# Patient Record
Sex: Female | Born: 1935 | Race: White | Hispanic: No | Smoking: Never smoker
Health system: Southern US, Community
[De-identification: ages and names within clinical notes are randomized; demographics above are authoritative.]

## PROBLEM LIST (undated history)

## (undated) DIAGNOSIS — I10 Essential (primary) hypertension: Secondary | ICD-10-CM

## (undated) DIAGNOSIS — M549 Dorsalgia, unspecified: Secondary | ICD-10-CM

## (undated) DIAGNOSIS — M1990 Unspecified osteoarthritis, unspecified site: Secondary | ICD-10-CM

## (undated) DIAGNOSIS — E7800 Pure hypercholesterolemia, unspecified: Secondary | ICD-10-CM

## (undated) DIAGNOSIS — G8929 Other chronic pain: Secondary | ICD-10-CM

## (undated) DIAGNOSIS — R112 Nausea with vomiting, unspecified: Secondary | ICD-10-CM

## (undated) DIAGNOSIS — I639 Cerebral infarction, unspecified: Secondary | ICD-10-CM

## (undated) DIAGNOSIS — Z87442 Personal history of urinary calculi: Secondary | ICD-10-CM

## (undated) DIAGNOSIS — I447 Left bundle-branch block, unspecified: Secondary | ICD-10-CM

## (undated) DIAGNOSIS — R011 Cardiac murmur, unspecified: Secondary | ICD-10-CM

## (undated) DIAGNOSIS — J189 Pneumonia, unspecified organism: Secondary | ICD-10-CM

## (undated) DIAGNOSIS — C50912 Malignant neoplasm of unspecified site of left female breast: Secondary | ICD-10-CM

## (undated) DIAGNOSIS — I509 Heart failure, unspecified: Secondary | ICD-10-CM

## (undated) DIAGNOSIS — Z9889 Other specified postprocedural states: Secondary | ICD-10-CM

## (undated) HISTORY — PX: CATARACT EXTRACTION W/ INTRAOCULAR LENS IMPLANT: SHX1309

## (undated) HISTORY — PX: BACK SURGERY: SHX140

## (undated) HISTORY — PX: APPENDECTOMY: SHX54

---

## 1990-03-18 DIAGNOSIS — C50912 Malignant neoplasm of unspecified site of left female breast: Secondary | ICD-10-CM

## 1990-03-18 HISTORY — PX: MASTECTOMY: SHX3

## 1990-03-18 HISTORY — DX: Malignant neoplasm of unspecified site of left female breast: C50.912

## 1998-02-07 ENCOUNTER — Other Ambulatory Visit: Admission: RE | Admit: 1998-02-07 | Discharge: 1998-02-07 | Payer: Self-pay | Admitting: Internal Medicine

## 1998-10-16 ENCOUNTER — Encounter: Payer: Self-pay | Admitting: Internal Medicine

## 1998-10-16 ENCOUNTER — Ambulatory Visit (HOSPITAL_COMMUNITY): Admission: RE | Admit: 1998-10-16 | Discharge: 1998-10-16 | Payer: Self-pay | Admitting: Internal Medicine

## 1999-05-14 ENCOUNTER — Encounter: Admission: RE | Admit: 1999-05-14 | Discharge: 1999-05-14 | Payer: Self-pay | Admitting: *Deleted

## 2000-04-11 ENCOUNTER — Other Ambulatory Visit: Admission: RE | Admit: 2000-04-11 | Discharge: 2000-04-11 | Payer: Self-pay | Admitting: *Deleted

## 2000-05-20 ENCOUNTER — Encounter: Admission: RE | Admit: 2000-05-20 | Discharge: 2000-05-20 | Payer: Self-pay | Admitting: *Deleted

## 2001-06-09 ENCOUNTER — Encounter: Admission: RE | Admit: 2001-06-09 | Discharge: 2001-06-09 | Payer: Self-pay | Admitting: *Deleted

## 2002-06-11 ENCOUNTER — Encounter: Admission: RE | Admit: 2002-06-11 | Discharge: 2002-06-11 | Payer: Self-pay | Admitting: Internal Medicine

## 2002-06-11 ENCOUNTER — Encounter: Payer: Self-pay | Admitting: Internal Medicine

## 2003-06-28 ENCOUNTER — Encounter: Admission: RE | Admit: 2003-06-28 | Discharge: 2003-06-28 | Payer: Self-pay | Admitting: Internal Medicine

## 2004-02-22 ENCOUNTER — Ambulatory Visit (HOSPITAL_COMMUNITY): Admission: RE | Admit: 2004-02-22 | Discharge: 2004-02-22 | Payer: Self-pay | Admitting: Cardiology

## 2006-07-02 ENCOUNTER — Encounter: Admission: RE | Admit: 2006-07-02 | Discharge: 2006-07-02 | Payer: Self-pay | Admitting: Internal Medicine

## 2007-03-17 ENCOUNTER — Encounter: Admission: RE | Admit: 2007-03-17 | Discharge: 2007-03-17 | Payer: Self-pay | Admitting: Internal Medicine

## 2007-10-02 ENCOUNTER — Encounter: Admission: RE | Admit: 2007-10-02 | Discharge: 2007-10-02 | Payer: Self-pay | Admitting: Internal Medicine

## 2008-07-11 ENCOUNTER — Encounter: Admission: RE | Admit: 2008-07-11 | Discharge: 2008-07-11 | Payer: Self-pay | Admitting: Internal Medicine

## 2008-07-20 ENCOUNTER — Encounter: Admission: RE | Admit: 2008-07-20 | Discharge: 2008-07-20 | Payer: Self-pay | Admitting: Internal Medicine

## 2008-11-17 ENCOUNTER — Encounter: Admission: RE | Admit: 2008-11-17 | Discharge: 2008-11-17 | Payer: Self-pay | Admitting: Internal Medicine

## 2009-02-02 ENCOUNTER — Encounter: Admission: RE | Admit: 2009-02-02 | Discharge: 2009-02-02 | Payer: Self-pay | Admitting: Internal Medicine

## 2010-04-09 ENCOUNTER — Encounter: Payer: Self-pay | Admitting: Internal Medicine

## 2010-05-23 ENCOUNTER — Other Ambulatory Visit: Payer: Self-pay | Admitting: Internal Medicine

## 2010-05-23 DIAGNOSIS — Z9010 Acquired absence of unspecified breast and nipple: Secondary | ICD-10-CM

## 2010-05-23 DIAGNOSIS — Z1231 Encounter for screening mammogram for malignant neoplasm of breast: Secondary | ICD-10-CM

## 2010-05-29 ENCOUNTER — Ambulatory Visit
Admission: RE | Admit: 2010-05-29 | Discharge: 2010-05-29 | Disposition: A | Discharge status: Home / Self Care | Payer: Medicare Other | Source: Ambulatory Visit | Attending: Internal Medicine | Admitting: Internal Medicine

## 2010-05-29 DIAGNOSIS — Z9010 Acquired absence of unspecified breast and nipple: Secondary | ICD-10-CM

## 2010-05-29 DIAGNOSIS — Z1231 Encounter for screening mammogram for malignant neoplasm of breast: Secondary | ICD-10-CM

## 2011-05-15 DIAGNOSIS — I10 Essential (primary) hypertension: Secondary | ICD-10-CM | POA: Diagnosis not present

## 2011-05-15 DIAGNOSIS — E782 Mixed hyperlipidemia: Secondary | ICD-10-CM | POA: Diagnosis not present

## 2011-05-20 ENCOUNTER — Other Ambulatory Visit: Payer: Self-pay | Admitting: Internal Medicine

## 2011-05-20 DIAGNOSIS — Z1231 Encounter for screening mammogram for malignant neoplasm of breast: Secondary | ICD-10-CM

## 2011-05-20 DIAGNOSIS — Z9012 Acquired absence of left breast and nipple: Secondary | ICD-10-CM

## 2011-05-21 DIAGNOSIS — E782 Mixed hyperlipidemia: Secondary | ICD-10-CM | POA: Diagnosis not present

## 2011-05-21 DIAGNOSIS — IMO0002 Reserved for concepts with insufficient information to code with codable children: Secondary | ICD-10-CM | POA: Diagnosis not present

## 2011-05-21 DIAGNOSIS — I10 Essential (primary) hypertension: Secondary | ICD-10-CM | POA: Diagnosis not present

## 2011-11-19 DIAGNOSIS — I10 Essential (primary) hypertension: Secondary | ICD-10-CM | POA: Diagnosis not present

## 2011-11-19 DIAGNOSIS — Z79899 Other long term (current) drug therapy: Secondary | ICD-10-CM | POA: Diagnosis not present

## 2011-11-19 DIAGNOSIS — M549 Dorsalgia, unspecified: Secondary | ICD-10-CM | POA: Diagnosis not present

## 2011-11-19 DIAGNOSIS — E782 Mixed hyperlipidemia: Secondary | ICD-10-CM | POA: Diagnosis not present

## 2011-11-19 DIAGNOSIS — IMO0002 Reserved for concepts with insufficient information to code with codable children: Secondary | ICD-10-CM | POA: Diagnosis not present

## 2011-11-26 DIAGNOSIS — I052 Rheumatic mitral stenosis with insufficiency: Secondary | ICD-10-CM | POA: Diagnosis not present

## 2011-11-26 DIAGNOSIS — I10 Essential (primary) hypertension: Secondary | ICD-10-CM | POA: Diagnosis not present

## 2011-11-26 DIAGNOSIS — Z23 Encounter for immunization: Secondary | ICD-10-CM | POA: Diagnosis not present

## 2011-11-26 DIAGNOSIS — Z780 Asymptomatic menopausal state: Secondary | ICD-10-CM | POA: Diagnosis not present

## 2011-11-26 DIAGNOSIS — E782 Mixed hyperlipidemia: Secondary | ICD-10-CM | POA: Diagnosis not present

## 2012-01-01 ENCOUNTER — Ambulatory Visit
Admission: RE | Admit: 2012-01-01 | Discharge: 2012-01-01 | Disposition: A | Discharge status: Home / Self Care | Payer: Medicare Other | Source: Ambulatory Visit | Attending: Internal Medicine | Admitting: Internal Medicine

## 2012-01-01 DIAGNOSIS — Z1231 Encounter for screening mammogram for malignant neoplasm of breast: Secondary | ICD-10-CM

## 2012-01-01 DIAGNOSIS — Z9012 Acquired absence of left breast and nipple: Secondary | ICD-10-CM

## 2012-01-22 DIAGNOSIS — H43399 Other vitreous opacities, unspecified eye: Secondary | ICD-10-CM | POA: Diagnosis not present

## 2012-01-22 DIAGNOSIS — H35369 Drusen (degenerative) of macula, unspecified eye: Secondary | ICD-10-CM | POA: Diagnosis not present

## 2012-01-22 DIAGNOSIS — H524 Presbyopia: Secondary | ICD-10-CM | POA: Diagnosis not present

## 2012-01-22 DIAGNOSIS — H2510 Age-related nuclear cataract, unspecified eye: Secondary | ICD-10-CM | POA: Diagnosis not present

## 2012-01-22 DIAGNOSIS — Z961 Presence of intraocular lens: Secondary | ICD-10-CM | POA: Diagnosis not present

## 2012-03-18 HISTORY — PX: FIXATION KYPHOPLASTY THORACIC SPINE: SHX1643

## 2012-03-24 DIAGNOSIS — IMO0001 Reserved for inherently not codable concepts without codable children: Secondary | ICD-10-CM | POA: Diagnosis not present

## 2012-03-24 DIAGNOSIS — M545 Low back pain: Secondary | ICD-10-CM | POA: Diagnosis not present

## 2012-03-24 DIAGNOSIS — M1990 Unspecified osteoarthritis, unspecified site: Secondary | ICD-10-CM | POA: Diagnosis not present

## 2012-04-06 ENCOUNTER — Other Ambulatory Visit: Payer: Self-pay | Admitting: Internal Medicine

## 2012-04-06 DIAGNOSIS — IMO0002 Reserved for concepts with insufficient information to code with codable children: Secondary | ICD-10-CM | POA: Diagnosis not present

## 2012-04-06 DIAGNOSIS — M549 Dorsalgia, unspecified: Secondary | ICD-10-CM

## 2012-04-10 ENCOUNTER — Ambulatory Visit
Admission: RE | Admit: 2012-04-10 | Discharge: 2012-04-10 | Disposition: A | Discharge status: Home / Self Care | Payer: Medicare Other | Source: Ambulatory Visit | Attending: Internal Medicine | Admitting: Internal Medicine

## 2012-04-10 DIAGNOSIS — M8448XA Pathological fracture, other site, initial encounter for fracture: Secondary | ICD-10-CM | POA: Diagnosis not present

## 2012-04-10 DIAGNOSIS — M549 Dorsalgia, unspecified: Secondary | ICD-10-CM

## 2012-04-10 DIAGNOSIS — M4804 Spinal stenosis, thoracic region: Secondary | ICD-10-CM | POA: Diagnosis not present

## 2012-04-13 ENCOUNTER — Other Ambulatory Visit: Payer: Medicare Other

## 2012-04-16 DIAGNOSIS — IMO0001 Reserved for inherently not codable concepts without codable children: Secondary | ICD-10-CM | POA: Diagnosis not present

## 2012-04-16 DIAGNOSIS — M545 Low back pain: Secondary | ICD-10-CM | POA: Diagnosis not present

## 2012-04-18 ENCOUNTER — Ambulatory Visit
Admission: RE | Admit: 2012-04-18 | Discharge: 2012-04-18 | Disposition: A | Discharge status: Home / Self Care | Payer: Medicare Other | Source: Ambulatory Visit | Attending: Internal Medicine | Admitting: Internal Medicine

## 2012-04-21 DIAGNOSIS — M810 Age-related osteoporosis without current pathological fracture: Secondary | ICD-10-CM | POA: Diagnosis not present

## 2012-04-21 DIAGNOSIS — M545 Low back pain: Secondary | ICD-10-CM | POA: Diagnosis not present

## 2012-05-01 ENCOUNTER — Other Ambulatory Visit (HOSPITAL_COMMUNITY): Payer: Self-pay | Admitting: Internal Medicine

## 2012-05-01 DIAGNOSIS — IMO0002 Reserved for concepts with insufficient information to code with codable children: Secondary | ICD-10-CM

## 2012-05-06 ENCOUNTER — Ambulatory Visit (HOSPITAL_COMMUNITY)
Admission: RE | Admit: 2012-05-06 | Discharge: 2012-05-06 | Disposition: A | Discharge status: Home / Self Care | Payer: Medicare Other | Source: Ambulatory Visit | Attending: Internal Medicine | Admitting: Internal Medicine

## 2012-05-06 DIAGNOSIS — M8448XA Pathological fracture, other site, initial encounter for fracture: Secondary | ICD-10-CM | POA: Diagnosis not present

## 2012-05-08 ENCOUNTER — Other Ambulatory Visit: Payer: Self-pay | Admitting: Neurology

## 2012-05-11 ENCOUNTER — Other Ambulatory Visit (HOSPITAL_COMMUNITY): Payer: Self-pay | Admitting: Interventional Radiology

## 2012-05-11 ENCOUNTER — Ambulatory Visit (HOSPITAL_COMMUNITY): Payer: Medicare Other

## 2012-05-11 ENCOUNTER — Other Ambulatory Visit (HOSPITAL_COMMUNITY): Payer: Self-pay | Admitting: Internal Medicine

## 2012-05-11 DIAGNOSIS — IMO0002 Reserved for concepts with insufficient information to code with codable children: Secondary | ICD-10-CM

## 2012-05-12 ENCOUNTER — Other Ambulatory Visit: Payer: Self-pay | Admitting: Radiology

## 2012-05-15 ENCOUNTER — Ambulatory Visit (HOSPITAL_COMMUNITY)
Admission: RE | Admit: 2012-05-15 | Discharge: 2012-05-15 | Disposition: A | Discharge status: Home / Self Care | Payer: Medicare Other | Source: Ambulatory Visit | Attending: Interventional Radiology | Admitting: Interventional Radiology

## 2012-05-15 ENCOUNTER — Other Ambulatory Visit (HOSPITAL_COMMUNITY): Payer: Self-pay | Admitting: Interventional Radiology

## 2012-05-15 ENCOUNTER — Encounter (HOSPITAL_COMMUNITY): Payer: Self-pay

## 2012-05-15 DIAGNOSIS — Z853 Personal history of malignant neoplasm of breast: Secondary | ICD-10-CM | POA: Insufficient documentation

## 2012-05-15 DIAGNOSIS — M8448XA Pathological fracture, other site, initial encounter for fracture: Secondary | ICD-10-CM | POA: Insufficient documentation

## 2012-05-15 DIAGNOSIS — I10 Essential (primary) hypertension: Secondary | ICD-10-CM | POA: Diagnosis not present

## 2012-05-15 HISTORY — DX: Essential (primary) hypertension: I10

## 2012-05-15 LAB — CBC WITH DIFFERENTIAL/PLATELET
Basophils Absolute: 0 10*3/uL (ref 0.0–0.1)
Basophils Relative: 0 % (ref 0–1)
HCT: 35.5 % — ABNORMAL LOW (ref 36.0–46.0)
MCHC: 36.9 g/dL — ABNORMAL HIGH (ref 30.0–36.0)
Monocytes Absolute: 0.7 10*3/uL (ref 0.1–1.0)
Neutro Abs: 4.1 10*3/uL (ref 1.7–7.7)
Neutrophils Relative %: 64 % (ref 43–77)
Platelets: 243 10*3/uL (ref 150–400)
RDW: 13.9 % (ref 11.5–15.5)

## 2012-05-15 LAB — APTT: aPTT: 22 seconds — ABNORMAL LOW (ref 24–37)

## 2012-05-15 LAB — COMPREHENSIVE METABOLIC PANEL
AST: 21 U/L (ref 0–37)
Albumin: 3.9 g/dL (ref 3.5–5.2)
Calcium: 9.5 mg/dL (ref 8.4–10.5)
Chloride: 104 mEq/L (ref 96–112)
Creatinine, Ser: 0.87 mg/dL (ref 0.50–1.10)
Total Bilirubin: 0.9 mg/dL (ref 0.3–1.2)

## 2012-05-15 LAB — PROTIME-INR
INR: 0.89 (ref 0.00–1.49)
Prothrombin Time: 12 seconds (ref 11.6–15.2)

## 2012-05-15 MED ORDER — LORAZEPAM 2 MG/ML IJ SOLN
INTRAMUSCULAR | Status: DC | PRN
  Administered 2012-05-15: 1 mg via INTRAVENOUS

## 2012-05-15 MED ORDER — IOHEXOL 300 MG/ML  SOLN
50.0000 mL | Freq: Once | INTRAMUSCULAR | Status: AC | PRN
  Administered 2012-05-15: 5 mL

## 2012-05-15 MED ORDER — SODIUM CHLORIDE 0.9 % IV SOLN: Freq: Once | INTRAVENOUS | Status: DC

## 2012-05-15 MED ORDER — GELATIN ABSORBABLE 12-7 MM EX MISC
CUTANEOUS | Status: AC
  Filled 2012-05-15: qty 1

## 2012-05-15 MED ORDER — FENTANYL CITRATE 0.05 MG/ML IJ SOLN
INTRAMUSCULAR | Status: DC | PRN
  Administered 2012-05-15 (×5): 25 ug via INTRAVENOUS

## 2012-05-15 MED ORDER — SODIUM CHLORIDE 0.9 % IV SOLN: INTRAVENOUS | Status: AC

## 2012-05-15 MED ORDER — TOBRAMYCIN SULFATE 1.2 G IJ SOLR
INTRAMUSCULAR | Status: AC
  Filled 2012-05-15: qty 1.2

## 2012-05-15 MED ORDER — MIDAZOLAM HCL 2 MG/2ML IJ SOLN
INTRAMUSCULAR | Status: DC | PRN
  Administered 2012-05-15 (×2): 1 mg via INTRAVENOUS

## 2012-05-15 MED ORDER — CEFAZOLIN SODIUM 1-5 GM-% IV SOLN
1.0000 g | Freq: Once | INTRAVENOUS | Status: AC
  Administered 2012-05-15: 1 g via INTRAVENOUS
  Filled 2012-05-15: qty 50

## 2012-05-15 MED ORDER — MIDAZOLAM HCL 2 MG/2ML IJ SOLN
INTRAMUSCULAR | Status: AC
  Filled 2012-05-15: qty 4

## 2012-05-15 MED ORDER — FENTANYL CITRATE 0.05 MG/ML IJ SOLN
INTRAMUSCULAR | Status: AC
  Filled 2012-05-15: qty 4

## 2012-05-15 NOTE — Procedures (Signed)
S/P T12 and L1 VP for painful compression fractures

## 2012-05-15 NOTE — H&P (Signed)
Haley Costa is an 79 y.o. female.   Chief Complaint: back pain x 6 weeks after bending over at home MRI reveals acute fxs at Thoracic 12 and Lumbar 1 Scheduled now for Kyphoplasty T12 and L1 HPI: Breast Ca; HTN  Past Medical History  Diagnosis Date  . Cancer     Breast  . Hypertension     Past Surgical History  Procedure Laterality Date  . Mastectomy    . Appendectomy      History reviewed. No pertinent family history. Social History:  reports that she has never smoked. She does not have any smokeless tobacco history on file. Her alcohol and drug histories are not on file.  Allergies:  Allergies  Allergen Reactions  . Chlorthalidone   . Lisinopril   . Persantine (Dipyridamole)      (Not in a hospital admission)  Results for orders placed during the hospital encounter of 05/15/12 (from the past 48 hour(s))  CBC WITH DIFFERENTIAL     Status: Abnormal   Collection Time    05/15/12 11:17 AM      Result Value Range   WBC 6.4  4.0 - 10.5 K/uL   RBC 3.96  3.87 - 5.11 MIL/uL   Hemoglobin 13.1  12.0 - 15.0 g/dL   HCT 16.1 (*) 09.6 - 04.5 %   MCV 89.6  78.0 - 100.0 fL   MCH 33.1  26.0 - 34.0 pg   MCHC 36.9 (*) 30.0 - 36.0 g/dL   RDW 40.9  81.1 - 91.4 %   Platelets 243  150 - 400 K/uL   Neutrophils Relative 64  43 - 77 %   Neutro Abs 4.1  1.7 - 7.7 K/uL   Lymphocytes Relative 25  12 - 46 %   Lymphs Abs 1.6  0.7 - 4.0 K/uL   Monocytes Relative 11  3 - 12 %   Monocytes Absolute 0.7  0.1 - 1.0 K/uL   Eosinophils Relative 0  0 - 5 %   Eosinophils Absolute 0.0  0.0 - 0.7 K/uL   Basophils Relative 0  0 - 1 %   Basophils Absolute 0.0  0.0 - 0.1 K/uL   No results found.  Review of Systems  Constitutional: Negative for fever and weight loss.  Respiratory: Negative for cough.   Cardiovascular: Negative for chest pain.  Gastrointestinal: Negative for nausea and vomiting.  Musculoskeletal: Positive for back pain.  Neurological: Positive for weakness. Negative for  headaches.    Blood pressure 165/74, pulse 87, temperature 98.5 F (36.9 C), temperature source Oral, resp. rate 18, height 5\' 4"  (1.626 m), weight 150 lb (68.04 kg), SpO2 97.00%. Physical Exam  Constitutional: She is oriented to person, place, and time. She appears well-developed and well-nourished.  Cardiovascular: Normal rate, regular rhythm and normal heart sounds.   No murmur heard. Respiratory: Effort normal and breath sounds normal. She has no wheezes.  GI: There is no tenderness.  Musculoskeletal: Normal range of motion.  Neurological: She is oriented to person, place, and time. No cranial nerve deficit. Coordination normal.  Skin: Skin is warm and dry.  Psychiatric: She has a normal mood and affect. Her behavior is normal. Judgment and thought content normal.     Assessment/Plan Back pain after bending at home MRI shows acute fx T12 and L1 Scheduled now for KP Pt aware of procedure benefits and risks and agreeable to proceed Consent signed and in chart  Larie Mathes A 05/15/2012, 12:08 PM

## 2012-05-18 ENCOUNTER — Other Ambulatory Visit (HOSPITAL_COMMUNITY): Payer: Self-pay | Admitting: Interventional Radiology

## 2012-05-18 DIAGNOSIS — IMO0002 Reserved for concepts with insufficient information to code with codable children: Secondary | ICD-10-CM

## 2012-05-21 ENCOUNTER — Other Ambulatory Visit (HOSPITAL_COMMUNITY): Payer: Self-pay | Admitting: Interventional Radiology

## 2012-05-21 DIAGNOSIS — IMO0002 Reserved for concepts with insufficient information to code with codable children: Secondary | ICD-10-CM

## 2012-05-29 ENCOUNTER — Telehealth (HOSPITAL_COMMUNITY): Payer: Self-pay

## 2012-06-01 ENCOUNTER — Ambulatory Visit (HOSPITAL_COMMUNITY): Payer: Medicare Other

## 2012-06-10 ENCOUNTER — Ambulatory Visit (HOSPITAL_COMMUNITY)
Admission: RE | Admit: 2012-06-10 | Discharge: 2012-06-10 | Disposition: A | Discharge status: Home / Self Care | Payer: Medicare Other | Source: Ambulatory Visit | Attending: Interventional Radiology | Admitting: Interventional Radiology

## 2012-06-10 DIAGNOSIS — IMO0002 Reserved for concepts with insufficient information to code with codable children: Secondary | ICD-10-CM

## 2012-06-10 DIAGNOSIS — M8448XA Pathological fracture, other site, initial encounter for fracture: Secondary | ICD-10-CM | POA: Diagnosis not present

## 2012-06-10 DIAGNOSIS — Z5189 Encounter for other specified aftercare: Secondary | ICD-10-CM | POA: Diagnosis not present

## 2012-08-06 DIAGNOSIS — M47817 Spondylosis without myelopathy or radiculopathy, lumbosacral region: Secondary | ICD-10-CM | POA: Diagnosis not present

## 2012-08-06 DIAGNOSIS — M810 Age-related osteoporosis without current pathological fracture: Secondary | ICD-10-CM | POA: Diagnosis not present

## 2012-08-06 DIAGNOSIS — I10 Essential (primary) hypertension: Secondary | ICD-10-CM | POA: Diagnosis not present

## 2012-08-06 DIAGNOSIS — M549 Dorsalgia, unspecified: Secondary | ICD-10-CM | POA: Diagnosis not present

## 2012-08-26 DIAGNOSIS — M810 Age-related osteoporosis without current pathological fracture: Secondary | ICD-10-CM | POA: Diagnosis not present

## 2012-08-26 DIAGNOSIS — M549 Dorsalgia, unspecified: Secondary | ICD-10-CM | POA: Diagnosis not present

## 2012-08-26 DIAGNOSIS — IMO0002 Reserved for concepts with insufficient information to code with codable children: Secondary | ICD-10-CM | POA: Diagnosis not present

## 2012-08-26 DIAGNOSIS — E782 Mixed hyperlipidemia: Secondary | ICD-10-CM | POA: Diagnosis not present

## 2012-08-26 DIAGNOSIS — I10 Essential (primary) hypertension: Secondary | ICD-10-CM | POA: Diagnosis not present

## 2013-01-27 DIAGNOSIS — H353190 Nonexudative age-related macular degeneration, unspecified eye, stage unspecified: Secondary | ICD-10-CM | POA: Diagnosis not present

## 2013-01-27 DIAGNOSIS — H43399 Other vitreous opacities, unspecified eye: Secondary | ICD-10-CM | POA: Diagnosis not present

## 2013-01-27 DIAGNOSIS — H3560 Retinal hemorrhage, unspecified eye: Secondary | ICD-10-CM | POA: Diagnosis not present

## 2013-01-27 DIAGNOSIS — H2510 Age-related nuclear cataract, unspecified eye: Secondary | ICD-10-CM | POA: Diagnosis not present

## 2013-02-03 ENCOUNTER — Encounter (INDEPENDENT_AMBULATORY_CARE_PROVIDER_SITE_OTHER): Payer: Medicare Other | Admitting: Ophthalmology

## 2013-02-03 DIAGNOSIS — H43819 Vitreous degeneration, unspecified eye: Secondary | ICD-10-CM

## 2013-02-03 DIAGNOSIS — H3530 Unspecified macular degeneration: Secondary | ICD-10-CM

## 2013-02-03 DIAGNOSIS — I10 Essential (primary) hypertension: Secondary | ICD-10-CM

## 2013-02-03 DIAGNOSIS — H2510 Age-related nuclear cataract, unspecified eye: Secondary | ICD-10-CM

## 2013-02-03 DIAGNOSIS — H35039 Hypertensive retinopathy, unspecified eye: Secondary | ICD-10-CM | POA: Diagnosis not present

## 2013-03-03 DIAGNOSIS — R5381 Other malaise: Secondary | ICD-10-CM | POA: Diagnosis not present

## 2013-03-03 DIAGNOSIS — E782 Mixed hyperlipidemia: Secondary | ICD-10-CM | POA: Diagnosis not present

## 2013-03-03 DIAGNOSIS — I10 Essential (primary) hypertension: Secondary | ICD-10-CM | POA: Diagnosis not present

## 2013-03-03 DIAGNOSIS — N390 Urinary tract infection, site not specified: Secondary | ICD-10-CM | POA: Diagnosis not present

## 2013-03-03 DIAGNOSIS — M810 Age-related osteoporosis without current pathological fracture: Secondary | ICD-10-CM | POA: Diagnosis not present

## 2013-03-03 DIAGNOSIS — Z1331 Encounter for screening for depression: Secondary | ICD-10-CM | POA: Diagnosis not present

## 2013-03-08 DIAGNOSIS — Z23 Encounter for immunization: Secondary | ICD-10-CM | POA: Diagnosis not present

## 2013-03-08 DIAGNOSIS — E782 Mixed hyperlipidemia: Secondary | ICD-10-CM | POA: Diagnosis not present

## 2013-03-08 DIAGNOSIS — M1990 Unspecified osteoarthritis, unspecified site: Secondary | ICD-10-CM | POA: Diagnosis not present

## 2013-03-08 DIAGNOSIS — I10 Essential (primary) hypertension: Secondary | ICD-10-CM | POA: Diagnosis not present

## 2013-03-08 DIAGNOSIS — IMO0002 Reserved for concepts with insufficient information to code with codable children: Secondary | ICD-10-CM | POA: Diagnosis not present

## 2013-04-07 ENCOUNTER — Encounter (INDEPENDENT_AMBULATORY_CARE_PROVIDER_SITE_OTHER): Payer: Medicare Other | Admitting: Ophthalmology

## 2013-04-07 DIAGNOSIS — H35039 Hypertensive retinopathy, unspecified eye: Secondary | ICD-10-CM

## 2013-04-07 DIAGNOSIS — H3530 Unspecified macular degeneration: Secondary | ICD-10-CM

## 2013-04-07 DIAGNOSIS — H43819 Vitreous degeneration, unspecified eye: Secondary | ICD-10-CM

## 2013-04-07 DIAGNOSIS — H3560 Retinal hemorrhage, unspecified eye: Secondary | ICD-10-CM

## 2013-04-07 DIAGNOSIS — I10 Essential (primary) hypertension: Secondary | ICD-10-CM

## 2013-09-01 DIAGNOSIS — E782 Mixed hyperlipidemia: Secondary | ICD-10-CM | POA: Diagnosis not present

## 2013-09-08 DIAGNOSIS — N390 Urinary tract infection, site not specified: Secondary | ICD-10-CM | POA: Diagnosis not present

## 2013-09-08 DIAGNOSIS — E782 Mixed hyperlipidemia: Secondary | ICD-10-CM | POA: Diagnosis not present

## 2013-09-08 DIAGNOSIS — I10 Essential (primary) hypertension: Secondary | ICD-10-CM | POA: Diagnosis not present

## 2013-09-08 DIAGNOSIS — M549 Dorsalgia, unspecified: Secondary | ICD-10-CM | POA: Diagnosis not present

## 2013-10-05 ENCOUNTER — Ambulatory Visit (INDEPENDENT_AMBULATORY_CARE_PROVIDER_SITE_OTHER): Payer: Medicare Other | Admitting: Ophthalmology

## 2013-10-05 DIAGNOSIS — H35039 Hypertensive retinopathy, unspecified eye: Secondary | ICD-10-CM

## 2013-10-05 DIAGNOSIS — H2510 Age-related nuclear cataract, unspecified eye: Secondary | ICD-10-CM

## 2013-10-05 DIAGNOSIS — H3530 Unspecified macular degeneration: Secondary | ICD-10-CM | POA: Diagnosis not present

## 2013-10-05 DIAGNOSIS — I10 Essential (primary) hypertension: Secondary | ICD-10-CM

## 2013-10-05 DIAGNOSIS — H43819 Vitreous degeneration, unspecified eye: Secondary | ICD-10-CM | POA: Diagnosis not present

## 2014-03-30 DIAGNOSIS — M5136 Other intervertebral disc degeneration, lumbar region: Secondary | ICD-10-CM | POA: Diagnosis not present

## 2014-03-30 DIAGNOSIS — I10 Essential (primary) hypertension: Secondary | ICD-10-CM | POA: Diagnosis not present

## 2014-03-30 DIAGNOSIS — N390 Urinary tract infection, site not specified: Secondary | ICD-10-CM | POA: Diagnosis not present

## 2014-03-30 DIAGNOSIS — Z0000 Encounter for general adult medical examination without abnormal findings: Secondary | ICD-10-CM | POA: Diagnosis not present

## 2014-03-30 DIAGNOSIS — Z1389 Encounter for screening for other disorder: Secondary | ICD-10-CM | POA: Diagnosis not present

## 2014-03-30 DIAGNOSIS — E782 Mixed hyperlipidemia: Secondary | ICD-10-CM | POA: Diagnosis not present

## 2014-04-06 DIAGNOSIS — M150 Primary generalized (osteo)arthritis: Secondary | ICD-10-CM | POA: Diagnosis not present

## 2014-04-06 DIAGNOSIS — M810 Age-related osteoporosis without current pathological fracture: Secondary | ICD-10-CM | POA: Diagnosis not present

## 2014-04-06 DIAGNOSIS — I517 Cardiomegaly: Secondary | ICD-10-CM | POA: Diagnosis not present

## 2014-04-06 DIAGNOSIS — I10 Essential (primary) hypertension: Secondary | ICD-10-CM | POA: Diagnosis not present

## 2014-04-06 DIAGNOSIS — E782 Mixed hyperlipidemia: Secondary | ICD-10-CM | POA: Diagnosis not present

## 2014-06-14 ENCOUNTER — Other Ambulatory Visit: Payer: Self-pay

## 2014-06-14 DIAGNOSIS — Z1231 Encounter for screening mammogram for malignant neoplasm of breast: Secondary | ICD-10-CM

## 2014-06-21 ENCOUNTER — Ambulatory Visit
Admission: RE | Admit: 2014-06-21 | Discharge: 2014-06-21 | Disposition: A | Discharge status: Home / Self Care | Payer: Medicare Other | Source: Ambulatory Visit

## 2014-06-21 ENCOUNTER — Other Ambulatory Visit: Payer: Self-pay

## 2014-06-21 DIAGNOSIS — Z1231 Encounter for screening mammogram for malignant neoplasm of breast: Secondary | ICD-10-CM | POA: Diagnosis not present

## 2014-10-07 ENCOUNTER — Ambulatory Visit (INDEPENDENT_AMBULATORY_CARE_PROVIDER_SITE_OTHER): Payer: Medicare Other | Admitting: Ophthalmology

## 2014-10-07 DIAGNOSIS — H43813 Vitreous degeneration, bilateral: Secondary | ICD-10-CM

## 2014-10-07 DIAGNOSIS — I10 Essential (primary) hypertension: Secondary | ICD-10-CM | POA: Diagnosis not present

## 2014-10-07 DIAGNOSIS — H3531 Nonexudative age-related macular degeneration: Secondary | ICD-10-CM | POA: Diagnosis not present

## 2014-10-07 DIAGNOSIS — H35033 Hypertensive retinopathy, bilateral: Secondary | ICD-10-CM | POA: Diagnosis not present

## 2015-09-29 ENCOUNTER — Ambulatory Visit: Payer: Medicare Other

## 2015-09-29 DIAGNOSIS — B0239 Other herpes zoster eye disease: Secondary | ICD-10-CM | POA: Diagnosis not present

## 2015-10-02 DIAGNOSIS — B0059 Other herpesviral disease of eye: Secondary | ICD-10-CM | POA: Diagnosis not present

## 2015-10-02 DIAGNOSIS — B029 Zoster without complications: Secondary | ICD-10-CM | POA: Diagnosis not present

## 2015-10-03 DIAGNOSIS — B029 Zoster without complications: Secondary | ICD-10-CM | POA: Diagnosis not present

## 2015-10-10 ENCOUNTER — Ambulatory Visit (INDEPENDENT_AMBULATORY_CARE_PROVIDER_SITE_OTHER): Payer: Medicare Other | Admitting: Ophthalmology

## 2015-11-02 ENCOUNTER — Ambulatory Visit (INDEPENDENT_AMBULATORY_CARE_PROVIDER_SITE_OTHER): Payer: Medicare Other | Admitting: Ophthalmology

## 2015-11-02 DIAGNOSIS — H43813 Vitreous degeneration, bilateral: Secondary | ICD-10-CM | POA: Diagnosis not present

## 2015-11-02 DIAGNOSIS — H353111 Nonexudative age-related macular degeneration, right eye, early dry stage: Secondary | ICD-10-CM | POA: Diagnosis not present

## 2015-11-02 DIAGNOSIS — H353122 Nonexudative age-related macular degeneration, left eye, intermediate dry stage: Secondary | ICD-10-CM

## 2015-11-02 DIAGNOSIS — I10 Essential (primary) hypertension: Secondary | ICD-10-CM

## 2015-11-02 DIAGNOSIS — H35033 Hypertensive retinopathy, bilateral: Secondary | ICD-10-CM | POA: Diagnosis not present

## 2015-11-09 ENCOUNTER — Encounter (INDEPENDENT_AMBULATORY_CARE_PROVIDER_SITE_OTHER): Payer: Medicare Other | Admitting: Ophthalmology

## 2015-11-09 DIAGNOSIS — H43813 Vitreous degeneration, bilateral: Secondary | ICD-10-CM

## 2015-11-09 DIAGNOSIS — I10 Essential (primary) hypertension: Secondary | ICD-10-CM

## 2015-11-09 DIAGNOSIS — H2513 Age-related nuclear cataract, bilateral: Secondary | ICD-10-CM

## 2015-11-09 DIAGNOSIS — H35033 Hypertensive retinopathy, bilateral: Secondary | ICD-10-CM

## 2015-11-09 DIAGNOSIS — H353132 Nonexudative age-related macular degeneration, bilateral, intermediate dry stage: Secondary | ICD-10-CM | POA: Diagnosis not present

## 2016-01-09 DIAGNOSIS — I10 Essential (primary) hypertension: Secondary | ICD-10-CM | POA: Diagnosis not present

## 2016-02-09 DIAGNOSIS — I10 Essential (primary) hypertension: Secondary | ICD-10-CM | POA: Diagnosis not present

## 2016-03-13 DIAGNOSIS — I10 Essential (primary) hypertension: Secondary | ICD-10-CM | POA: Diagnosis not present

## 2016-04-19 DIAGNOSIS — I10 Essential (primary) hypertension: Secondary | ICD-10-CM | POA: Diagnosis not present

## 2016-11-06 ENCOUNTER — Ambulatory Visit (INDEPENDENT_AMBULATORY_CARE_PROVIDER_SITE_OTHER): Payer: Medicare Other | Admitting: Ophthalmology

## 2016-12-11 ENCOUNTER — Encounter (INDEPENDENT_AMBULATORY_CARE_PROVIDER_SITE_OTHER): Payer: Medicare Other | Admitting: Ophthalmology

## 2016-12-11 DIAGNOSIS — H2512 Age-related nuclear cataract, left eye: Secondary | ICD-10-CM | POA: Diagnosis not present

## 2016-12-11 DIAGNOSIS — H35033 Hypertensive retinopathy, bilateral: Secondary | ICD-10-CM | POA: Diagnosis not present

## 2016-12-11 DIAGNOSIS — H43813 Vitreous degeneration, bilateral: Secondary | ICD-10-CM | POA: Diagnosis not present

## 2016-12-11 DIAGNOSIS — I10 Essential (primary) hypertension: Secondary | ICD-10-CM | POA: Diagnosis not present

## 2016-12-11 DIAGNOSIS — H353132 Nonexudative age-related macular degeneration, bilateral, intermediate dry stage: Secondary | ICD-10-CM | POA: Diagnosis not present

## 2016-12-19 DIAGNOSIS — I10 Essential (primary) hypertension: Secondary | ICD-10-CM | POA: Diagnosis not present

## 2017-10-09 DIAGNOSIS — I517 Cardiomegaly: Secondary | ICD-10-CM | POA: Diagnosis not present

## 2017-10-09 DIAGNOSIS — M545 Low back pain: Secondary | ICD-10-CM | POA: Diagnosis not present

## 2017-10-09 DIAGNOSIS — I10 Essential (primary) hypertension: Secondary | ICD-10-CM | POA: Diagnosis not present

## 2017-10-09 DIAGNOSIS — Z0000 Encounter for general adult medical examination without abnormal findings: Secondary | ICD-10-CM | POA: Diagnosis not present

## 2017-10-09 DIAGNOSIS — M150 Primary generalized (osteo)arthritis: Secondary | ICD-10-CM | POA: Diagnosis not present

## 2017-10-09 DIAGNOSIS — M810 Age-related osteoporosis without current pathological fracture: Secondary | ICD-10-CM | POA: Diagnosis not present

## 2017-10-09 DIAGNOSIS — E782 Mixed hyperlipidemia: Secondary | ICD-10-CM | POA: Diagnosis not present

## 2017-10-09 DIAGNOSIS — M8088XD Other osteoporosis with current pathological fracture, vertebra(e), subsequent encounter for fracture with routine healing: Secondary | ICD-10-CM | POA: Diagnosis not present

## 2017-10-09 DIAGNOSIS — I340 Nonrheumatic mitral (valve) insufficiency: Secondary | ICD-10-CM | POA: Diagnosis not present

## 2017-10-09 DIAGNOSIS — M546 Pain in thoracic spine: Secondary | ICD-10-CM | POA: Diagnosis not present

## 2017-10-09 DIAGNOSIS — M5136 Other intervertebral disc degeneration, lumbar region: Secondary | ICD-10-CM | POA: Diagnosis not present

## 2017-10-10 DIAGNOSIS — N390 Urinary tract infection, site not specified: Secondary | ICD-10-CM | POA: Diagnosis not present

## 2017-10-16 DIAGNOSIS — M810 Age-related osteoporosis without current pathological fracture: Secondary | ICD-10-CM | POA: Diagnosis not present

## 2017-10-16 DIAGNOSIS — M545 Low back pain: Secondary | ICD-10-CM | POA: Diagnosis not present

## 2017-10-16 DIAGNOSIS — S32030A Wedge compression fracture of third lumbar vertebra, initial encounter for closed fracture: Secondary | ICD-10-CM | POA: Diagnosis not present

## 2017-10-16 DIAGNOSIS — M8008XD Age-related osteoporosis with current pathological fracture, vertebra(e), subsequent encounter for fracture with routine healing: Secondary | ICD-10-CM | POA: Diagnosis not present

## 2017-11-20 ENCOUNTER — Other Ambulatory Visit: Payer: Self-pay

## 2017-11-20 ENCOUNTER — Emergency Department (HOSPITAL_COMMUNITY): Payer: Medicare Other

## 2017-11-20 ENCOUNTER — Inpatient Hospital Stay (HOSPITAL_COMMUNITY)
Admission: EM | Admit: 2017-11-20 | Discharge: 2017-11-25 | DRG: 292 | Disposition: A | Discharge status: Home / Self Care | Payer: Medicare Other | Attending: Internal Medicine | Admitting: Internal Medicine

## 2017-11-20 ENCOUNTER — Encounter (HOSPITAL_COMMUNITY): Payer: Self-pay

## 2017-11-20 DIAGNOSIS — I10 Essential (primary) hypertension: Secondary | ICD-10-CM | POA: Diagnosis present

## 2017-11-20 DIAGNOSIS — I959 Hypotension, unspecified: Secondary | ICD-10-CM | POA: Diagnosis not present

## 2017-11-20 DIAGNOSIS — I5023 Acute on chronic systolic (congestive) heart failure: Secondary | ICD-10-CM | POA: Diagnosis not present

## 2017-11-20 DIAGNOSIS — R0789 Other chest pain: Secondary | ICD-10-CM | POA: Diagnosis not present

## 2017-11-20 DIAGNOSIS — R0602 Shortness of breath: Secondary | ICD-10-CM | POA: Diagnosis not present

## 2017-11-20 DIAGNOSIS — Z853 Personal history of malignant neoplasm of breast: Secondary | ICD-10-CM | POA: Diagnosis not present

## 2017-11-20 DIAGNOSIS — J810 Acute pulmonary edema: Secondary | ICD-10-CM | POA: Diagnosis not present

## 2017-11-20 DIAGNOSIS — R079 Chest pain, unspecified: Secondary | ICD-10-CM | POA: Diagnosis not present

## 2017-11-20 DIAGNOSIS — J90 Pleural effusion, not elsewhere classified: Secondary | ICD-10-CM | POA: Diagnosis not present

## 2017-11-20 DIAGNOSIS — I248 Other forms of acute ischemic heart disease: Secondary | ICD-10-CM | POA: Diagnosis not present

## 2017-11-20 DIAGNOSIS — Z79899 Other long term (current) drug therapy: Secondary | ICD-10-CM

## 2017-11-20 DIAGNOSIS — Z888 Allergy status to other drugs, medicaments and biological substances status: Secondary | ICD-10-CM

## 2017-11-20 DIAGNOSIS — R000 Tachycardia, unspecified: Secondary | ICD-10-CM | POA: Diagnosis not present

## 2017-11-20 DIAGNOSIS — I4510 Unspecified right bundle-branch block: Secondary | ICD-10-CM | POA: Diagnosis not present

## 2017-11-20 DIAGNOSIS — I110 Hypertensive heart disease with heart failure: Secondary | ICD-10-CM | POA: Diagnosis not present

## 2017-11-20 DIAGNOSIS — I429 Cardiomyopathy, unspecified: Secondary | ICD-10-CM | POA: Diagnosis present

## 2017-11-20 DIAGNOSIS — Z7982 Long term (current) use of aspirin: Secondary | ICD-10-CM

## 2017-11-20 DIAGNOSIS — E785 Hyperlipidemia, unspecified: Secondary | ICD-10-CM | POA: Diagnosis present

## 2017-11-20 DIAGNOSIS — R918 Other nonspecific abnormal finding of lung field: Secondary | ICD-10-CM | POA: Diagnosis not present

## 2017-11-20 DIAGNOSIS — I340 Nonrheumatic mitral (valve) insufficiency: Secondary | ICD-10-CM | POA: Diagnosis present

## 2017-11-20 DIAGNOSIS — E876 Hypokalemia: Secondary | ICD-10-CM | POA: Diagnosis present

## 2017-11-20 DIAGNOSIS — I447 Left bundle-branch block, unspecified: Secondary | ICD-10-CM | POA: Diagnosis present

## 2017-11-20 DIAGNOSIS — I509 Heart failure, unspecified: Secondary | ICD-10-CM

## 2017-11-20 DIAGNOSIS — I5022 Chronic systolic (congestive) heart failure: Secondary | ICD-10-CM

## 2017-11-20 HISTORY — DX: Left bundle-branch block, unspecified: I44.7

## 2017-11-20 LAB — I-STAT CHEM 8, ED
BUN: 15 mg/dL (ref 8–23)
CREATININE: 0.9 mg/dL (ref 0.44–1.00)
Calcium, Ion: 1.15 mmol/L (ref 1.15–1.40)
Chloride: 101 mmol/L (ref 98–111)
Glucose, Bld: 152 mg/dL — ABNORMAL HIGH (ref 70–99)
HEMATOCRIT: 39 % (ref 36.0–46.0)
Hemoglobin: 13.3 g/dL (ref 12.0–15.0)
POTASSIUM: 3.9 mmol/L (ref 3.5–5.1)
SODIUM: 132 mmol/L — AB (ref 135–145)
TCO2: 20 mmol/L — AB (ref 22–32)

## 2017-11-20 LAB — I-STAT TROPONIN, ED: Troponin i, poc: 0.04 ng/mL (ref 0.00–0.08)

## 2017-11-20 NOTE — ED Triage Notes (Signed)
Per [facility], pt from home with a c/o of central chest pain for the past couple of hours, intermittent SOB with exertion for the past couple of days, and nausea without vomiting. The chest pain is centrally located and non-radiating. Relieved after 324 of ASA and one nitro. 4 mg of Zofran given. 18 ga IV in left forearm. No LOC.

## 2017-11-21 ENCOUNTER — Observation Stay (HOSPITAL_BASED_OUTPATIENT_CLINIC_OR_DEPARTMENT_OTHER): Payer: Medicare Other

## 2017-11-21 ENCOUNTER — Other Ambulatory Visit: Payer: Self-pay

## 2017-11-21 ENCOUNTER — Encounter (HOSPITAL_COMMUNITY): Payer: Self-pay | Admitting: Emergency Medicine

## 2017-11-21 DIAGNOSIS — E785 Hyperlipidemia, unspecified: Secondary | ICD-10-CM | POA: Diagnosis not present

## 2017-11-21 DIAGNOSIS — E876 Hypokalemia: Secondary | ICD-10-CM | POA: Diagnosis not present

## 2017-11-21 DIAGNOSIS — I248 Other forms of acute ischemic heart disease: Secondary | ICD-10-CM | POA: Diagnosis not present

## 2017-11-21 DIAGNOSIS — I959 Hypotension, unspecified: Secondary | ICD-10-CM | POA: Diagnosis not present

## 2017-11-21 DIAGNOSIS — Z853 Personal history of malignant neoplasm of breast: Secondary | ICD-10-CM | POA: Diagnosis not present

## 2017-11-21 DIAGNOSIS — I10 Essential (primary) hypertension: Secondary | ICD-10-CM | POA: Diagnosis not present

## 2017-11-21 DIAGNOSIS — I509 Heart failure, unspecified: Secondary | ICD-10-CM

## 2017-11-21 DIAGNOSIS — I340 Nonrheumatic mitral (valve) insufficiency: Secondary | ICD-10-CM

## 2017-11-21 DIAGNOSIS — R079 Chest pain, unspecified: Secondary | ICD-10-CM | POA: Diagnosis present

## 2017-11-21 DIAGNOSIS — I429 Cardiomyopathy, unspecified: Secondary | ICD-10-CM | POA: Diagnosis not present

## 2017-11-21 DIAGNOSIS — I110 Hypertensive heart disease with heart failure: Secondary | ICD-10-CM | POA: Diagnosis not present

## 2017-11-21 DIAGNOSIS — I5023 Acute on chronic systolic (congestive) heart failure: Secondary | ICD-10-CM | POA: Diagnosis not present

## 2017-11-21 DIAGNOSIS — I447 Left bundle-branch block, unspecified: Secondary | ICD-10-CM | POA: Diagnosis not present

## 2017-11-21 DIAGNOSIS — Z7982 Long term (current) use of aspirin: Secondary | ICD-10-CM | POA: Diagnosis not present

## 2017-11-21 DIAGNOSIS — I5021 Acute systolic (congestive) heart failure: Secondary | ICD-10-CM

## 2017-11-21 DIAGNOSIS — I5022 Chronic systolic (congestive) heart failure: Secondary | ICD-10-CM

## 2017-11-21 DIAGNOSIS — Z79899 Other long term (current) drug therapy: Secondary | ICD-10-CM | POA: Diagnosis not present

## 2017-11-21 LAB — CBC WITH DIFFERENTIAL/PLATELET
Abs Immature Granulocytes: 0.1 10*3/uL (ref 0.0–0.1)
BASOS ABS: 0 10*3/uL (ref 0.0–0.1)
Basophils Relative: 1 %
EOS ABS: 0.1 10*3/uL (ref 0.0–0.7)
EOS PCT: 1 %
HCT: 40.4 % (ref 36.0–46.0)
Hemoglobin: 13.1 g/dL (ref 12.0–15.0)
IMMATURE GRANULOCYTES: 1 %
Lymphocytes Relative: 29 %
Lymphs Abs: 2.5 10*3/uL (ref 0.7–4.0)
MCH: 30.8 pg (ref 26.0–34.0)
MCHC: 32.4 g/dL (ref 30.0–36.0)
MCV: 95.1 fL (ref 78.0–100.0)
Monocytes Absolute: 0.8 10*3/uL (ref 0.1–1.0)
Monocytes Relative: 10 %
NEUTROS ABS: 5.2 10*3/uL (ref 1.7–7.7)
Neutrophils Relative %: 60 %
Platelets: 268 10*3/uL (ref 150–400)
RBC: 4.25 MIL/uL (ref 3.87–5.11)
RDW: 13.4 % (ref 11.5–15.5)
WBC: 8.7 10*3/uL (ref 4.0–10.5)

## 2017-11-21 LAB — LIPID PANEL
Cholesterol: 205 mg/dL — ABNORMAL HIGH (ref 0–200)
HDL: 53 mg/dL (ref >40)
LDL CALC: 142 mg/dL — AB (ref 0–99)
Total CHOL/HDL Ratio: 3.9 RATIO
Triglycerides: 52 mg/dL (ref <150)
VLDL: 10 mg/dL (ref 0–40)

## 2017-11-21 LAB — ECHOCARDIOGRAM COMPLETE
Height: 61 in
WEIGHTICAEL: 2408 [oz_av]

## 2017-11-21 LAB — HEMOGLOBIN A1C
HEMOGLOBIN A1C: 5.3 % (ref 4.8–5.6)
Mean Plasma Glucose: 105.41 mg/dL

## 2017-11-21 LAB — TROPONIN I
TROPONIN I: 0.15 ng/mL — AB (ref <0.03)
TROPONIN I: 0.1 ng/mL — AB (ref <0.03)
Troponin I: 0.08 ng/mL (ref <0.03)

## 2017-11-21 LAB — BRAIN NATRIURETIC PEPTIDE: B NATRIURETIC PEPTIDE 5: 2052.4 pg/mL — AB (ref 0.0–100.0)

## 2017-11-21 MED ORDER — ENOXAPARIN SODIUM 40 MG/0.4ML ~~LOC~~ SOLN
40.0000 mg | SUBCUTANEOUS | Status: DC
  Administered 2017-11-21 – 2017-11-24 (×3): 40 mg via SUBCUTANEOUS
  Filled 2017-11-21 (×3): qty 0.4

## 2017-11-21 MED ORDER — ACETAMINOPHEN 325 MG PO TABS: 650.0000 mg | ORAL_TABLET | ORAL | Status: DC | PRN

## 2017-11-21 MED ORDER — SODIUM CHLORIDE 0.9 % IV SOLN: 250.0000 mL | INTRAVENOUS | Status: DC | PRN

## 2017-11-21 MED ORDER — ONDANSETRON HCL 4 MG/2ML IJ SOLN
2.0000 mg | Freq: Three times a day (TID) | INTRAMUSCULAR | Status: DC | PRN
  Administered 2017-11-21 (×2): 2 mg via INTRAVENOUS
  Filled 2017-11-21 (×2): qty 2

## 2017-11-21 MED ORDER — FUROSEMIDE 10 MG/ML IJ SOLN
40.0000 mg | Freq: Two times a day (BID) | INTRAMUSCULAR | Status: DC
  Administered 2017-11-22: 40 mg via INTRAVENOUS
  Filled 2017-11-21 (×2): qty 4

## 2017-11-21 MED ORDER — ASPIRIN EC 81 MG PO TBEC
81.0000 mg | DELAYED_RELEASE_TABLET | Freq: Every day | ORAL | Status: DC
  Administered 2017-11-21 – 2017-11-25 (×5): 81 mg via ORAL
  Filled 2017-11-21 (×5): qty 1

## 2017-11-21 MED ORDER — SACUBITRIL-VALSARTAN 24-26 MG PO TABS
1.0000 | ORAL_TABLET | Freq: Two times a day (BID) | ORAL | Status: DC
  Filled 2017-11-21 (×2): qty 1

## 2017-11-21 MED ORDER — ONDANSETRON HCL 4 MG/2ML IJ SOLN
2.0000 mg | Freq: Once | INTRAMUSCULAR | Status: AC
  Administered 2017-11-21: 2 mg via INTRAVENOUS

## 2017-11-21 MED ORDER — SPIRONOLACTONE 25 MG PO TABS
25.0000 mg | ORAL_TABLET | Freq: Every day | ORAL | Status: DC
  Administered 2017-11-22: 25 mg via ORAL
  Filled 2017-11-21: qty 1

## 2017-11-21 MED ORDER — NITROGLYCERIN 0.4 MG SL SUBL: 0.4000 mg | SUBLINGUAL_TABLET | SUBLINGUAL | Status: DC | PRN

## 2017-11-21 MED ORDER — CARVEDILOL 3.125 MG PO TABS
3.1250 mg | ORAL_TABLET | Freq: Two times a day (BID) | ORAL | Status: DC
  Filled 2017-11-21 (×2): qty 1

## 2017-11-21 MED ORDER — ATORVASTATIN CALCIUM 10 MG PO TABS
10.0000 mg | ORAL_TABLET | Freq: Every day | ORAL | Status: DC
  Filled 2017-11-21: qty 1

## 2017-11-21 MED ORDER — SODIUM CHLORIDE 0.9% FLUSH
3.0000 mL | Freq: Two times a day (BID) | INTRAVENOUS | Status: DC
  Administered 2017-11-21 – 2017-11-24 (×8): 3 mL via INTRAVENOUS

## 2017-11-21 MED ORDER — ZOLPIDEM TARTRATE 5 MG PO TABS: 5.0000 mg | ORAL_TABLET | Freq: Every evening | ORAL | Status: DC | PRN

## 2017-11-21 MED ORDER — FUROSEMIDE 10 MG/ML IJ SOLN
40.0000 mg | Freq: Two times a day (BID) | INTRAMUSCULAR | Status: DC
  Administered 2017-11-21: 40 mg via INTRAVENOUS
  Filled 2017-11-21: qty 4

## 2017-11-21 MED ORDER — MORPHINE SULFATE (PF) 2 MG/ML IV SOLN: 0.5000 mg | INTRAVENOUS | Status: DC | PRN

## 2017-11-21 MED ORDER — IRBESARTAN 300 MG PO TABS
300.0000 mg | ORAL_TABLET | Freq: Every day | ORAL | Status: DC
  Administered 2017-11-21: 300 mg via ORAL
  Filled 2017-11-21: qty 1

## 2017-11-21 MED ORDER — ONDANSETRON HCL 4 MG/2ML IJ SOLN
INTRAMUSCULAR | Status: AC
  Filled 2017-11-21: qty 2

## 2017-11-21 MED ORDER — FUROSEMIDE 10 MG/ML IJ SOLN: 20.0000 mg | Freq: Two times a day (BID) | INTRAMUSCULAR | Status: DC

## 2017-11-21 MED ORDER — METOPROLOL TARTRATE 50 MG PO TABS
50.0000 mg | ORAL_TABLET | Freq: Two times a day (BID) | ORAL | Status: DC
  Administered 2017-11-21 (×2): 50 mg via ORAL
  Filled 2017-11-21 (×2): qty 1

## 2017-11-21 MED ORDER — HYDRALAZINE HCL 20 MG/ML IJ SOLN: 5.0000 mg | INTRAMUSCULAR | Status: DC | PRN

## 2017-11-21 MED ORDER — SODIUM CHLORIDE 0.9% FLUSH: 3.0000 mL | INTRAVENOUS | Status: DC | PRN

## 2017-11-21 MED ORDER — FUROSEMIDE 10 MG/ML IJ SOLN
40.0000 mg | Freq: Once | INTRAMUSCULAR | Status: AC
  Administered 2017-11-21: 40 mg via INTRAVENOUS
  Filled 2017-11-21: qty 4

## 2017-11-21 MED ORDER — ENSURE ENLIVE PO LIQD
237.0000 mL | Freq: Two times a day (BID) | ORAL | Status: DC
  Administered 2017-11-22 – 2017-11-25 (×5): 237 mL via ORAL

## 2017-11-21 NOTE — Progress Notes (Signed)
PROGRESS NOTE    Haley Costa  DTO:[id number] DOB: July 06, 1928 DOA: 11/20/2017 PCP: Merrilee Seashore, MD   Brief Narrative:  84 year old woman with a history of hypertension, left bundle branch block, presenting on 11/20/2017 with chest pain and shortness of breath.  ED, she was found to have elevated BNP of 2052, negative troponins, tachycardia, tachypnea, O2 sats normal, afebrile.  Chest x-ray showed interstitial pulmonary edema, with moderate left and small right pleural effusion consistent with CHF.  She was admitted for further work-up.  Cardiology evaluation is pending. Received Lasix 40 mg IV, with significant diuresis, and improvement of her symptoms.  Also, her status is improved.  Assessment & Plan:   Principal Problem:   Acute CHF (congestive heart failure) (HCC) Active Problems:   Chest pain   HTN (hypertension)  Acute CHF, new diagnosis she presented with shortness of breath, elevated BNP of 2000, bilateral lower extremity edema, positive JVD, pulmonary edema on chest x-ray.  The patient received Lasix 40 mg IV twice daily, with significant diuresis and improvement of her symptoms.  Cardiology evaluation is pending.  Change Lasix 20 mg twice daily IV, may switch to oral this evening  Follow 2D echo results Continue daily weights Strict I/O Continue low-salt diet.  Chest pain syndrome, with initial negative troponins, but today at 0.15 in view of demand ischemia due to above.  EKG showing ST depression, and TWI in V5 and V6.  This may be likely due to demand ischemia secondary to CHF, versus ischemic heart disease.  Currently, she is chest pain-free. Continue nitroglycerin, morphine and aspirin as needed Appreciate Cards consultation   Hypertension BP  118/59   Pulse 70    Controlled Continue home anti-hypertensive medications with  irbesartan and metoprolol Hydralazine as needed    Hyperlipidemia LDL 142, with total cholesterol 205. Start Lipitor at a low dose     DVT prophylaxis: Lovenox Code Status: Partial code, okay for CPR but no intubation  Family Communication: None Disposition Plan:if okay with cards, patient may be discharged within the next 24 hours.  Consultants:   Cardiology consultation pending  Procedures:  2D echo pending  Antimicrobials:   None   Subjective: Patient feeling better this morning denies any chest pain or palpitations.  She verbalizes no complaints.  She feels that diureses help with her respiratory symptoms.  She denies any shortness of breath, or cough.  She denies any worsening lower extremity edema.  She is ambulating with assistance.   Objective: Vitals:   11/21/17 0250 11/21/17 0654 11/21/17 0747 11/21/17 0933  BP: (!) 149/85 (!) 117/58  (!) 118/59  Pulse: 98 72 69 70  Resp: (!) 22     Temp: 98.3 F (36.8 C)     TempSrc: Oral     SpO2: 97%  94%   Weight: 68.3 kg     Height: 5\' 1"  (1.549 m)       Intake/Output Summary (Last 24 hours) at 11/21/2017 0949 Last data filed at 11/21/2017 0830 Gross per 24 hour  Intake 0 ml  Output 2750 ml  Net -2750 ml   Filed Weights   11/20/17 2326 11/21/17 0250  Weight: 68 kg 68.3 kg    Examination:  General exam: Appears calm and comfortable  Respiratory system: Trace of crackles at the bases. Respiratory effort normal. Cardiovascular system: S1 & S2 heard, RRR. No JVD, murmurs, rubs, gallops or clicks.  Trace of bilateral lower extremity edema. Gastrointestinal system: Abdomen is nondistended, soft and nontender. No organomegaly  or masses felt. Normal bowel sounds heard. Central nervous system: Alert and oriented. No focal neurological deficits. Extremities: Symmetric 5 x 5 power. Skin: No rashes, lesions or ulcers Psychiatry: Judgement and insight appear normal. Mood & affect appropriate.      Data Reviewed: I have personally reviewed following labs and imaging studies  CBC: Recent Labs  Lab 11/20/17 2319 11/20/17 2328  WBC 8.7  --    NEUTROABS 5.2  --   HGB 13.1 13.3  HCT 40.4 39.0  MCV 95.1  --   PLT 268  --    Basic Metabolic Panel: Recent Labs  Lab 11/20/17 2328  NA 132*  K 3.9  CL 101  GLUCOSE 152*  BUN 15  CREATININE 0.90   GFR: Estimated Creatinine Clearance: 39.7 mL/min (by C-G formula based on SCr of 0.9 mg/dL). Liver Function Tests: No results for input(s): AST, ALT, ALKPHOS, BILITOT, PROT, ALBUMIN in the last 168 hours. No results for input(s): LIPASE, AMYLASE in the last 168 hours. No results for input(s): AMMONIA in the last 168 hours. Coagulation Profile: No results for input(s): INR, PROTIME in the last 168 hours. Cardiac Enzymes: Recent Labs  Lab 11/21/17 0438  TROPONINI 0.15*   BNP (last 3 results) No results for input(s): PROBNP in the last 8760 hours. HbA1C: Recent Labs    11/21/17 0438  HGBA1C 5.3   CBG: No results for input(s): GLUCAP in the last 168 hours. Lipid Profile: Recent Labs    11/21/17 0438  CHOL 205*  HDL 53  LDLCALC 142*  TRIG 52  CHOLHDL 3.9   Thyroid Function Tests: No results for input(s): TSH, T4TOTAL, FREET4, T3FREE, THYROIDAB in the last 72 hours. Anemia Panel: No results for input(s): VITAMINB12, FOLATE, FERRITIN, TIBC, IRON, RETICCTPCT in the last 72 hours. Sepsis Labs: No results for input(s): PROCALCITON, LATICACIDVEN in the last 168 hours.  No results found for this or any previous visit (from the past 240 hour(s)).       Radiology Studies: Dg Chest 2 View  Result Date: 11/20/2017 CLINICAL DATA:  84 y/o  F; pain. EXAM: CHEST - 2 VIEW COMPARISON:  10/09/2017 thoracic spine radiographs FINDINGS: Moderate left and small right pleural effusions. Diffuse reticular and patchy basilar opacities. Normal cardiac silhouette given projection and technique. Calcific aortic atherosclerosis. Surgical clips project over the left axilla. Compression deformities of the thoracolumbar junction post kyphoplasty. No acute osseous abnormality is evident.  IMPRESSION: Interstitial pulmonary edema with moderate left and small right pleural effusions. Basilar opacities probably represent associated atelectasis. Underlying pneumonia is possible. Electronically Signed   By: Kristine Garbe M.D.   On: 11/20/2017 23:54        Scheduled Meds: . aspirin EC  81 mg Oral Daily  . enoxaparin (LOVENOX) injection  40 mg Subcutaneous Q24H  . furosemide  40 mg Intravenous Q12H  . irbesartan  300 mg Oral Daily  . metoprolol tartrate  50 mg Oral BID  . sodium chloride flush  3 mL Intravenous Q12H   Continuous Infusions: . sodium chloride       LOS: 0 days       Sharene Butters, MD [facility] Pager [phone]  If 7PM-7AM, please contact night-coverage [email or web address] Password [email or web address] 11/21/2017, 9:49 AM

## 2017-11-21 NOTE — ED Notes (Signed)
Attempted report x1. 

## 2017-11-21 NOTE — Progress Notes (Signed)
  Echocardiogram 2D Echocardiogram has been performed.  Dellie Piasecki T Shruthi Northrup 11/21/2017, 9:34 AM

## 2017-11-21 NOTE — H&P (Signed)
History and Physical    Haley Costa:[id number] DOB: [birth year] DOA: 11/20/2017  Referring MD/NP/PA:   PCP: Merrilee Seashore, MD   Patient coming from:  The patient is coming from home.  At baseline, pt is independent for most of ADL.   Chief Complaint: chest pain and SOB  HPI: Haley Costa is a 84 y.o. female with medical history significant of hypertension, left bundle blockage, breast cancer (s/p of left mastectomy 1992, s/p of chemotherapy, no radiation therapy), who presents with chest pain shortness of breath.  Patient states that she has been having chest pain or shortness of breath for about 3 days, which has been progressively getting worse.  The chest pain is located in the lower chest, 5 out of 10 in severity, pressure-like, nonradiating.  It is associated with the shortness of breath, which is exertional.  Patient has dry cough, but no fever or chills.  No recent long distant traveling.  Patient has nausea, no vomiting, diarrhea or abdominal pain.  Denies symptoms of UTI.  No unilateral weakness. Patient states that she was told by her PCP that she has left bundle blockage on EKG.  ED Course: pt was found to have BNP 2052, negative troponin, electrolytes renal function okay, temperature normal, tachycardia, tachypnea, oxygen saturation 93% to 96% on room air.  Chest x-ray showed interstitial pulmonary edema, moderate of left and small right pleural effusion.  Patient is placed on telemetry bed for observation.   Review of Systems:   General: no fevers, chills, has poor appetite, has fatigue HEENT: no blurry vision, hearing changes or sore throat Respiratory: has dyspnea, coughing, no wheezing CV: has chest pain, no palpitations GI: has nausea, no vomiting, abdominal pain, diarrhea, constipation GU: no dysuria, burning on urination, increased urinary frequency, hematuria  Ext: has leg edema Neuro: no unilateral weakness, numbness, or tingling, no vision change  or hearing loss Skin: no rash, no skin tear. MSK: No muscle spasm, no deformity, no limitation of range of movement in spin Heme: No easy bruising.  Travel history: No recent long distant travel.  Allergy:  Allergies  Allergen Reactions  . Chlorthalidone   . Lisinopril   . Persantine [Dipyridamole]     Past Medical History:  Diagnosis Date  . Cancer (HCC)    Breast  . Hypertension   . LBBB (left bundle branch block)    Patient states that she was told by her PCP that she has left bundle blockage    Past Surgical History:  Procedure Laterality Date  . APPENDECTOMY    . MASTECTOMY      Social History:  reports that she has never smoked. She has never used smokeless tobacco. She reports that she drank alcohol. She reports that she does not use drugs.  Family History:  Family History  Problem Relation Age of Onset  . Cervical cancer Mother   . Hypertension Brother      Prior to Admission medications   Medication Sig Start Date End Date Taking? Authorizing Provider  furosemide (LASIX) 20 MG tablet Take 20 mg by mouth daily.   Yes [provider]  irbesartan (AVAPRO) 300 MG tablet Take 300 mg by mouth daily.   Yes [provider]  metoprolol (LOPRESSOR) 50 MG tablet Take 50 mg by mouth 2 (two) times daily.   Yes [provider]    Physical Exam: Vitals:   11/21/17 0200 11/21/17 0215 11/21/17 0230 11/21/17 0250  BP: 131/72 130/70 127/63 (!) 149/85  Pulse:  88 83 98  Resp:  18 (!) 23 (!) 22  Temp:    98.3 F (36.8 C)  TempSrc:    Oral  SpO2:  92% 93% 97%  Weight:    68.3 kg  Height:    5\' 1"  (1.549 m)   General: Not in acute distress HEENT:       Eyes: PERRL, EOMI, no scleral icterus.       ENT: No discharge from the ears and nose, no pharynx injection, no tonsillar enlargement.        Neck: positive JVD, no bruit, no mass felt. Heme: No neck lymph node enlargement. Cardiac: S1/S2, RRR, No murmurs, No gallops or rubs. Respiratory:   No rales, wheezing, rhonchi or rubs. GI: Soft, nondistended, nontender, no rebound pain, no organomegaly, BS present. GU: No hematuria Ext: 1+ pitting leg edema bilaterally. 2+DP/PT pulse bilaterally. Musculoskeletal: No joint deformities, No joint redness or warmth, no limitation of ROM in spin. Skin: No rashes.  Neuro: Alert, oriented X3, cranial nerves II-XII grossly intact, moves all extremities normally. Psych: Patient is not psychotic, no suicidal or hemocidal ideation.  Labs on Admission: I have personally reviewed following labs and imaging studies  CBC: Recent Labs  Lab 11/20/17 2319 11/20/17 2328  WBC 8.7  --   NEUTROABS 5.2  --   HGB 13.1 13.3  HCT 40.4 39.0  MCV 95.1  --   PLT 268  --    Basic Metabolic Panel: Recent Labs  Lab 11/20/17 2328  NA 132*  K 3.9  CL 101  GLUCOSE 152*  BUN 15  CREATININE 0.90   GFR: Estimated Creatinine Clearance: 39.7 mL/min (by C-G formula based on SCr of 0.9 mg/dL). Liver Function Tests: No results for input(s): AST, ALT, ALKPHOS, BILITOT, PROT, ALBUMIN in the last 168 hours. No results for input(s): LIPASE, AMYLASE in the last 168 hours. No results for input(s): AMMONIA in the last 168 hours. Coagulation Profile: No results for input(s): INR, PROTIME in the last 168 hours. Cardiac Enzymes: No results for input(s): CKTOTAL, CKMB, CKMBINDEX, TROPONINI in the last 168 hours. BNP (last 3 results) No results for input(s): PROBNP in the last 8760 hours. HbA1C: No results for input(s): HGBA1C in the last 72 hours. CBG: No results for input(s): GLUCAP in the last 168 hours. Lipid Profile: No results for input(s): CHOL, HDL, LDLCALC, TRIG, CHOLHDL, LDLDIRECT in the last 72 hours. Thyroid Function Tests: No results for input(s): TSH, T4TOTAL, FREET4, T3FREE, THYROIDAB in the last 72 hours. Anemia Panel: No results for input(s): VITAMINB12, FOLATE, FERRITIN, TIBC, IRON, RETICCTPCT in the last 72 hours. Urine analysis: No results  found for: COLORURINE, APPEARANCEUR, LABSPEC, PHURINE, GLUCOSEU, HGBUR, BILIRUBINUR, KETONESUR, PROTEINUR, UROBILINOGEN, NITRITE, LEUKOCYTESUR Sepsis Labs: @LABRCNTIP (procalcitonin:4,lacticidven:4) )No results found for this or any previous visit (from the past 240 hour(s)).   Radiological Exams on Admission: Dg Chest 2 View  Result Date: 11/20/2017 CLINICAL DATA:  84 y/o  F; pain. EXAM: CHEST - 2 VIEW COMPARISON:  10/09/2017 thoracic spine radiographs FINDINGS: Moderate left and small right pleural effusions. Diffuse reticular and patchy basilar opacities. Normal cardiac silhouette given projection and technique. Calcific aortic atherosclerosis. Surgical clips project over the left axilla. Compression deformities of the thoracolumbar junction post kyphoplasty. No acute osseous abnormality is evident. IMPRESSION: Interstitial pulmonary edema with moderate left and small right pleural effusions. Basilar opacities probably represent associated atelectasis. Underlying pneumonia is possible. Electronically Signed   By: Kristine Garbe M.D.   On: 11/20/2017 23:54  EKG: Independently reviewed.  Sinus rhythm, QTC 484, left bundle blockage which is old, PVC, anteroseptal infarction pattern, ST depression and T wave inversion in V5-V6   Assessment/Plan Principal Problem:   Acute CHF (congestive heart failure) (HCC) Active Problems:   Chest pain   HTN (hypertension)   Acute CHF (congestive heart failure) [facility]): Patient has shortness of breath, elevated BNP, bilateral leg edema, positive JVD and pulmonary edema chest x-ray, consistent with new CHF.  Not clear which type of CHF, but given long history of hypertension, possibly due to diastolic CHF.  -will place on tele bed for obs -Lasix 40 mg bid by IV -2d echo -Daily weights -strict I/O's -Low salt diet  Chest pain: trop negative. EKG showed ST depression in the T wave inversion in V5-V6.  May be due to demand ischemia secondary to  CHF, alternatively patient may have ischemic heart disease, which caused CHF. - cycle CE q6 x3 and repeat EKG in the am  - prn Nitroglycerin, Morphine, and aspirin - Risk factor stratification: will check FLP and A1C  - inpt card consult was requested via Epic  HTN:  -Continue home medications: Irbesartan and metoprolol -IV hydralazine prn   DVT ppx: SQ Lovenox Code Status: Partial code (I discussed with the patient in the presence of her son,  and explained the meaning of CODE STATUS, patient wants to be partial code, OK for CPR, but no intubation). Family Communication:  Yes, patient's  son  at bed side Disposition Plan:  Anticipate discharge back to previous home environment Consults called:  none Admission status: Obs / tele  Date of Service 11/21/2017    Ivor Costa [facility] Pager [phone]  If 7PM-7AM, please contact night-coverage [email or web address] Password [email or web address] 11/21/2017, 5:26 AM

## 2017-11-21 NOTE — Consult Note (Addendum)
Cardiology Consultation:   Patient ID: Haley Costa MRN: [id number]; DOB: 1928-11-18  Admit date: 11/20/2017 Date of Consult: 11/21/2017  Primary Care Provider: Merrilee Seashore, MD Primary Cardiologist: New to [place] Primary Electrophysiologist:  None    Patient Profile:   Haley Costa is a 84 y.o. female with a hx of HTN (on ARB, ACEI allergy), HLD (new dx), LBBB (existing), breast CA who is being seen today for the evaluation of chest pain with volume overload (BNP 2052) at the request of Haley Butters, PA-C.  History of Present Illness:   [name] is an 84 yo female that presented to the ED with non-radiating central lower chest pressure / (epigastric pain) rated 5/10 and associated with exertional and progressive worsening SOB and dry cough x3 days (9/3-9/5). She also reported recent reduced appetite, fatigue, nausea, LEE. No recent long distance travel reported.  11/20/17: In the ED Vitals significant for 131/72, HR 88, RR 18, T 98.14F, SpO2 92%-96% ORA. Initially negative troponin   0.15  0.10 BNP 2052 CXR showed interstitial pulmonary edema, b/l mild pleural effusion  EKG:  NSR, QTc 484, 79 bpm, LBBB, possible LAE, LAD, and LVH, TW abnormalities. Previous 9/5 EKG shows LBBB, PVCs, LVH.  In the ED, physical exam showed positive JVD and LEE and she received IV lasix 40mg  with significant improvement in symptoms.   **ACEI allergy noted  Past Medical History:  Diagnosis Date  . Cancer (HCC)    Breast  . Hypertension   . LBBB (left bundle branch block)    Patient states that she was told by her PCP that she has left bundle blockage    Past Surgical History:  Procedure Laterality Date  . APPENDECTOMY    . MASTECTOMY       Home Medications:  Prior to Admission medications   Medication Sig Start Date End Date Taking? Authorizing Provider  furosemide (LASIX) 20 MG tablet Take 20 mg by mouth daily.   Yes [provider]  irbesartan (AVAPRO)  300 MG tablet Take 300 mg by mouth daily.   Yes [provider]  metoprolol (LOPRESSOR) 50 MG tablet Take 50 mg by mouth 2 (two) times daily.   Yes [provider]    Inpatient Medications: Scheduled Meds: . aspirin EC  81 mg Oral Daily  . atorvastatin  10 mg Oral q1800  . enoxaparin (LOVENOX) injection  40 mg Subcutaneous Q24H  . furosemide  20 mg Intravenous Q12H  . irbesartan  300 mg Oral Daily  . metoprolol tartrate  50 mg Oral BID  . sodium chloride flush  3 mL Intravenous Q12H   Continuous Infusions: . sodium chloride     PRN Meds: sodium chloride, acetaminophen, hydrALAZINE, morphine injection, nitroGLYCERIN, ondansetron (ZOFRAN) IV, sodium chloride flush, zolpidem  Allergies:    Allergies  Allergen Reactions  . Chlorthalidone   . Lisinopril   . Persantine [Dipyridamole]     Social History:   Social History   Socioeconomic History  . Marital status: Widowed    Spouse name: Not on file  . Number of children: Not on file  . Years of education: Not on file  . Highest education level: Not on file  Occupational History  . Not on file  Social Needs  . Financial resource strain: Not on file  . Food insecurity:    Worry: Not on file    Inability: Not on file  . Transportation needs:    Medical: Not on file  Non-medical: Not on file  Tobacco Use  . Smoking status: Never Smoker  . Smokeless tobacco: Never Used  Substance and Sexual Activity  . Alcohol use: Not Currently  . Drug use: Never  . Sexual activity: Not on file  Lifestyle  . Physical activity:    Days per week: Not on file    Minutes per session: Not on file  . Stress: Not on file  Relationships  . Social connections:    Talks on phone: Not on file    Gets together: Not on file    Attends religious service: Not on file    Active member of club or organization: Not on file    Attends meetings of clubs or organizations: Not on file    Relationship status: Not on file  .  Intimate partner violence:    Fear of current or ex partner: Not on file    Emotionally abused: Not on file    Physically abused: Not on file    Forced sexual activity: Not on file  Other Topics Concern  . Not on file  Social History Narrative  . Not on file    Family History:    Family History  Problem Relation Age of Onset  . Cervical cancer Mother   . Hypertension Brother      ROS:  Please see the history of present illness.  All other ROS reviewed and negative.     Physical Exam/Data:   Vitals:   11/21/17 0250 11/21/17 0654 11/21/17 0747 11/21/17 0933  BP: (!) 149/85 (!) 117/58  (!) 118/59  Pulse: 98 72 69 70  Resp: (!) 22     Temp: 98.3 F (36.8 C)     TempSrc: Oral     SpO2: 97%  94%   Weight: 68.3 kg     Height: 5\' 1"  (1.549 m)       Intake/Output Summary (Last 24 hours) at 11/21/2017 1257 Last data filed at 11/21/2017 0830 Gross per 24 hour  Intake 0 ml  Output 2750 ml  Net -2750 ml   Filed Weights   11/20/17 2326 11/21/17 0250  Weight: 68 kg 68.3 kg   Body mass index is 28.44 kg/m.  General:  Well nourished, well developed, in no acute distress- chest pain relieved HEENT: normal Lymph: no adenopathy Neck: + JVD Endocrine:  No thryomegaly Vascular: No carotid bruits; FA pulses 2+ bilaterally without bruits  Cardiac:  normal S1, S2; RRR; ?moderate MR  Lungs:  Reduced LL sounds b/l clear to auscultation bilaterally, no wheezing, rhonchi or rales  Abd: soft, nontender, no hepatomegaly  Ext: + 1-2 edema Musculoskeletal:  No deformities, BUE and BLE strength normal and equal Skin: warm and dry  Neuro:  CNs 2-12 intact, no focal abnormalities noted Psych:  Normal affect    EKG:  The 9/6 EKG was personally reviewed and demonstrates:  NSR, QTc 484, 79 bpm, LBBB, possible LAE, LAD, and LVH. Previous 9/5 EKG shows LBBB, PVCs, LVH.   Relevant CV Studies: 02/2004: MYOCARDIAL PERFUSION IMAGING WITH SPECT AT REST AND STRESS, GATED LEFT VENTRICULAR WALL  MOTION STUDY, AND EJECTION FRACTION:   The myocardial SPECT images show inferior wall thinning on both the images obtained during stress and rest. There is no corresponding inferior wall motion abnormality and this is felt to be consistent with diaphragmatic attenuation. No reversible myocardial perfusion defects are seen to suggest the presence of myocardial ischemia.   The gated left ventricular wall motion study shows no evidence  of regional left ventricular wall motion abnormality.   The calculated left ventricular ejection fraction is 66%.  IMPRESSION:  1. No evidence of pharmacologic induced myocardial ischemia. Diaphragmatic attenuation artifact noted.   2. Normal left ventricular wall motion study.   3. Calculated left ventricular ejection fraction of 66%.  Laboratory Data:  Chemistry Recent Labs  Lab 11/20/17 2328  NA 132*  K 3.9  CL 101  GLUCOSE 152*  BUN 15  CREATININE 0.90    No results for input(s): PROT, ALBUMIN, AST, ALT, ALKPHOS, BILITOT in the last 168 hours. Hematology Recent Labs  Lab 11/20/17 2319 11/20/17 2328  WBC 8.7  --   RBC 4.25  --   HGB 13.1 13.3  HCT 40.4 39.0  MCV 95.1  --   MCH 30.8  --   MCHC 32.4  --   RDW 13.4  --   PLT 268  --    Cardiac Enzymes Recent Labs  Lab 11/21/17 0438 11/21/17 1041  TROPONINI 0.15* 0.10*    Recent Labs  Lab 11/20/17 2327  TROPIPOC 0.04    BNP Recent Labs  Lab 11/21/17 0025  BNP 2,052.4*    DDimer No results for input(s): DDIMER in the last 168 hours.  Radiology/Studies:  Dg Chest 2 View  Result Date: 11/20/2017 CLINICAL DATA:  84 y/o  F; pain. EXAM: CHEST - 2 VIEW COMPARISON:  10/09/2017 thoracic spine radiographs FINDINGS: Moderate left and small right pleural effusions. Diffuse reticular and patchy basilar opacities. Normal cardiac silhouette given projection and technique. Calcific aortic atherosclerosis. Surgical clips project over the left axilla. Compression deformities of the thoracolumbar  junction post kyphoplasty. No acute osseous abnormality is evident. IMPRESSION: Interstitial pulmonary edema with moderate left and small right pleural effusions. Basilar opacities probably represent associated atelectasis. Underlying pneumonia is possible. Electronically Signed   By: Kristine Garbe M.D.   On: 11/20/2017 23:54    Assessment and Plan:   Chest pain and mild troponin elevation, volume overload, likely acute systolic congestive heart failure - Troponin minimally / flat trending with peak 0.15  0.10. Continue to cycle with third troponin. - EKG as above shows existing LBBB, LAD, LVH, possible LAE. Continue AM EKGs - BNP 2052.4.  - CXR as above with pulmonary edema and moderate b/l pleural effusions, possible pna.  - Echo - Pending result of echo today - Meds: Continue home lopressor 50mg  BID, ARB, lasix 20mg . Continue ASA 81mg , nitro, morphine. On lovenox injection. Continue lasix - Volume overload: Continue diuresis. Strict I/O and daily weights. If SOB continues, consider oxygen - Labs: Check A1C, TSH - BMP daily to monitor electrolytes and renal function. Renal function stable with Cr 0.90. K 3.9. Na 132 - Ddx - includes acute diastolic congestive heart failure. H/o LVH, previous 2005 myo shows preserved EF, h/o HTN, volume overloaded. Consider CAD - lipid panel shows HLD and not previously on statin. Consider discharge of patient (pending echo) with home po lasix, ARB, toprol xl or carvediolol - Further recommendations pending MD to see patient   HTN - Continue  blocker, hydralazine, ARB  - Systolic 970-263 / diastolic 78-58 - Recommend better BP control with above medical therapy - Noted allergy to ACEI - Monitor  HLD - Goal LDL <70 - 11/2017 LDL 142, total cholesterol 205 - No home statin therapy. Started on Lipitor 10mg  this admission - Recommend discharge with high intensity statin with follow-up lipid and liver panels / monitoring    For questions or  updates, please contact  [facility] Please consult [email or web address] for contact info under     Signed, Arvil Chaco, PA-C  11/21/2017 12:57 PM   As above, patient seen and examined.  Briefly she is an 84 year old female with past medical history of hypertension, hyperlipidemia, left bundle branch block, prior breast cancer for evaluation of acute systolic congestive heart failure.  Patient states for the past 2 weeks she has had progressive dyspnea on exertion.  She also has had epigastric/mid abdominal tightness.  She denies chest pain.  She has had mild pedal edema.  Her dyspnea worsened and she presented for further evaluation and cardiology asked to evaluate.  Laboratory significant for BNP 2052.  Creatinine 0.90.  Troponin 0 0.15 and 0.10.  Electrocardiogram shows sinus rhythm with couplet and left bundle branch block.  Echocardiogram final results pending but I personally reviewed and appears to have severely reduced LV function with ejection fraction 20 to 25%, moderate mitral regurgitation.  1 acute systolic congestive heart failure-patient presented with CHF symptoms.  We will increase Lasix to 40 mg IV twice daily.  Add Spironolactone 25 mg daily.  Follow potassium and renal function.  Fluid restrict to 1 L daily.  Low-sodium diet.  2 cardiomyopathy-etiology unclear.  Check TSH.  No history of alcohol use.  We discussed cardiac catheterization to exclude coronary disease.  The risks and benefits including myocardial infarction, CVA and death discussed.  She would like to consider this before proceeding.  We will discontinue ARB and instead treat with Entresto 24/26 twice daily.  Discontinue metoprolol and add carvedilol 3.125 mg twice daily.  I will increase later as CHF improves.  Add spironolactone as outlined above.  3 hypertension-follow blood pressure on above medications and adjust as needed.   Kirk Ruths, MD

## 2017-11-21 NOTE — Progress Notes (Signed)
Initial Nutrition Assessment  DOCUMENTATION CODES:   Not applicable  INTERVENTION:   - Changed diet from Renal to 2 gram sodium given pt's diagnosis of CHF  - Ensure Enlive po BID, each supplement provides 350 kcal and 20 grams of protein (vanilla)  - Provided brief education on low-sodium diet  NUTRITION DIAGNOSIS:   Inadequate oral intake related to poor appetite, other (see comment)(taste changes) as evidenced by per patient/family report.  GOAL:   Patient will meet greater than or equal to 90% of their needs  MONITOR:   PO intake, Supplement acceptance, Labs, I & O's, Weight trends  REASON FOR ASSESSMENT:   Malnutrition Screening Tool    ASSESSMENT:   84 year old female who presented to the ED with chest pain and SOB. PMH significant for hypertension, L bundle blockage, and breast cancer (s/p L mastectomy in 1992, s/p chemotherapy). Pt found to have new CHF.  Discussed pt with RN.  Spoke with pt and son at bedside. Pt reports that her appetite has been poor for 2 weeks PTA due to "pressure on my stomach." Pt reports that this made it hard for her to breathe and caused nausea.  Pt states that she typically eats 2 meals daily.  Breakfast: bowl of cereal or Egg McMuffin from [employer] Early Dinner: [employer] frozen meal ("comes in a bag")  Pt states that she mostly drinks water but will have an Ensure supplement "every once in a while." Pt's son requested Ensure Enlive oral nutrition supplement during admission. RD provided one to pt and ordered BID.  Pt is unsure if she has lost weight recently. Pt reports her UBW as 150-155 lbs. Weight history in chart is limited as last weight recorded PTA was in 2014. Noted pt's current weight of 150.5 lbs falls into her UBW range.  Pt expresses concern with taste changes over the past several months and that sweets don't taste sweet anymore. Pt states that when she feels well, she typically "eats normally."  Briefly discussed  low-sodium diet with pt. Using pt's dietary recall as an example, provided examples on ways to decrease sodium intake in diet. Discouraged intake of processed foods and use of salt shaker. Encouraged fresh fruits and vegetables as well as whole grain sources of carbohydrates to maximize fiber intake.   Medications reviewed and include: 40 mg Lasix BID  Labs reviewed: cholesterol 205 (H), LDL 142 (H), sodium 132 (L)  UOP: 1550 ml x 12 hours I/O's: -2.8 L since admit  NUTRITION - FOCUSED PHYSICAL EXAM:    Most Recent Value  Orbital Region  Mild depletion  Upper Arm Region  Mild depletion  Thoracic and Lumbar Region  No depletion  Buccal Region  Mild depletion  Temple Region  Mild depletion  Clavicle Bone Region  Mild depletion  Clavicle and Acromion Bone Region  Mild depletion  Scapular Bone Region  Unable to assess  Dorsal Hand  No depletion  Patellar Region  No depletion  Anterior Thigh Region  Mild depletion  Posterior Calf Region  Mild depletion  Edema (RD Assessment)  Mild [BLE]  Hair  Reviewed  Eyes  Reviewed  Mouth  Reviewed  Skin  Reviewed  Nails  Reviewed    Suspect mild depletions related to older age rather than protein-calorie malnutrition.   Diet Order:   Diet Order            Diet 2 gram sodium Room service appropriate? Yes; Fluid consistency: Thin; Fluid restriction: 1500 mL Fluid  Diet effective now  EDUCATION NEEDS:   Education needs have been addressed  Skin:  Skin Assessment: Reviewed RN Assessment  Last BM:  PTA  Height:   Ht Readings from Last 1 Encounters:  11/21/17 5\' 1"  (1.549 m)    Weight:   Wt Readings from Last 1 Encounters:  11/21/17 68.3 kg    Ideal Body Weight:  47.73 kg  BMI:  Body mass index is 28.44 kg/m.  Estimated Nutritional Needs:   Kcal:  1300-1500  Protein:  70-85 grams  Fluid:  >/= 1.5 L    Gaynell Face, MS, RD, LDN Pager: [phone] Weekend/After Hours: [phone]

## 2017-11-21 NOTE — ED Notes (Signed)
Attempted report x 2 

## 2017-11-21 NOTE — ED Provider Notes (Signed)
[facility] EMERGENCY DEPARTMENT Provider Note   CSN: [id number] Arrival date & time: 11/20/17  2305     History   Chief Complaint Chief Complaint  Patient presents with  . Chest Pain    HPI Haley Costa is a 84 y.o. female.  The history is provided by the patient.  Chest Pain   This is a new problem. The current episode started 3 to 5 hours ago. The problem occurs constantly. The problem has not changed since onset.The pain is associated with rest. The pain is present in the substernal region. The pain is moderate. The quality of the pain is described as dull. The pain does not radiate. Duration of episode(s) is 5 hours. Pertinent negatives include no abdominal pain, no cough, no diaphoresis, no dizziness, no fever, no palpitations and no vomiting. She has tried nothing for the symptoms. The treatment provided no relief. Risk factors include being elderly.  Pertinent negatives for past medical history include no Kawasaki disease.  Pertinent negatives for family medical history include: no Marfan's syndrome.  Procedure history is negative for EPS study.    Past Medical History:  Diagnosis Date  . Cancer ([place])    Breast  . Hypertension     There are no active problems to display for this patient.   Past Surgical History:  Procedure Laterality Date  . APPENDECTOMY    . MASTECTOMY       OB History   None      Home Medications    Prior to Admission medications   Medication Sig Start Date End Date Taking? Authorizing Provider  amLODipine (NORVASC) 5 MG tablet Take 5 mg by mouth daily.    [provider]  cyclobenzaprine (FLEXERIL) 10 MG tablet Take 10 mg by mouth 3 (three) times daily as needed for muscle spasms.    [provider]  furosemide (LASIX) 20 MG tablet Take 20 mg by mouth daily.    [provider]  irbesartan (AVAPRO) 300 MG tablet Take 300 mg by mouth daily.    [provider]  metoprolol  (LOPRESSOR) 50 MG tablet Take 50 mg by mouth 2 (two) times daily.    [provider]    Family History No family history on file.  Social History Social History   Tobacco Use  . Smoking status: Never Smoker  . Smokeless tobacco: Never Used  Substance Use Topics  . Alcohol use: Not on file  . Drug use: Not on file     Allergies   Chlorthalidone; Lisinopril; and Persantine [dipyridamole]   Review of Systems Review of Systems  Constitutional: Negative for diaphoresis and fever.  Respiratory: Negative for cough.   Cardiovascular: Positive for chest pain and leg swelling. Negative for palpitations.  Gastrointestinal: Negative for abdominal pain and vomiting.  Neurological: Negative for dizziness.  All other systems reviewed and are negative.    Physical Exam Updated Vital Signs BP (!) 167/92 (BP Location: Right Arm)   Pulse (!) 108   Temp 98.1 F (36.7 C) (Oral)   Resp (!) 21   Ht 5\' 1"  (1.549 m)   Wt 68 kg   SpO2 95%   BMI 28.34 kg/m   Physical Exam  Constitutional: She appears well-developed and well-nourished.  HENT:  Head: Normocephalic and atraumatic.  Nose: Nose normal.  Mouth/Throat: No oropharyngeal exudate.  Eyes: Pupils are equal, round, and reactive to light. Conjunctivae are normal.  Neck: Normal range of motion. Neck supple.  Cardiovascular: Normal  rate, regular rhythm, normal heart sounds and intact distal pulses.  Pulmonary/Chest: She has wheezes. She has rales.  Abdominal: Soft. Bowel sounds are normal. There is no tenderness.  Musculoskeletal: She exhibits edema.  Neurological: She is alert.  Skin: Skin is warm and dry. Capillary refill takes less than 2 seconds. She is not diaphoretic.  Psychiatric: She has a normal mood and affect.     ED Treatments / Results  Labs (all labs ordered are listed, but only abnormal results are displayed) Results for orders placed or performed during the hospital encounter of 11/20/17  CBC with  Differential/Platelet  Result Value Ref Range   WBC 8.7 4.0 - 10.5 K/uL   RBC 4.25 3.87 - 5.11 MIL/uL   Hemoglobin 13.1 12.0 - 15.0 g/dL   HCT 40.4 36.0 - 46.0 %   MCV 95.1 78.0 - 100.0 fL   MCH 30.8 26.0 - 34.0 pg   MCHC 32.4 30.0 - 36.0 g/dL   RDW 13.4 11.5 - 15.5 %   Platelets 268 150 - 400 K/uL   Neutrophils Relative % 60 %   Neutro Abs 5.2 1.7 - 7.7 K/uL   Lymphocytes Relative 29 %   Lymphs Abs 2.5 0.7 - 4.0 K/uL   Monocytes Relative 10 %   Monocytes Absolute 0.8 0.1 - 1.0 K/uL   Eosinophils Relative 1 %   Eosinophils Absolute 0.1 0.0 - 0.7 K/uL   Basophils Relative 1 %   Basophils Absolute 0.0 0.0 - 0.1 K/uL   Immature Granulocytes 1 %   Abs Immature Granulocytes 0.1 0.0 - 0.1 K/uL  I-Stat Chem 8, ED  Result Value Ref Range   Sodium 132 (L) 135 - 145 mmol/L   Potassium 3.9 3.5 - 5.1 mmol/L   Chloride 101 98 - 111 mmol/L   BUN 15 8 - 23 mg/dL   Creatinine, Ser 0.90 0.44 - 1.00 mg/dL   Glucose, Bld 152 (H) 70 - 99 mg/dL   Calcium, Ion 1.15 1.15 - 1.40 mmol/L   TCO2 20 (L) 22 - 32 mmol/L   Hemoglobin 13.3 12.0 - 15.0 g/dL   HCT 39.0 36.0 - 46.0 %  I-stat troponin, ED  Result Value Ref Range   Troponin i, poc 0.04 0.00 - 0.08 ng/mL   Comment 3           Dg Chest 2 View  Result Date: 11/20/2017 CLINICAL DATA:  84 y/o  F; pain. EXAM: CHEST - 2 VIEW COMPARISON:  10/09/2017 thoracic spine radiographs FINDINGS: Moderate left and small right pleural effusions. Diffuse reticular and patchy basilar opacities. Normal cardiac silhouette given projection and technique. Calcific aortic atherosclerosis. Surgical clips project over the left axilla. Compression deformities of the thoracolumbar junction post kyphoplasty. No acute osseous abnormality is evident. IMPRESSION: Interstitial pulmonary edema with moderate left and small right pleural effusions. Basilar opacities probably represent associated atelectasis. Underlying pneumonia is possible. Electronically Signed   By: Kristine Garbe M.D.   On: 11/20/2017 23:54    EKG EKG Interpretation  Date/Time:  Thursday November 20 2017 23:14:23 EDT Ventricular Rate:  114 PR Interval:    QRS Duration: 125 QT Interval:  351 QTC Calculation: 484 R Axis:   -40 Text Interpretation:  Sinus tachycardia Ventricular premature complex Left bundle branch block Confirmed by Randal Buba, Conn Trombetta ([id number]) on 11/20/2017 11:18:25 PM   Radiology Dg Chest 2 View  Result Date: 11/20/2017 CLINICAL DATA:  84 y/o  F; pain. EXAM: CHEST - 2 VIEW COMPARISON:  10/09/2017 thoracic spine  radiographs FINDINGS: Moderate left and small right pleural effusions. Diffuse reticular and patchy basilar opacities. Normal cardiac silhouette given projection and technique. Calcific aortic atherosclerosis. Surgical clips project over the left axilla. Compression deformities of the thoracolumbar junction post kyphoplasty. No acute osseous abnormality is evident. IMPRESSION: Interstitial pulmonary edema with moderate left and small right pleural effusions. Basilar opacities probably represent associated atelectasis. Underlying pneumonia is possible. Electronically Signed   By: Kristine Garbe M.D.   On: 11/20/2017 23:54    Procedures Procedures (including critical care time)  Medications Ordered in ED Medications  furosemide (LASIX) injection 40 mg (has no administration in time range)      Final Clinical Impressions(s) / ED Diagnoses   Final diagnoses:  Chest pain, unspecified type  Acute pulmonary edema ([place])    Admit for acute pulmonary edema   Melonee Gerstel, MD 11/21/17 2035

## 2017-11-21 NOTE — Progress Notes (Signed)
Pt experiencing nausea prn given with low b/p 90s/40 MD called to hold till morning and recheck

## 2017-11-22 DIAGNOSIS — Z888 Allergy status to other drugs, medicaments and biological substances status: Secondary | ICD-10-CM | POA: Diagnosis not present

## 2017-11-22 DIAGNOSIS — I959 Hypotension, unspecified: Secondary | ICD-10-CM | POA: Diagnosis not present

## 2017-11-22 DIAGNOSIS — Z853 Personal history of malignant neoplasm of breast: Secondary | ICD-10-CM | POA: Diagnosis not present

## 2017-11-22 DIAGNOSIS — I340 Nonrheumatic mitral (valve) insufficiency: Secondary | ICD-10-CM | POA: Diagnosis present

## 2017-11-22 DIAGNOSIS — I5021 Acute systolic (congestive) heart failure: Secondary | ICD-10-CM | POA: Diagnosis not present

## 2017-11-22 DIAGNOSIS — R55 Syncope and collapse: Secondary | ICD-10-CM | POA: Diagnosis not present

## 2017-11-22 DIAGNOSIS — R079 Chest pain, unspecified: Secondary | ICD-10-CM | POA: Diagnosis not present

## 2017-11-22 DIAGNOSIS — I10 Essential (primary) hypertension: Secondary | ICD-10-CM | POA: Diagnosis not present

## 2017-11-22 DIAGNOSIS — J810 Acute pulmonary edema: Secondary | ICD-10-CM | POA: Diagnosis not present

## 2017-11-22 DIAGNOSIS — Z79899 Other long term (current) drug therapy: Secondary | ICD-10-CM | POA: Diagnosis not present

## 2017-11-22 DIAGNOSIS — I509 Heart failure, unspecified: Secondary | ICD-10-CM

## 2017-11-22 DIAGNOSIS — I447 Left bundle-branch block, unspecified: Secondary | ICD-10-CM | POA: Diagnosis present

## 2017-11-22 DIAGNOSIS — E785 Hyperlipidemia, unspecified: Secondary | ICD-10-CM | POA: Diagnosis present

## 2017-11-22 DIAGNOSIS — I429 Cardiomyopathy, unspecified: Secondary | ICD-10-CM | POA: Diagnosis present

## 2017-11-22 DIAGNOSIS — E876 Hypokalemia: Secondary | ICD-10-CM | POA: Diagnosis present

## 2017-11-22 DIAGNOSIS — Z7982 Long term (current) use of aspirin: Secondary | ICD-10-CM | POA: Diagnosis not present

## 2017-11-22 DIAGNOSIS — I5023 Acute on chronic systolic (congestive) heart failure: Secondary | ICD-10-CM | POA: Diagnosis present

## 2017-11-22 DIAGNOSIS — I110 Hypertensive heart disease with heart failure: Secondary | ICD-10-CM | POA: Diagnosis present

## 2017-11-22 DIAGNOSIS — I248 Other forms of acute ischemic heart disease: Secondary | ICD-10-CM | POA: Diagnosis present

## 2017-11-22 LAB — BASIC METABOLIC PANEL
ANION GAP: 13 (ref 5–15)
BUN: 19 mg/dL (ref 8–23)
CALCIUM: 9 mg/dL (ref 8.9–10.3)
CHLORIDE: 95 mmol/L — AB (ref 98–111)
CO2: 27 mmol/L (ref 22–32)
Creatinine, Ser: 1.02 mg/dL — ABNORMAL HIGH (ref 0.44–1.00)
GFR calc non Af Amer: 48 mL/min — ABNORMAL LOW (ref >60)
GFR, EST AFRICAN AMERICAN: 56 mL/min — AB (ref >60)
GLUCOSE: 105 mg/dL — AB (ref 70–99)
POTASSIUM: 3 mmol/L — AB (ref 3.5–5.1)
Sodium: 135 mmol/L (ref 135–145)

## 2017-11-22 LAB — TSH: TSH: 2.254 u[IU]/mL (ref 0.350–4.500)

## 2017-11-22 LAB — GLUCOSE, CAPILLARY: Glucose-Capillary: 150 mg/dL — ABNORMAL HIGH (ref 70–99)

## 2017-11-22 MED ORDER — FUROSEMIDE 40 MG PO TABS
40.0000 mg | ORAL_TABLET | Freq: Every day | ORAL | Status: DC
  Administered 2017-11-22 – 2017-11-25 (×4): 40 mg via ORAL
  Filled 2017-11-22 (×4): qty 1

## 2017-11-22 MED ORDER — POTASSIUM CHLORIDE CRYS ER 20 MEQ PO TBCR
40.0000 meq | EXTENDED_RELEASE_TABLET | Freq: Once | ORAL | Status: AC
  Administered 2017-11-22: 40 meq via ORAL
  Filled 2017-11-22: qty 2

## 2017-11-22 MED ORDER — ROSUVASTATIN CALCIUM 10 MG PO TABS
5.0000 mg | ORAL_TABLET | Freq: Every day | ORAL | Status: DC
  Administered 2017-11-22 – 2017-11-24 (×3): 5 mg via ORAL
  Filled 2017-11-22 (×3): qty 1

## 2017-11-22 NOTE — Progress Notes (Signed)
MD --Patient requests Lipitor not be given, but instead another med be in its place.  Patient reports Lipitor makes he have swelling.

## 2017-11-22 NOTE — Progress Notes (Signed)
Patient is weak and "give out"  With the urge to urinate siting on the side of bed checked o2 sat 80s Reapplied 02 and patient o2 sat slow went up to 94%-96% at rest

## 2017-11-22 NOTE — Progress Notes (Addendum)
Patient passed out going on the side of the bed Blood low 70/40s  MD aware at beside to to witness syncope episode. Plan to bolus if needed and adjust medications  Prior to episode patient had went to bathroom with nurse tech passed lots of flatus per patient and then come back to bed and felt lightheaded but felt the urge to go back to the back room to move bowels. This is when Advice worker entered the room along with rounding MD. Patient laid back with eyes fixed warm and clammy. Unresponsive for 87min RRT called from room. Blood sugar normal 140

## 2017-11-22 NOTE — Progress Notes (Signed)
Blood pressure up to 118/49 on the [facility] and feeling better.

## 2017-11-22 NOTE — Progress Notes (Signed)
Patient is alert and oriented denies dizziness or lightheaded. In Bed with blood pressure WNL at rest and standing.

## 2017-11-22 NOTE — Progress Notes (Signed)
PROGRESS NOTE    Haley Costa  GEX:[id number] DOB: 1928/10/31 DOA: 11/20/2017 PCP: Merrilee Seashore, MD   Brief Narrative:  84 year old woman with a history of hypertension, left bundle branch block, presenting on 11/20/2017 with chest pain and shortness of breath.  ED, she was found to have elevated BNP of 2052, negative troponins, tachycardia, tachypnea, O2 sats normal, afebrile.  Chest x-ray showed interstitial pulmonary edema, with moderate left and small right pleural effusion consistent with CHF. 2 D echo showed ICM, EF 25-30 percent, to be managed medically by patient's choice and after discussion with Cardiology.   Received Lasix 40 mg IV bid, with significant diuresis, and improvement of her symptoms. Diuretics are deescalating to oral and other cardiac meds were adjusted      Assessment & Plan:   Principal Problem:   Acute CHF (congestive heart failure) (HCC) Active Problems:   Chest pain   HTN (hypertension)  Acute CHF, new diagnosis she presented with shortness of breath, elevated BNP of 2000, bilateral lower extremity edema, positive JVD, pulmonary edema on chest x-ray. 2 D echo showed ICM, EF 25-30 percent.The patient received Lasix 40 mg IV twice daily, with significant diuresis and improvement of her symptoms. Cards consulted, Dr. Stanford Breed. No cath to be scheduled .  Change Lasix to oral, 40 mg daily Continue spironolactone  Hold Entresto and Coreg by Cards recommendations, can be resumed at a lower in am  Continue daily weights Strict I/O Continue low-salt diet with fluid restriction to 1 L daily Follow TSH results Appreciate Cards follow up   Chest pain syndrome, with initial negative troponins, then rising up to 0.15, last at 0.08 on 9/6 in view of demand ischemia due to above.  Initial EKG showing ST depression, and TWI in V5 and V6 likely due to demand ischemia secondary to CHF, versus ischemic heart disease.  Currently, she is chest pain-free. Continue  nitroglycerin, morphine and aspirin as needed Appreciate Cards consultation    Hypertension BP 125/69   Pulse 75  Hold Entresto and Carvedilol today  Continue home anti-hypertensive medications in am at a lower dose as per Cards recommendations     Hyperlipidemia LDL 142, with total cholesterol 205. Start Crestor 5 mg qhs as per Cards recommendations  Hypokalemia, may be due to diuretics.   Current K  3.0  Oral replenishment, Kdur 40 meq today Repeat BMET in am    DVT prophylaxis: Lovenox Code Status: Partial code, okay for CPR but no intubation  Family Communication: discussed with grandaughter  Disposition Plan:if okay with cards, patient may be discharged within the next 24 hours.  Consultants:   Cardiology, Dr. Stanford Breed. Appreciate involvement in medicine adjustment. Will need follow up as outpatient   Procedures:  2D echo 9/6  showed ICM, EF 25-30 percent.  Antimicrobials:   None   Subjective: Patient feeling better this morning, less short of breath after diuresing,  denies any chest pain or palpitations.  She verbalizes no complaints.  She denies any shortness of breath, or cough.  She denies any worsening lower extremity edema.  She is ambulating with assistance. Denies syncope or presyncope. Appetite normal, compliant w fluid restriction  Objective: Vitals:   11/22/17 0208 11/22/17 0516 11/22/17 0701 11/22/17 0822  BP: 106/65 (!) 122/57 (S) (!) 97/59 125/69  Pulse: 70 70 70 75  Resp: 17 18  16   Temp:  97.8 F (36.6 C)  (!) 97.5 F (36.4 C)  TempSrc:  Oral  Oral  SpO2: 96% 96%  Weight:  65.6 kg    Height:        Intake/Output Summary (Last 24 hours) at 11/22/2017 0900 Last data filed at 11/22/2017 0625 Gross per 24 hour  Intake 283 ml  Output 600 ml  Net -317 ml   Filed Weights   11/20/17 2326 11/21/17 0250 11/22/17 0516  Weight: 68 kg 68.3 kg 65.6 kg    Examination:  General exam: Appears calm and comfortable  Respiratory system: Trace of  crackles at the bases. Respiratory effort normal. Cardiovascular system: S1 & S2 heard, RRR. No JVD, no murmurs, rubs, gallops or clicks.  Trace of bilateral lower extremity edema. Gastrointestinal system: Abdomen is nondistended, soft and nontender. No organomegaly or masses felt. Normal bowel sounds heard. Central nervous system: Alert and oriented. No focal neurological deficits. Extremities: Symmetric 5 x 5 power. Skin: No rashes, lesions or ulcers Psychiatry: Judgement and insight appear normal. Mood & affect appropriate.      Data Reviewed: I have personally reviewed following labs and imaging studies  CBC: Recent Labs  Lab 11/20/17 2319 11/20/17 2328  WBC 8.7  --   NEUTROABS 5.2  --   HGB 13.1 13.3  HCT 40.4 39.0  MCV 95.1  --   PLT 268  --    Basic Metabolic Panel: Recent Labs  Lab 11/20/17 2328 11/22/17 0641  NA 132* 135  K 3.9 3.0*  CL 101 95*  CO2  --  27  GLUCOSE 152* 105*  BUN 15 19  CREATININE 0.90 1.02*  CALCIUM  --  9.0   GFR: Estimated Creatinine Clearance: 34.3 mL/min (A) (by C-G formula based on SCr of 1.02 mg/dL (H)). Liver Function Tests: No results for input(s): AST, ALT, ALKPHOS, BILITOT, PROT, ALBUMIN in the last 168 hours. No results for input(s): LIPASE, AMYLASE in the last 168 hours. No results for input(s): AMMONIA in the last 168 hours. Coagulation Profile: No results for input(s): INR, PROTIME in the last 168 hours. Cardiac Enzymes: Recent Labs  Lab 11/21/17 0438 11/21/17 1041 11/21/17 1615  TROPONINI 0.15* 0.10* 0.08*   BNP (last 3 results) No results for input(s): PROBNP in the last 8760 hours. HbA1C: Recent Labs    11/21/17 0438  HGBA1C 5.3   CBG: No results for input(s): GLUCAP in the last 168 hours. Lipid Profile: Recent Labs    11/21/17 0438  CHOL 205*  HDL 53  LDLCALC 142*  TRIG 52  CHOLHDL 3.9   Thyroid Function Tests: Recent Labs    11/22/17 0641  TSH 2.254   Anemia Panel: No results for input(s):  VITAMINB12, FOLATE, FERRITIN, TIBC, IRON, RETICCTPCT in the last 72 hours. Sepsis Labs: No results for input(s): PROCALCITON, LATICACIDVEN in the last 168 hours.  No results found for this or any previous visit (from the past 240 hour(s)).       Radiology Studies: Dg Chest 2 View  Result Date: 11/20/2017 CLINICAL DATA:  84 y/o  F; pain. EXAM: CHEST - 2 VIEW COMPARISON:  10/09/2017 thoracic spine radiographs FINDINGS: Moderate left and small right pleural effusions. Diffuse reticular and patchy basilar opacities. Normal cardiac silhouette given projection and technique. Calcific aortic atherosclerosis. Surgical clips project over the left axilla. Compression deformities of the thoracolumbar junction post kyphoplasty. No acute osseous abnormality is evident. IMPRESSION: Interstitial pulmonary edema with moderate left and small right pleural effusions. Basilar opacities probably represent associated atelectasis. Underlying pneumonia is possible. Electronically Signed   By: Kristine Garbe M.D.   On: 11/20/2017 23:54  Scheduled Meds: . aspirin EC  81 mg Oral Daily  . enoxaparin (LOVENOX) injection  40 mg Subcutaneous Q24H  . feeding supplement (ENSURE ENLIVE)  237 mL Oral BID BM  . furosemide  40 mg Oral Daily  . rosuvastatin  5 mg Oral q1800  . sodium chloride flush  3 mL Intravenous Q12H  . spironolactone  25 mg Oral Daily   Continuous Infusions: . sodium chloride       LOS: 0 days       Sharene Butters, MD [facility] Pager [phone]  If 7PM-7AM, please contact night-coverage [email or web address] Password [email or web address] 11/22/2017, 9:00 AM

## 2017-11-22 NOTE — Progress Notes (Signed)
Progress Note  Patient Name: Haley Costa Date of Encounter: 11/22/2017  Primary Cardiologist: New  Subjective   No chest pain or dyspnea  Inpatient Medications    Scheduled Meds: . aspirin EC  81 mg Oral Daily  . atorvastatin  10 mg Oral q1800  . carvedilol  3.125 mg Oral BID WC  . enoxaparin (LOVENOX) injection  40 mg Subcutaneous Q24H  . feeding supplement (ENSURE ENLIVE)  237 mL Oral BID BM  . furosemide  40 mg Intravenous Q12H  . sacubitril-valsartan  1 tablet Oral BID  . sodium chloride flush  3 mL Intravenous Q12H  . spironolactone  25 mg Oral Daily   Continuous Infusions: . sodium chloride     PRN Meds: sodium chloride, acetaminophen, hydrALAZINE, morphine injection, nitroGLYCERIN, ondansetron (ZOFRAN) IV, sodium chloride flush, zolpidem   Vital Signs    Vitals:   11/21/17 2300 11/22/17 0208 11/22/17 0516 11/22/17 0701  BP:  106/65 (!) 122/57 (S) (!) 97/59  Pulse:  70 70 70  Resp:  17 18   Temp:   97.8 F (36.6 C)   TempSrc:   Oral   SpO2: 95% 96% 96%   Weight:   65.6 kg   Height:        Intake/Output Summary (Last 24 hours) at 11/22/2017 0723 Last data filed at 11/22/2017 0625 Gross per 24 hour  Intake 283 ml  Output 1850 ml  Net -1567 ml   Filed Weights   11/20/17 2326 11/21/17 0250 11/22/17 0516  Weight: 68 kg 68.3 kg 65.6 kg    Telemetry    Sinus- Personally Reviewed   Physical Exam   GEN: No acute distress.   Neck: No JVD Cardiac: RRR, no murmurs, rubs, or gallops.  Respiratory: Mildly diminished BS LLL GI: Soft, nontender, non-distended  MS: No edema Neuro:  Nonfocal  Psych: Normal affect   Labs    Chemistry Recent Labs  Lab 11/20/17 2328  NA 132*  K 3.9  CL 101  GLUCOSE 152*  BUN 15  CREATININE 0.90     Hematology Recent Labs  Lab 11/20/17 2319 11/20/17 2328  WBC 8.7  --   RBC 4.25  --   HGB 13.1 13.3  HCT 40.4 39.0  MCV 95.1  --   MCH 30.8  --   MCHC 32.4  --   RDW 13.4  --   PLT 268  --      Cardiac Enzymes Recent Labs  Lab 11/21/17 0438 11/21/17 1041 11/21/17 1615  TROPONINI 0.15* 0.10* 0.08*    Recent Labs  Lab 11/20/17 2327  TROPIPOC 0.04     BNP Recent Labs  Lab 11/21/17 0025  BNP 2,052.4*     Radiology    Dg Chest 2 View  Result Date: 11/20/2017 CLINICAL DATA:  84 y/o  F; pain. EXAM: CHEST - 2 VIEW COMPARISON:  10/09/2017 thoracic spine radiographs FINDINGS: Moderate left and small right pleural effusions. Diffuse reticular and patchy basilar opacities. Normal cardiac silhouette given projection and technique. Calcific aortic atherosclerosis. Surgical clips project over the left axilla. Compression deformities of the thoracolumbar junction post kyphoplasty. No acute osseous abnormality is evident. IMPRESSION: Interstitial pulmonary edema with moderate left and small right pleural effusions. Basilar opacities probably represent associated atelectasis. Underlying pneumonia is possible. Electronically Signed   By: Kristine Garbe M.D.   On: 11/20/2017 23:54    Patient Profile     84 year old female with past medical history of hypertension, hyperlipidemia, left bundle branch block,  prior breast cancer for evaluation of acute systolic congestive heart failure. Electrocardiogram shows sinus rhythm with couplet and left bundle branch block.  Echocardiogram shows ejection fraction 25 to 30%, moderate mitral regurgitation and moderate left atrial enlargement.  Assessment & Plan    1 acute systolic congestive heart failure-[id number].  Volume status improving.  Change Lasix to 40 mg p.o. daily.  Continue spironolactone.  Follow potassium and renal function.  We discussed fluid restriction to 1 L daily and low-sodium diet.   2 cardiomyopathy-ejection fraction 25 to 30%.  Etiology unclear.  Check TSH.  No history of alcohol use.    We again discussed cardiac catheterization to exclude coronary disease.  However given her age she would prefer medical therapy  only.  I discussed the risks and benefits of catheterization and the risk of undiagnosed coronary disease and she again elects conservative measures.  Blood pressure has been borderline.  Change Lasix to 40 mg daily as outlined.  Hold Entresto and carvedilol today.  We will resume at lower doses in the next 24 hours if blood pressure improves.    3 hypertension-as above, blood pressure is borderline.  Medication adjustments as outlined.  For questions or updates, please contact [place] Please consult [email or web address] for contact info under        Signed, Kirk Ruths, MD  11/22/2017, 7:23 AM

## 2017-11-23 DIAGNOSIS — R079 Chest pain, unspecified: Secondary | ICD-10-CM

## 2017-11-23 DIAGNOSIS — I429 Cardiomyopathy, unspecified: Secondary | ICD-10-CM

## 2017-11-23 DIAGNOSIS — R55 Syncope and collapse: Secondary | ICD-10-CM

## 2017-11-23 LAB — BASIC METABOLIC PANEL
Anion gap: 10 (ref 5–15)
BUN: 23 mg/dL (ref 8–23)
CALCIUM: 8.9 mg/dL (ref 8.9–10.3)
CO2: 29 mmol/L (ref 22–32)
CREATININE: 0.96 mg/dL (ref 0.44–1.00)
Chloride: 97 mmol/L — ABNORMAL LOW (ref 98–111)
GFR calc Af Amer: >60 mL/min (ref >60)
GFR, EST NON AFRICAN AMERICAN: 52 mL/min — AB (ref >60)
Glucose, Bld: 108 mg/dL — ABNORMAL HIGH (ref 70–99)
Potassium: 3.5 mmol/L (ref 3.5–5.1)
SODIUM: 136 mmol/L (ref 135–145)

## 2017-11-23 MED ORDER — LOSARTAN POTASSIUM 25 MG PO TABS
25.0000 mg | ORAL_TABLET | Freq: Every day | ORAL | Status: DC
  Administered 2017-11-23 – 2017-11-25 (×3): 25 mg via ORAL
  Filled 2017-11-23 (×3): qty 1

## 2017-11-23 NOTE — Progress Notes (Signed)
Patient has loss of appetite and is concern about new diagnosis and medications. granddaughter is concern with her going home.  Plan to have PT/ OT  Evaluation and treatment.

## 2017-11-23 NOTE — Progress Notes (Addendum)
PROGRESS NOTE   Haley Costa  EHU:[id number]    DOB: 1928-11-08    DOA: 11/20/2017  PCP: Merrilee Seashore, MD   I have briefly reviewed patients previous medical records in [facility].  Brief Narrative:  84 year old female, independent of her activities, PMH of HTN, HLD, LBBB, breast cancer status post left mastectomy 1992 and chemotherapy, who presented to ED 9/6 with complaints of progressively worsening dyspnea 2 weeks duration and some epigastric discomfort but no chest pain.  She was admitted with acute systolic CHF.  Cardiology was consulted.   Assessment & Plan:   Principal Problem:   Acute CHF (congestive heart failure) (HCC) Active Problems:   Chest pain   HTN (hypertension)   Acute exacerbation of CHF (congestive heart failure) (HCC)   Acute systolic CHF: Cardiology consulted and directing care.  New cardiomyopathy of unclear etiology, discussion as below.  Initially placed on IV Lasix 40 mg twice daily, Aldactone added.  She was then transitioned to oral Lasix.  On 9/7, she developed hypotension, carvedilol and newly started [place] were held.  Her SBP's dropped to the 70s and she had an episode of syncope.  Cardiology has resumed Lasix 40 mg daily, continue to hold off of Aldactone.  New cardiomyopathy: Unclear etiology.  TTE: LVEF 25-30%.  TSH normal.  No history of alcohol abuse.  Cardiology has discussed cardiac cath with her but she has declined because of her age and prefers only medical treatment.  She understands that she could have undiagnosed CAD.  Low-dose Cozaar 25 mg daily initiated by cardiology on 9/8.  Medications are being carefully titrated due to episode of hypotension and syncope on 9/7.  Carvedilol will be considered during outpatient follow-up if blood pressure permits.  Essential hypertension: Hypotensive on 9/7, meds held and being gradually resumed as indicated above.  Blood pressures better today.  No orthostatic changes.  Syncope:  Witnessed by RN and MD on 9/7 while patient was sitting and being assisted by nursing, fell back in bed and passed out.  She quickly regained consciousness.  CBG 150 at that time.  BP 5 minutes after episode was 76/40.  Lasix, Aldactone, Entresto and carvedilol were held.  Chest pain/elevated troponin: Likely due to demand ischemia.  Not consistent with ACS.  Management per cardiology.  Patient declined cardiac cath and wishes medical management only.  Hyperlipidemia: LDL 142.  Rosuvastatin started by cardiology.  Hypokalemia: Replace and follow as needed.  History of breast cancer   DVT prophylaxis: Lovenox Code Status: Partial code/DNI. Family Communication: Discussed in detail with patient's granddaughter at bedside.  Updated care and answered questions. Disposition: DC home pending clinical improvement and PT evaluation, possibly 9/9.   Consultants:  Cardiology.  Procedures:  None  Antimicrobials:  None   Subjective: Patient states that she feels "100% better".  Still somewhat weak.  No dyspnea, chest pain, dizziness or lightheadedness even in upright position.  As per RN, negative orthostatic changes.  Lives at home and independent.  ROS: As above, otherwise negative.  Objective:  Vitals:   11/23/17 0455 11/23/17 0959 11/23/17 1005 11/23/17 1007  BP: (!) 141/53 (!) 94/53 132/72 131/90  Pulse: 91 79 91 97  Resp: 19     Temp: 98.5 F (36.9 C) 98.5 F (36.9 C)    TempSrc: Oral Oral    SpO2: 97% 95% 96% 96%  Weight: 64.5 kg     Height:        Examination:  General exam: Pleasant elderly female,  moderately built and nourished, lying comfortably supine in bed. Respiratory system: Occasional basal crackles but otherwise clear to auscultation. Respiratory effort normal. Cardiovascular system: S1 & S2 heard, RRR. No JVD, murmurs, rubs, gallops or clicks. No pedal edema.  Telemetry personally reviewed: Sinus rhythm. Gastrointestinal system: Abdomen is nondistended,  soft and nontender. No organomegaly or masses felt. Normal bowel sounds heard. Central nervous system: Alert and oriented. No focal neurological deficits. Extremities: Symmetric 5 x 5 power. Skin: No rashes, lesions or ulcers Psychiatry: Judgement and insight appear normal. Mood & affect appropriate.     Data Reviewed: I have personally reviewed following labs and imaging studies  CBC: Recent Labs  Lab 11/20/17 2319 11/20/17 2328  WBC 8.7  --   NEUTROABS 5.2  --   HGB 13.1 13.3  HCT 40.4 39.0  MCV 95.1  --   PLT 268  --    Basic Metabolic Panel: Recent Labs  Lab 11/20/17 2328 11/22/17 0641 11/23/17 0534  NA 132* 135 136  K 3.9 3.0* 3.5  CL 101 95* 97*  CO2  --  27 29  GLUCOSE 152* 105* 108*  BUN 15 19 23   CREATININE 0.90 1.02* 0.96  CALCIUM  --  9.0 8.9   Cardiac Enzymes: Recent Labs  Lab 11/21/17 0438 11/21/17 1041 11/21/17 1615  TROPONINI 0.15* 0.10* 0.08*   HbA1C: Recent Labs    11/21/17 0438  HGBA1C 5.3   CBG: Recent Labs  Lab 11/22/17 1133  GLUCAP 150*    No results found for this or any previous visit (from the past 240 hour(s)).       Radiology Studies: No results found.      Scheduled Meds: . aspirin EC  81 mg Oral Daily  . enoxaparin (LOVENOX) injection  40 mg Subcutaneous Q24H  . feeding supplement (ENSURE ENLIVE)  237 mL Oral BID BM  . furosemide  40 mg Oral Daily  . losartan  25 mg Oral Daily  . rosuvastatin  5 mg Oral q1800  . sodium chloride flush  3 mL Intravenous Q12H   Continuous Infusions: . sodium chloride       LOS: 1 day     Vernell Leep, MD, FACP, [facility]. [facility] Pager [phone]  If 7PM-7AM, please contact night-coverage [email or web address] Password [email or web address] 11/23/2017, 12:12 PM

## 2017-11-23 NOTE — Plan of Care (Signed)
  Problem: Activity: Goal: Ability to tolerate increased activity will improve Outcome: Progressing   

## 2017-11-23 NOTE — Evaluation (Signed)
Physical Therapy Evaluation Patient Details Name: Haley Costa MRN: [id number] DOB: 1928/10/26 Today's Date: 11/23/2017   History of Present Illness  84 year old female, independent of her activities, PMH of HTN, HLD, LBBB, breast cancer status post left mastectomy 1992 and chemotherapy, who presented to ED 9/6 with complaints of progressively worsening dyspnea 2 weeks duration and some epigastric discomfort but no chest pain.  She was admitted with acute systolic CHF.  Cardiology was consulted.    Clinical Impression  Pt admitted with above diagnosis. Pt currently with functional limitations due to the deficits listed below (see PT Problem List). PTA, pt living at home with son, independent with mobility without AD, driving etc. Upon eval pt walking hallway at supervision level without AD. BP 124/80, SpO2 WNL on RA. DGI 19/24. Plan to ensure stairs are comfortable with patient next PT visit, no concerns from family or this PT for return home with family supervision for mobility.  Pt will benefit from skilled PT to increase their independence and safety with mobility to allow discharge to the venue listed below.       Follow Up Recommendations Home health PT;Supervision for mobility/OOB    Equipment Recommendations  None recommended by PT    Recommendations for Other Services       Precautions / Restrictions Precautions Precautions: Fall Precaution Comments: watch BP Restrictions Weight Bearing Restrictions: No      Mobility  Bed Mobility Overal bed mobility: Independent                Transfers Overall transfer level: Independent Equipment used: None                Ambulation/Gait Ambulation/Gait assistance: Supervision Gait Distance (Feet): 120 Feet Assistive device: None Gait Pattern/deviations: WFL(Within Functional Limits) Gait velocity: normal   General Gait Details: pt ambulating without AD or hands on guarding. DGI 19/24  Stairs             Wheelchair Mobility    Modified Rankin (Stroke Patients Only)       Balance Overall balance assessment: Mild deficits observed, not formally tested(tolerating romberg EC for 30 seconds no sway. )                                           Pertinent Vitals/Pain Pain Assessment: No/denies pain    Home Living Family/patient expects to be discharged to:: Private residence Living Arrangements: Children;Other relatives Available Help at Discharge: Family;Available 24 hours/day Type of Home: House Home Access: Stairs to enter   [employer] of Steps: 3 Home Layout: One level Home Equipment: Walker - 2 wheels;Cane - quad      Prior Function Level of Independence: Independent with assistive device(s)         Comments: pt lives with son and independent with ADLs, drives, ambulates without AD     Hand Dominance   Dominant Hand: Right    Extremity/Trunk Assessment   Upper Extremity Assessment Upper Extremity Assessment: Overall WFL for tasks assessed    Lower Extremity Assessment Lower Extremity Assessment: (BLE strength 4-/5)       Communication   Communication: No difficulties  Cognition Arousal/Alertness: Awake/alert  General Comments      Exercises     Assessment/Plan    PT Assessment Patient needs continued PT services  PT Problem List Decreased strength;Decreased range of motion;Cardiopulmonary status limiting activity       PT Treatment Interventions Gait training;Stair training;Functional mobility training    PT Goals (Current goals can be found in the Care Plan section)  Acute Rehab PT Goals Patient Stated Goal: return home when ready PT Goal Formulation: With patient Time For Goal Achievement: 12/07/17 Potential to Achieve Goals: Good    Frequency Min 3X/week   Barriers to discharge        Co-evaluation               AM-PAC PT "6  Clicks" Daily Activity  Outcome Measure Difficulty turning over in bed (including adjusting bedclothes, sheets and blankets)?: None Difficulty moving from lying on back to sitting on the side of the bed? : None Difficulty sitting down on and standing up from a chair with arms (e.g., wheelchair, bedside commode, etc,.)?: None Help needed moving to and from a bed to chair (including a wheelchair)?: None Help needed walking in hospital room?: A Little Help needed climbing 3-5 steps with a railing? : A Little 6 Click Score: 22    End of Session Equipment Utilized During Treatment: Gait belt Activity Tolerance: Patient tolerated treatment well Patient left: in bed;with call bell/phone within reach;with family/visitor present Nurse Communication: Mobility status PT Visit Diagnosis: Unsteadiness on feet (R26.81)    Time: 4383-8184 PT Time Calculation (min) (ACUTE ONLY): 26 min   Charges:   PT Evaluation $PT Eval Low Complexity: 1 Low PT Treatments $Gait Training: 8-22 mins      Reinaldo Berber, PT, DPT Acute Rehabilitation Services Pager: [phone] Office: [place] 11/23/2017, 6:30 PM

## 2017-11-23 NOTE — Progress Notes (Signed)
Progress Note  Patient Name: Haley Costa Date of Encounter: 11/23/2017  Primary Cardiologist: New  Subjective   Pt denies CP or dyspnea  Inpatient Medications    Scheduled Meds: . aspirin EC  81 mg Oral Daily  . enoxaparin (LOVENOX) injection  40 mg Subcutaneous Q24H  . feeding supplement (ENSURE ENLIVE)  237 mL Oral BID BM  . furosemide  40 mg Oral Daily  . rosuvastatin  5 mg Oral q1800  . sodium chloride flush  3 mL Intravenous Q12H   Continuous Infusions: . sodium chloride     PRN Meds: sodium chloride, acetaminophen, nitroGLYCERIN, ondansetron (ZOFRAN) IV, sodium chloride flush, zolpidem   Vital Signs    Vitals:   11/22/17 1822 11/22/17 2131 11/22/17 2319 11/23/17 0455  BP: (!) 128/41 (!) 121/46 (!) 136/54 (!) 141/53  Pulse: 83 85 80 91  Resp: 16 18 18 19   Temp: 97.6 F (36.4 C) 98.2 F (36.8 C) 98.6 F (37 C) 98.5 F (36.9 C)  TempSrc:  Oral Oral Oral  SpO2:  98% 96% 97%  Weight:    64.5 kg  Height:        Intake/Output Summary (Last 24 hours) at 11/23/2017 0858 Last data filed at 11/23/2017 0458 Gross per 24 hour  Intake 580 ml  Output 1400 ml  Net -820 ml   Filed Weights   11/21/17 0250 11/22/17 0516 11/23/17 0455  Weight: 68.3 kg 65.6 kg 64.5 kg    Telemetry    Sinus- Personally Reviewed   Physical Exam   GEN: WD WN No acute distress.   Neck: No JVD, supple Cardiac: RRR  Respiratory: Mildly diminished BS bases GI: Soft, NT/ND MS: No edema Neuro:  Grossly intact   Labs    Chemistry Recent Labs  Lab 11/20/17 2328 11/22/17 0641 11/23/17 0534  NA 132* 135 136  K 3.9 3.0* 3.5  CL 101 95* 97*  CO2  --  27 29  GLUCOSE 152* 105* 108*  BUN 15 19 23   CREATININE 0.90 1.02* 0.96  CALCIUM  --  9.0 8.9  GFRNONAA  --  48* 52*  GFRAA  --  56* >60  ANIONGAP  --  13 10     Hematology Recent Labs  Lab 11/20/17 2319 11/20/17 2328  WBC 8.7  --   RBC 4.25  --   HGB 13.1 13.3  HCT 40.4 39.0  MCV 95.1  --   MCH 30.8  --     MCHC 32.4  --   RDW 13.4  --   PLT 268  --     Cardiac Enzymes Recent Labs  Lab 11/21/17 0438 11/21/17 1041 11/21/17 1615  TROPONINI 0.15* 0.10* 0.08*    Recent Labs  Lab 11/20/17 2327  TROPIPOC 0.04     BNP Recent Labs  Lab 11/21/17 0025  BNP 2,052.4*    Patient Profile     84 year old female with past medical history of hypertension, hyperlipidemia, left bundle branch block, prior breast cancer for evaluation of acute systolic congestive heart failure. Electrocardiogram shows sinus rhythm with couplet and left bundle branch block.  Echocardiogram shows ejection fraction 25 to 30%, moderate mitral regurgitation and moderate left atrial enlargement.  Assessment & Plan    1 acute systolic congestive heart failure-[id number].  Patient's blood pressure was low yesterday and carvedilol and Entresto were held.  Despite this she had an episode of hypotension with systolic blood pressure in the 70s associated with syncope.  Her diuretics were therefore held  yesterday and her blood pressure is much improved today.  I will resume Lasix 40 mg p.o. daily.  We will continue off of Spironolactone.  Follow potassium and renal function.  Follow blood pressure closely.  2 cardiomyopathy-ejection fraction 25 to 30%.  Etiology unclear.  TSH is normal.  She does not have a history of alcohol abuse.   We discussed cardiac catheterization previously.  However she declined because of her age.  She preferred only medical therapy.  She understands the risk of undiagnosed coronary disease.  I will resume low-dose ARB today (Cozaar 25 mg daily).  As outlined above she had hypotension yesterday.  Follow blood pressure closely and add carvedilol as an outpatient if blood pressure allows.     3 hypertension-blood pressure has improved this morning.  We are resuming lower dose Lasix and low-dose ARB.  Ambulate today.  If blood pressure and CHF symptoms stable could likely be discharged tomorrow with titration  of medications as an outpatient.  For questions or updates, please contact [place] Please consult [email or web address] for contact info under        Signed, Kirk Ruths, MD  11/23/2017, 8:58 AM

## 2017-11-23 NOTE — Progress Notes (Signed)
Patient orthostatic vitals were stable. Patient denies dizziness or light headedness. Weaning  O2 to 1L nasal canula. O2stat at 97%. Patient wants to sit on her chair. Paged MD for confirmation.  Bedrest currently.

## 2017-11-24 LAB — BASIC METABOLIC PANEL
ANION GAP: 10 (ref 5–15)
BUN: 24 mg/dL — ABNORMAL HIGH (ref 8–23)
CALCIUM: 8.7 mg/dL — AB (ref 8.9–10.3)
CO2: 29 mmol/L (ref 22–32)
CREATININE: 0.82 mg/dL (ref 0.44–1.00)
Chloride: 100 mmol/L (ref 98–111)
GLUCOSE: 104 mg/dL — AB (ref 70–99)
Potassium: 3.4 mmol/L — ABNORMAL LOW (ref 3.5–5.1)
Sodium: 139 mmol/L (ref 135–145)

## 2017-11-24 MED ORDER — POTASSIUM CHLORIDE CRYS ER 20 MEQ PO TBCR
40.0000 meq | EXTENDED_RELEASE_TABLET | Freq: Once | ORAL | Status: AC
  Administered 2017-11-24: 40 meq via ORAL
  Filled 2017-11-24: qty 2

## 2017-11-24 MED ORDER — CARVEDILOL 3.125 MG PO TABS
3.1250 mg | ORAL_TABLET | Freq: Two times a day (BID) | ORAL | Status: DC
  Administered 2017-11-24 – 2017-11-25 (×2): 3.125 mg via ORAL
  Filled 2017-11-24 (×2): qty 1

## 2017-11-24 NOTE — Progress Notes (Signed)
Physical Therapy Treatment Patient Details Name: Haley Costa MRN: [id number] DOB: [birth year] Today's Date: 11/24/2017    History of Present Illness 84 year old female, independent of her activities, PMH of HTN, HLD, LBBB, breast cancer status post left mastectomy 1992 and chemotherapy, who presented to ED 9/6 with complaints of progressively worsening dyspnea 2 weeks duration and some epigastric discomfort but no chest pain.  She was admitted with acute systolic CHF.  Cardiology was consulted.    PT Comments    Pt progressing towards all goals. Pt denies dizziness this session and amb without episode of LOB. Pt remains to have decreased activity tolerance but is functioning at supervision. Pt reports son to be home most of the time. Acute PT to cont to follow.    Follow Up Recommendations  Home health PT;Supervision for mobility/OOB     Equipment Recommendations  None recommended by PT    Recommendations for Other Services       Precautions / Restrictions Precautions Precautions: Fall Restrictions Weight Bearing Restrictions: No    Mobility  Bed Mobility Overal bed mobility: Independent             General bed mobility comments: no difficulty  Transfers Overall transfer level: Independent Equipment used: None             General transfer comment: no difficulty  Ambulation/Gait Ambulation/Gait assistance: Supervision Gait Distance (Feet): 150 Feet Assistive device: None Gait Pattern/deviations: Step-through pattern Gait velocity: slow   General Gait Details: pt with slower cadence and short step length but no episode of dizziness or lightheadedness or instability/LOB. Pt [id number], SpO2 >95% on RA.   Stairs Stairs: Yes Stairs assistance: Min guard Stair Management: One rail Right;Alternating pattern;Step to pattern Number of Stairs: 4(to mimic home) General stair comments: alternating up, step to down   Wheelchair Mobility    Modified  [place] (Stroke Patients Only)       Balance Overall balance assessment: Modified Independent                                          Cognition Arousal/Alertness: Awake/alert Behavior During Therapy: WFL for tasks assessed/performed Overall Cognitive Status: Within Functional Limits for tasks assessed                                        Exercises      General Comments General comments (skin integrity, edema, etc.): pt reports of fatiguing easily      Pertinent Vitals/Pain Pain Assessment: No/denies pain    Home Living                      Prior Function            PT Goals (current goals can now be found in the care plan section) Progress towards PT goals: Progressing toward goals    Frequency    Min 3X/week      PT Plan Current plan remains appropriate    Co-evaluation              AM-PAC PT "6 Clicks" Daily Activity  Outcome Measure  Difficulty turning over in bed (including adjusting bedclothes, sheets and blankets)?: None Difficulty moving from lying on back to sitting on the side of the bed? : None Difficulty sitting  down on and standing up from a chair with arms (e.g., wheelchair, bedside commode, etc,.)?: None Help needed moving to and from a bed to chair (including a wheelchair)?: None Help needed walking in hospital room?: A Little Help needed climbing 3-5 steps with a railing? : A Little 6 Click Score: 22    End of Session Equipment Utilized During Treatment: Gait belt Activity Tolerance: Patient tolerated treatment well Patient left: in chair;with call bell/phone within reach Nurse Communication: Mobility status PT Visit Diagnosis: Unsteadiness on feet (R26.81)     Time: 4129-0475 PT Time Calculation (min) (ACUTE ONLY): 22 min  Charges:  $Gait Training: 8-22 mins                     Kittie Plater, PT, DPT Acute Rehabilitation Services Pager #: [phone] Office #:  [phone]    Berline Lopes 11/24/2017, 11:47 AM

## 2017-11-24 NOTE — Progress Notes (Signed)
PROGRESS NOTE   Haley Costa  IRS:[id number]    DOB: 1928/04/04    DOA: 11/20/2017  PCP: Merrilee Seashore, MD   I have briefly reviewed patients previous medical records in [facility].  Brief Narrative:  84 year old female, independent of her activities, PMH of HTN, HLD, LBBB, breast cancer status post left mastectomy 1992 and chemotherapy, who presented to ED 9/6 with complaints of progressively worsening dyspnea 2 weeks duration and some epigastric discomfort but no chest pain.  She was admitted with acute systolic CHF.  Cardiology was consulted.   Assessment & Plan:   Principal Problem:   Acute CHF (congestive heart failure) (HCC) Active Problems:   Chest pain   HTN (hypertension)   Acute exacerbation of CHF (congestive heart failure) (HCC)   Acute systolic CHF: Cardiology consulted and directing care.  New cardiomyopathy of unclear etiology, discussion as below.  Initially placed on IV Lasix 40 mg twice daily, Aldactone added.  She was then transitioned to oral Lasix.  On 9/7, she developed hypotension with SBP in the 70s and had an episode of syncope following which carvedilol and newly started [place] were held.  Cardiology has resumed Lasix 40 mg daily, continue to hold off of Aldactone.  Continue Lasix 40 mg daily, losartan 25 mg daily, tolerating.  I discussed with Dr. Harrell Gave, initiated carvedilol 3.125 mg twice daily, monitor overnight and if stable possible DC home in a.m.  -5.5 L since admission.  Improving.  New cardiomyopathy: Unclear etiology.  TTE: LVEF 25-30%.  TSH normal.  No history of alcohol abuse.  Cardiology has discussed cardiac cath with her but she has declined because of her age and prefers only medical treatment.  She understands that she could have undiagnosed CAD.  Low-dose Cozaar 25 mg daily initiated by cardiology on 9/8.  Medications are being carefully titrated due to episode of hypotension and syncope on 9/7.  Carvedilol started 9/9.   Monitor.  Essential hypertension: Hypotensive on 9/7, meds held and being gradually resumed as indicated above.  Blood pressures better today.  No orthostatic symptoms.  Syncope: Witnessed by RN and MD on 9/7 while patient was sitting and being assisted by nursing, fell back in bed and passed out.  She quickly regained consciousness.  CBG 150 at that time.  BP 5 minutes after episode was 76/40.  Medication management as above.  Chest pain/elevated troponin: Likely due to demand ischemia.  Not consistent with ACS.  Management per cardiology.  Patient declined cardiac cath and wishes medical management only.  Hyperlipidemia: LDL 142.  Rosuvastatin started by cardiology.  Hypokalemia: Replace and follow as needed.  History of breast cancer   DVT prophylaxis: Lovenox Code Status: Partial code/DNI. Family Communication: Discussed in detail with patient's granddaughter at bedside.  Updated care and answered questions. Disposition: DC home pending clinical improvement and PT evaluation, possibly 9/10.   Consultants:  Cardiology.  Procedures:  None  Antimicrobials:  None   Subjective: No chest pain or dyspnea reported.  Still feels weak.  When she tried to ambulate with assistance to bathroom, felt slightly dizzy.  Indicates that she ambulated in the halls yesterday.  ROS: As above, otherwise negative.  Objective:  Vitals:   11/24/17 1009 11/24/17 1011 11/24/17 1013 11/24/17 1016  BP:      Pulse: 83 96 98 98  Resp:      Temp:      TempSrc:      SpO2:      Weight:  Height:      Temperature 97.5, pulse 98/min, respiratory rate 18/min, blood pressure 150/84, oxygen saturation 95%.  Examination:  General exam: Pleasant elderly female, moderately built and nourished, lying comfortably supine in bed. Respiratory system: Occasional basal crackles but otherwise clear to auscultation. Respiratory effort normal.  Stable. Cardiovascular system: S1 & S2 heard, RRR. No JVD,  murmurs, rubs, gallops or clicks. No pedal edema.  Telemetry personally reviewed: Sinus rhythm with BBB morphology. Gastrointestinal system: Abdomen is nondistended, soft and nontender. No organomegaly or masses felt. Normal bowel sounds heard.  Stable. Central nervous system: Alert and oriented. No focal neurological deficits. Extremities: Symmetric 5 x 5 power. Skin: No rashes, lesions or ulcers Psychiatry: Judgement and insight appear normal. Mood & affect appropriate.     Data Reviewed: I have personally reviewed following labs and imaging studies  CBC: Recent Labs  Lab 11/20/17 2319 11/20/17 2328  WBC 8.7  --   NEUTROABS 5.2  --   HGB 13.1 13.3  HCT 40.4 39.0  MCV 95.1  --   PLT 268  --    Basic Metabolic Panel: Recent Labs  Lab 11/20/17 2328 11/22/17 0641 11/23/17 0534 11/24/17 0627  NA 132* 135 136 139  K 3.9 3.0* 3.5 3.4*  CL 101 95* 97* 100  CO2  --  27 29 29   GLUCOSE 152* 105* 108* 104*  BUN 15 19 23  24*  CREATININE 0.90 1.02* 0.96 0.82  CALCIUM  --  9.0 8.9 8.7*   Cardiac Enzymes: Recent Labs  Lab 11/21/17 0438 11/21/17 1041 11/21/17 1615  TROPONINI 0.15* 0.10* 0.08*   HbA1C: No results for input(s): HGBA1C in the last 72 hours. CBG: Recent Labs  Lab 11/22/17 1133  GLUCAP 150*    No results found for this or any previous visit (from the past 240 hour(s)).       Radiology Studies: No results found.      Scheduled Meds: . aspirin EC  81 mg Oral Daily  . carvedilol  3.125 mg Oral BID WC  . enoxaparin (LOVENOX) injection  40 mg Subcutaneous Q24H  . feeding supplement (ENSURE ENLIVE)  237 mL Oral BID BM  . furosemide  40 mg Oral Daily  . losartan  25 mg Oral Daily  . rosuvastatin  5 mg Oral q1800  . sodium chloride flush  3 mL Intravenous Q12H   Continuous Infusions: . sodium chloride       LOS: 2 days     Vernell Leep, MD, FACP, [facility]. [facility] Pager [phone]  If 7PM-7AM, please contact  night-coverage [email or web address] Password [email or web address] 11/24/2017, 5:51 PM

## 2017-11-24 NOTE — Progress Notes (Signed)
Progress Note  Patient Name: Haley Costa Date of Encounter: 11/24/2017  Primary Cardiologist: Dr. Stanford Breed (new)  Subjective   No further syncopal events. She endorses feeling tired/weak but has been ambulating with assistance. No chest pain or shortness of breath.  Inpatient Medications    Scheduled Meds: . aspirin EC  81 mg Oral Daily  . enoxaparin (LOVENOX) injection  40 mg Subcutaneous Q24H  . feeding supplement (ENSURE ENLIVE)  237 mL Oral BID BM  . furosemide  40 mg Oral Daily  . losartan  25 mg Oral Daily  . rosuvastatin  5 mg Oral q1800  . sodium chloride flush  3 mL Intravenous Q12H   Continuous Infusions: . sodium chloride     PRN Meds: sodium chloride, acetaminophen, nitroGLYCERIN, ondansetron (ZOFRAN) IV, sodium chloride flush, zolpidem   Vital Signs    Vitals:   11/24/17 1009 11/24/17 1011 11/24/17 1013 11/24/17 1016  BP:      Pulse: 83 96 98 98  Resp:      Temp:      TempSrc:      SpO2:      Weight:      Height:        Intake/Output Summary (Last 24 hours) at 11/24/2017 1207 Last data filed at 11/24/2017 1111 Gross per 24 hour  Intake 580 ml  Output 2212 ml  Net -1632 ml   Filed Weights   11/22/17 0516 11/23/17 0455 11/24/17 0502  Weight: 65.6 kg 64.5 kg 65 kg    Telemetry    Sinus rhythm/sinus tachycardia- Personally Reviewed   Physical Exam   GEN: Pleasant female, resting comfortably in bed, in NAD Neck: No JVD, supple Cardiac: RRR  Respiratory: Mildly diminished BS bases GI: Soft, NT/ND MS: No edema Neuro:  Grossly intact   Labs    Chemistry Recent Labs  Lab 11/22/17 0641 11/23/17 0534 11/24/17 0627  NA 135 136 139  K 3.0* 3.5 3.4*  CL 95* 97* 100  CO2 27 29 29   GLUCOSE 105* 108* 104*  BUN 19 23 24*  CREATININE 1.02* 0.96 0.82  CALCIUM 9.0 8.9 8.7*  GFRNONAA 48* 52* >60  GFRAA 56* >60 >60  ANIONGAP 13 10 10      Hematology Recent Labs  Lab 11/20/17 2319 11/20/17 2328  WBC 8.7  --   RBC 4.25  --   HGB  13.1 13.3  HCT 40.4 39.0  MCV 95.1  --   MCH 30.8  --   MCHC 32.4  --   RDW 13.4  --   PLT 268  --     Cardiac Enzymes Recent Labs  Lab 11/21/17 0438 11/21/17 1041 11/21/17 1615  TROPONINI 0.15* 0.10* 0.08*    Recent Labs  Lab 11/20/17 2327  TROPIPOC 0.04     BNP Recent Labs  Lab 11/21/17 0025  BNP 2,052.4*    CV studies: Echo 11/21/17 Study Conclusions  - Left ventricle: The cavity size was normal. Wall thickness was   normal. Akinesis of the basal to mid inferior and inferoseptal   walls. The remaining walls were hypokinetic. Systolic function   was severely reduced. The estimated ejection fraction was in the   range of 25% to 30%. Doppler parameters are consistent with   abnormal left ventricular relaxation (grade 1 diastolic   dysfunction). - Aortic valve: There was no stenosis. - Mitral valve: Mildly calcified annulus. There was moderate   regurgitation. - Left atrium: The atrium was moderately dilated. - Right ventricle: The  cavity size was normal. Systolic function   was normal. - Tricuspid valve: Peak RV-RA gradient (S): 34 mm Hg. - Pulmonary arteries: PA peak pressure: 37 mm Hg (S). - Inferior vena cava: The vessel was normal in size. The   respirophasic diameter changes were in the normal range (= 50%),   consistent with normal central venous pressure. - Pericardium, extracardiac: There was a pleural effusion.  Impressions:  - Normal LV size with EF 25-30%. Akinesis of the basal to mid   inferior and inferoseptal walls, hypokinesis of the remainder of   the left ventricle. Normal RV size and systolic function.   Moderate mitral regurgitation. Mild pulmonary hypertension.  Patient Profile     84 year old female with past medical history of hypertension, hyperlipidemia, left bundle branch block, prior breast cancer seen for evaluation of acute systolic congestive heart failure. Electrocardiogram shows sinus rhythm with couplet and left bundle  branch block.  Echocardiogram shows ejection fraction 25 to 30%, moderate mitral regurgitation and moderate left atrial enlargement.  Assessment & Plan    1 acute systolic congestive heart failure -net negative 5 L total, weight from 68.3 kg to 65 kg today.  -holding entresto and spironolactone given hypotension with syncope on 11/22/17 -appears euvolemic on furosemide 40 mg daily dosing -tolerating losartan 25 mg daily. Continue -has had some BP even slightly over goal today, but given her history of hypotension, would allow her to trend slightly high on her systolic blood pressures to avoid lows when she stands.  -would retrial on low dose carvedilol, see below  2 new cardiomyopathy -ejection fraction 25 to 30%.  Etiology unclear.  Declined cardiac cath. Would not do stress test if she would not want intervention, so will treat medically.     3 hypertension -blood pressure stabilized. Will try low dose carvedilol 3.125 mg BID tonight.   For questions or updates, please contact [place] Please consult [email or web address] for contact info under     Signed, Buford Dresser, MD  11/24/2017, 12:07 PM

## 2017-11-25 DIAGNOSIS — E785 Hyperlipidemia, unspecified: Secondary | ICD-10-CM

## 2017-11-25 DIAGNOSIS — E876 Hypokalemia: Secondary | ICD-10-CM

## 2017-11-25 DIAGNOSIS — I5023 Acute on chronic systolic (congestive) heart failure: Secondary | ICD-10-CM

## 2017-11-25 DIAGNOSIS — I10 Essential (primary) hypertension: Secondary | ICD-10-CM

## 2017-11-25 LAB — BASIC METABOLIC PANEL WITH GFR
Anion gap: 12 (ref 5–15)
BUN: 22 mg/dL (ref 8–23)
CO2: 26 mmol/L (ref 22–32)
Calcium: 9.2 mg/dL (ref 8.9–10.3)
Chloride: 98 mmol/L (ref 98–111)
Creatinine, Ser: 0.91 mg/dL (ref 0.44–1.00)
GFR calc Af Amer: >60 mL/min
GFR calc non Af Amer: 56 mL/min — ABNORMAL LOW
Glucose, Bld: 99 mg/dL (ref 70–99)
Potassium: 4.2 mmol/L (ref 3.5–5.1)
Sodium: 136 mmol/L (ref 135–145)

## 2017-11-25 MED ORDER — ROSUVASTATIN CALCIUM 5 MG PO TABS: 5.0000 mg | ORAL_TABLET | Freq: Every day | ORAL | 0 refills | Status: DC

## 2017-11-25 MED ORDER — ASPIRIN 81 MG PO TBEC: 81.0000 mg | DELAYED_RELEASE_TABLET | Freq: Every day | ORAL | 0 refills | Status: DC

## 2017-11-25 MED ORDER — FUROSEMIDE 40 MG PO TABS: 40.0000 mg | ORAL_TABLET | Freq: Every day | ORAL | 0 refills | Status: DC

## 2017-11-25 MED ORDER — LOSARTAN POTASSIUM 25 MG PO TABS: 25.0000 mg | ORAL_TABLET | Freq: Every day | ORAL | 0 refills | Status: DC

## 2017-11-25 MED ORDER — CARVEDILOL 3.125 MG PO TABS: 3.1250 mg | ORAL_TABLET | Freq: Two times a day (BID) | ORAL | 0 refills | Status: DC

## 2017-11-25 NOTE — Progress Notes (Signed)
Progress Note  Patient Name: Haley Costa Date of Encounter: 11/25/2017  Primary Cardiologist: Dr. Stanford Breed (new)  Subjective   No further syncopal events. Son is here today. Feels up to going home.  Inpatient Medications    Scheduled Meds: . aspirin EC  81 mg Oral Daily  . carvedilol  3.125 mg Oral BID WC  . enoxaparin (LOVENOX) injection  40 mg Subcutaneous Q24H  . feeding supplement (ENSURE ENLIVE)  237 mL Oral BID BM  . furosemide  40 mg Oral Daily  . losartan  25 mg Oral Daily  . rosuvastatin  5 mg Oral q1800  . sodium chloride flush  3 mL Intravenous Q12H   Continuous Infusions: . sodium chloride     PRN Meds: sodium chloride, acetaminophen, nitroGLYCERIN, ondansetron (ZOFRAN) IV, sodium chloride flush, zolpidem   Vital Signs    Vitals:   11/24/17 1016 11/24/17 1934 11/25/17 0606 11/25/17 0628  BP:  112/67 (!) 142/65   Pulse: 98 85 95   Resp:  18 18   Temp:  98.5 F (36.9 C) 98.4 F (36.9 C)   TempSrc:  Oral Oral   SpO2:  96% 92% 95%  Weight:   60.4 kg   Height:        Intake/Output Summary (Last 24 hours) at 11/25/2017 0743 Last data filed at 11/25/2017 0247 Gross per 24 hour  Intake 600 ml  Output 600 ml  Net 0 ml   Filed Weights   11/23/17 0455 11/24/17 0502 11/25/17 0606  Weight: 64.5 kg 65 kg 60.4 kg    Telemetry    Sinus rhythm/sinus tachycardia. Brief SVT at 6:30 AM- Personally Reviewed   Physical Exam   GEN: Pleasant female, resting comfortably in chair, in NAD Neck: No JVD, supple Cardiac: RRR  Respiratory: Mildly diminished BS bases GI: Soft, NT/ND MS: No edema Neuro:  Grossly intact   Labs    Chemistry Recent Labs  Lab 11/22/17 0641 11/23/17 0534 11/24/17 0627  NA 135 136 139  K 3.0* 3.5 3.4*  CL 95* 97* 100  CO2 27 29 29   GLUCOSE 105* 108* 104*  BUN 19 23 24*  CREATININE 1.02* 0.96 0.82  CALCIUM 9.0 8.9 8.7*  GFRNONAA 48* 52* >60  GFRAA 56* >60 >60  ANIONGAP 13 10 10      Hematology Recent Labs  Lab  11/20/17 2319 11/20/17 2328  WBC 8.7  --   RBC 4.25  --   HGB 13.1 13.3  HCT 40.4 39.0  MCV 95.1  --   MCH 30.8  --   MCHC 32.4  --   RDW 13.4  --   PLT 268  --     Cardiac Enzymes Recent Labs  Lab 11/21/17 0438 11/21/17 1041 11/21/17 1615  TROPONINI 0.15* 0.10* 0.08*    Recent Labs  Lab 11/20/17 2327  TROPIPOC 0.04     BNP Recent Labs  Lab 11/21/17 0025  BNP 2,052.4*    CV studies: Echo 11/21/17 Study Conclusions  - Left ventricle: The cavity size was normal. Wall thickness was   normal. Akinesis of the basal to mid inferior and inferoseptal   walls. The remaining walls were hypokinetic. Systolic function   was severely reduced. The estimated ejection fraction was in the   range of 25% to 30%. Doppler parameters are consistent with   abnormal left ventricular relaxation (grade 1 diastolic   dysfunction). - Aortic valve: There was no stenosis. - Mitral valve: Mildly calcified annulus. There was moderate  regurgitation. - Left atrium: The atrium was moderately dilated. - Right ventricle: The cavity size was normal. Systolic function   was normal. - Tricuspid valve: Peak RV-RA gradient (S): 34 mm Hg. - Pulmonary arteries: PA peak pressure: 37 mm Hg (S). - Inferior vena cava: The vessel was normal in size. The   respirophasic diameter changes were in the normal range (= 50%),   consistent with normal central venous pressure. - Pericardium, extracardiac: There was a pleural effusion.  Impressions:  - Normal LV size with EF 25-30%. Akinesis of the basal to mid   inferior and inferoseptal walls, hypokinesis of the remainder of   the left ventricle. Normal RV size and systolic function.   Moderate mitral regurgitation. Mild pulmonary hypertension.  Patient Profile     84 year old female with past medical history of hypertension, hyperlipidemia, left bundle branch block, prior breast cancer seen for evaluation of acute systolic congestive heart failure.  Electrocardiogram shows sinus rhythm with couplet and left bundle branch block.  Echocardiogram shows ejection fraction 25 to 30%, moderate mitral regurgitation and moderate left atrial enlargement.  Assessment & Plan    1 acute systolic congestive heart failure -net negative 5.5 L total, weight from 68.3 kg to 60.4 kg today.  -holding entresto and spironolactone given hypotension with syncope on 11/22/17 -appears euvolemic on furosemide 40 mg daily dosing -tolerating losartan 25 mg daily. Continue -has had some BP elevation slightly over goal, but given her history of hypotension, would allow her to trend slightly high on her systolic blood pressures to avoid lows when she stands.  -tolerating low dose carvedilol  2 new cardiomyopathy -ejection fraction 25 to 30%.  Etiology unclear.  Declined cardiac cath. Would not do stress test if she would not want intervention, so will treat medically.     3 hypertension -blood pressure stabilized. Tolerated carvedilol 3.125 mg BID    [facility] will sign off in anticipation of discharge.   Medication Recommendations:  Carvedilol 3.125 mg BID, losartan 25 mg daily, furosemide 40 mg daily. Would supplement K today. Other recommendations (labs, testing, etc):  BMP in a week Follow up as an outpatient:  With Dr. Stanford Breed (we will arrange)  For questions or updates, please contact [place] Please consult [email or web address] for contact info under     Signed, Buford Dresser, MD  11/25/2017, 7:43 AM

## 2017-11-25 NOTE — Discharge Summary (Signed)
Physician Discharge Summary  Haley Costa TML:[id number] DOB: Dec 10, 1928  PCP: Merrilee Seashore, MD  Admit date: 11/20/2017 Discharge date: 11/25/2017  Recommendations for Outpatient Follow-up:  1. Dr. Merrilee Seashore, PCP in 1 week with repeat labs (CBC & BMP). Turner, PA/Cardiology on 12/09/2017 at 10 AM. 3. Recommend repeating Chest x-ray in about 4 weeks.  Home Health: PT Equipment/Devices: None  Discharge Condition: Improved and stable CODE STATUS: Partial/DNI Diet recommendation: Heart healthy diet.  Discharge Diagnoses:  Principal Problem:   Acute CHF (congestive heart failure) (HCC) Active Problems:   Chest pain   HTN (hypertension)   Acute exacerbation of CHF (congestive heart failure) (HCC)   Brief Summary: 85 year old female, independent of her activities, PMH of HTN, HLD, LBBB, breast cancer status post left mastectomy 1992 and chemotherapy, who presented to ED 9/6 with complaints of progressively worsening dyspnea 2 weeks duration and some epigastric discomfort but no chest pain.  She was admitted with acute systolic CHF.  Cardiology was consulted.   Assessment & Plan:   Acute systolic CHF: Cardiology consulted and directing care.  New cardiomyopathy of unclear etiology, discussion as below.  Initially placed on IV Lasix 40 mg twice daily, Aldactone added.  She was then transitioned to oral Lasix.  On 9/7, she developed hypotension with SBP in the 70s and had an episode of syncope following which carvedilol and newly started [place] were held.  Cardiology has resumed Lasix 40 mg daily, continue to hold off of Aldactone.  Continue Lasix 40 mg daily, losartan 25 mg daily, tolerating.    Carvedilol 3.125 mg twice daily was added which she has tolerated.  Cardiology has seen today and cleared her for discharge home on Carvedilol 3.125 mg twice daily, Losartan 25 mg daily, Lasix 40 mg daily.  I discussed with cardiology who recommended continuing  Aspirin 81 mg daily since ischemic etiology is not ruled out.  She is -5.6 L since admission.  Weight also down by 17 pounds since admission.  New cardiomyopathy: Unclear etiology.  TTE: LVEF 25-30%.  TSH normal/2.254.  No history of alcohol abuse.  Cardiology has discussed cardiac cath with her but she has declined because of her age and prefers only medical treatment.  She understands that she could have undiagnosed CAD.  Low-dose Cozaar 25 mg daily initiated by cardiology on 9/8.  Medications are being carefully titrated due to episode of hypotension and syncope on 9/7.  Carvedilol started 9/9.  Monitor.  Continue aspirin.  Essential hypertension: Hypotensive on 9/7, meds held and were gradually resumed as indicated above.  Blood pressures controlled.  No orthostatic symptoms.  Syncope: Witnessed by RN and MD on 9/7 while patient was sitting and being assisted by nursing, fell back in bed and passed out.  She quickly regained consciousness.  CBG 150 at that time.  BP 5 minutes after episode was 76/40.  Medication management as above.  Patient and family have been instructed that patient should not drive for 6 months or until cleared by her physicians during outpatient follow-up.  As at this she drove infrequently prior to admission.  They verbalized understanding.  Chest pain/elevated troponin: Likely due to demand ischemia.  Not consistent with ACS.  Management per cardiology.  Patient declined cardiac cath and wishes medical management only.  Continue aspirin, low-dose beta-blocker and statin.  Hyperlipidemia: LDL 142.  Rosuvastatin started by cardiology, continue.  Hypokalemia: Replaced.  History of breast cancer   Consultants:  Cardiology.  Procedures:  None  Discharge  Instructions  Discharge Instructions    (HEART FAILURE PATIENTS) Call MD:  Anytime you have any of the following symptoms: 1) 3 pound weight gain in 24 hours or 5 pounds in 1 week 2) shortness of breath, with  or without a dry hacking cough 3) swelling in the hands, feet or stomach 4) if you have to sleep on extra pillows at night in order to breathe.   Complete by:  As directed    Call MD for:   Complete by:  As directed    Passing out or feeling like passing out.   Call MD for:  difficulty breathing, headache or visual disturbances   Complete by:  As directed    Call MD for:  extreme fatigue   Complete by:  As directed    Call MD for:  persistant dizziness or light-headedness   Complete by:  As directed    Diet - low sodium heart healthy   Complete by:  As directed    Driving Restrictions   Complete by:  As directed    No driving for 6 months or until cleared to do so by your physicians during outpatient office follow-up.   Increase activity slowly   Complete by:  As directed        Medication List    STOP taking these medications   irbesartan 300 MG tablet Commonly known as:  AVAPRO   metoprolol tartrate 50 MG tablet Commonly known as:  LOPRESSOR     TAKE these medications   aspirin 81 MG EC tablet Take 1 tablet (81 mg total) by mouth daily. Start taking on:  11/26/2017   carvedilol 3.125 MG tablet Commonly known as:  COREG Take 1 tablet (3.125 mg total) by mouth 2 (two) times daily with a meal.   furosemide 40 MG tablet Commonly known as:  LASIX Take 1 tablet (40 mg total) by mouth daily. What changed:    medication strength  how much to take   losartan 25 MG tablet Commonly known as:  COZAAR Take 1 tablet (25 mg total) by mouth daily. Start taking on:  11/26/2017   rosuvastatin 5 MG tablet Commonly known as:  CRESTOR Take 1 tablet (5 mg total) by mouth daily at 6 PM.      Follow-up Information    [facility], Tami Lin, PA. Go on 12/09/2017.   Specialties:  Physician Assistant, Cardiology, Radiology Why:  @10am  for hospital follow up with Dr. Jacalyn Lefevre PA Contact information: [place] [id number] [phone]         Merrilee Seashore, MD. Go on 12/15/2017.   Specialty:  Internal Medicine Why:  To be seen with repeat labs (CBC & BMP).@2 :15pm Contact information: [address] [place] [address] [phone]          Allergies  Allergen Reactions  . Chlorthalidone   . Lisinopril   . Persantine [Dipyridamole]       Procedures/Studies: Dg Chest 2 View  Result Date: 11/20/2017 CLINICAL DATA:  84 y/o  F; pain. EXAM: CHEST - 2 VIEW COMPARISON:  10/09/2017 thoracic spine radiographs FINDINGS: Moderate left and small right pleural effusions. Diffuse reticular and patchy basilar opacities. Normal cardiac silhouette given projection and technique. Calcific aortic atherosclerosis. Surgical clips project over the left axilla. Compression deformities of the thoracolumbar junction post kyphoplasty. No acute osseous abnormality is evident. IMPRESSION: Interstitial pulmonary edema with moderate left and small right pleural effusions. Basilar opacities probably represent associated atelectasis. Underlying pneumonia  is possible. Electronically Signed   By: Kristine Garbe M.D.   On: 11/20/2017 23:54      Subjective: Patient denies complaints.  She is anxious to go home.  Denies chest pain, dyspnea, dizziness or lightheadedness.  Sitting up comfortably in chair.  As per RN, no acute issues noted.  Discharge Exam:  Vitals:   11/25/17 0606 11/25/17 0628 11/25/17 0809 11/25/17 1159  BP: (!) 142/65  137/73 119/66  Pulse: 95  86 83  Resp: 18   18  Temp: 98.4 F (36.9 C)     TempSrc: Oral     SpO2: 92% 95% 95% 95%  Weight: 60.4 kg     Height:        General exam: Pleasant elderly female, moderately built and nourished, sitting up comfortably in chair this morning. Respiratory system: Clear to auscultation. Respiratory effort normal.  Cardiovascular system: S1 & S2 heard, RRR. No JVD, murmurs, rubs, gallops or clicks.  Trace bilateral ankle edema.    Telemetry personally  reviewed: Sinus rhythm with BBB morphology. Gastrointestinal system: Abdomen is nondistended, soft and nontender. No organomegaly or masses felt. Normal bowel sounds heard.  Central nervous system: Alert and oriented. No focal neurological deficits. Extremities: Symmetric 5 x 5 power. Skin: No rashes, lesions or ulcers Psychiatry: Judgement and insight appear normal. Mood & affect appropriate.     The results of significant diagnostics from this hospitalization (including imaging, microbiology, ancillary and laboratory) are listed below for reference.     Labs: CBC: Recent Labs  Lab 11/20/17 2319 11/20/17 2328  WBC 8.7  --   NEUTROABS 5.2  --   HGB 13.1 13.3  HCT 40.4 39.0  MCV 95.1  --   PLT 268  --    Basic Metabolic Panel: Recent Labs  Lab 11/20/17 2328 11/22/17 0641 11/23/17 0534 11/24/17 0627 11/25/17 0818  NA 132* 135 136 139 136  K 3.9 3.0* 3.5 3.4* 4.2  CL 101 95* 97* 100 98  CO2  --  27 29 29 26   GLUCOSE 152* 105* 108* 104* 99  BUN 15 19 23  24* 22  CREATININE 0.90 1.02* 0.96 0.82 0.91  CALCIUM  --  9.0 8.9 8.7* 9.2    BNP (last 3 results) Recent Labs    11/21/17 0025  BNP 2,052.4*   Cardiac Enzymes: Recent Labs  Lab 11/21/17 0438 11/21/17 1041 11/21/17 1615  TROPONINI 0.15* 0.10* 0.08*   CBG: Recent Labs  Lab 11/22/17 1133  GLUCAP 150*      Time coordinating discharge: 35 minutes  SIGNED:  Vernell Leep, MD, FACP, [facility]. [facility] Pager [phone]  If 7PM-7AM, please contact night-coverage [email or web address] Password [email or web address] 11/25/2017, 1:13 PM

## 2017-11-25 NOTE — Care Management Note (Signed)
Case Management Note  Patient Details  Name: Haley Costa MRN: [id number] Date of Birth: October 03, 1928  Subjective/Objective:  CHF                 Action/Plan: CM talked to patient at the bedside for [facility] choices, pt refused all [facility] services. CM informed her that is she changed her mind and wanted [facility] after discharge to call her PCP and he can make the arrangements from the office.  Expected Discharge Date:  11/25/17               Expected Discharge Plan:  [facility]  Discharge planning Services  CM Consult  Choice offered to:  Patient  HH Arranged:  Patient Refused HH  Status of Service:  In process, will continue to follow  Sherrilyn Rist [phone] 11/25/2017, 2:16 PM

## 2017-11-25 NOTE — Care Management Important Message (Signed)
Important Message  Patient Details  Name: Haley Costa MRN: [id number] Date of Birth: 1928/07/11   Medicare Important Message Given:  Yes    Garrit Marrow P [place] 11/25/2017, 2:45 PM

## 2017-11-25 NOTE — Discharge Instructions (Signed)
Please get your medications reviewed and adjusted by your Primary MD. ° °Please request your Primary MD to go over all Hospital Tests and Procedure/Radiological results at the follow up, please get all Hospital records sent to your Prim MD by signing hospital release before you go home. ° °If you had Pneumonia of Lung problems at the Hospital: °Please get a 2 view Chest X ray done in 6-8 weeks after hospital discharge or sooner if instructed by your Primary MD. ° °If you have Congestive Heart Failure: °Please call your Cardiologist or Primary MD anytime you have any of the following symptoms:  °1) 3 pound weight gain in 24 hours or 5 pounds in 1 week  °2) shortness of breath, with or without a dry hacking cough  °3) swelling in the hands, feet or stomach  °4) if you have to sleep on extra pillows at night in order to breathe ° °Follow cardiac low salt diet and 1.5 lit/day fluid restriction. ° °If you have diabetes °Accuchecks 4 times/day, Once in AM empty stomach and then before each meal. °Log in all results and show them to your primary doctor at your next visit. °If any glucose reading is under 80 or above 300 call your primary MD immediately. ° °If you have Seizure/Convulsions/Epilepsy: °Please do not drive, operate heavy machinery, participate in activities at heights or participate in high speed sports until you have seen by Primary MD or a Neurologist and advised to do so again. ° °If you had Gastrointestinal Bleeding: °Please ask your Primary MD to check a complete blood count within one week of discharge or at your next visit. Your endoscopic/colonoscopic biopsies that are pending at the time of discharge, will also need to followed by your Primary MD. ° °Get Medicines reviewed and adjusted. °Please take all your medications with you for your next visit with your Primary MD ° °Please request your Primary MD to go over all hospital tests and procedure/radiological results at the follow up, please ask your  Primary MD to get all Hospital records sent to his/her office. ° °If you experience worsening of your admission symptoms, develop shortness of breath, life threatening emergency, suicidal or homicidal thoughts you must seek medical attention immediately by calling 911 or calling your MD immediately  if symptoms less severe. ° °You must read complete instructions/literature along with all the possible adverse reactions/side effects for all the Medicines you take and that have been prescribed to you. Take any new Medicines after you have completely understood and accpet all the possible adverse reactions/side effects.  ° °Do not drive or operate heavy machinery when taking Pain medications.  ° °Do not take more than prescribed Pain, Sleep and Anxiety Medications ° °Special Instructions: If you have smoked or chewed Tobacco  in the last 2 yrs please stop smoking, stop any regular Alcohol  and or any Recreational drug use. ° °Wear Seat belts while driving. ° °Please note °You were cared for by a hospitalist during your hospital stay. If you have any questions about your discharge medications or the care you received while you were in the hospital after you are discharged, you can call the unit and asked to speak with the hospitalist on call if the hospitalist that took care of you is not available. Once you are discharged, your primary care physician will handle any further medical issues. Please note that NO REFILLS for any discharge medications will be authorized once you are discharged, as it is imperative that you   return to your primary care physician (or establish a relationship with a primary care physician if you do not have one) for your aftercare needs so that they can reassess your need for medications and monitor your lab values.  You can reach the hospitalist office at phone [phone] or fax [phone]   If you do not have a primary care physician, you can call [phone] for a physician  referral.   Heart Failure Heart failure means your heart has trouble pumping blood. This makes it hard for your body to work well. Heart failure is usually a long-term (chronic) condition. You must take good care of yourself and follow your doctor's treatment plan. Follow these instructions at home:  Take your heart medicine as told by your doctor. ? Do not stop taking medicine unless your doctor tells you to. ? Do not skip any dose of medicine. ? Refill your medicines before they run out. ? Take other medicines only as told by your doctor or pharmacist.  Stay active if told by your doctor. The elderly and people with severe heart failure should talk with a doctor about physical activity.  Eat heart-healthy foods. Choose foods that are without trans fat and are low in saturated fat, cholesterol, and salt (sodium). This includes fresh or frozen fruits and vegetables, fish, lean meats, fat-free or low-fat dairy foods, whole grains, and high-fiber foods. Lentils and dried peas and beans (legumes) are also good choices.  Limit salt if told by your doctor.  Cook in a healthy way. Roast, grill, broil, bake, poach, steam, or stir-fry foods.  Limit fluids as told by your doctor.  Weigh yourself every morning. Do this after you pee (urinate) and before you eat breakfast. Write down your weight to give to your doctor.  Take your blood pressure and write it down if your doctor tells you to.  Ask your doctor how to check your pulse. Check your pulse as told.  Lose weight if told by your doctor.  Stop smoking or chewing tobacco. Do not use gum or patches that help you quit without your doctor's approval.  Schedule and go to doctor visits as told.  Nonpregnant women should have no more than 1 drink a day. Men should have no more than 2 drinks a day. Talk to your doctor about drinking alcohol.  Stop illegal drug use.  Stay current with shots (immunizations).  Manage your health conditions  as told by your doctor.  Learn to manage your stress.  Rest when you are tired.  If it is really hot outside: ? Avoid intense activities. ? Use air conditioning or fans, or get in a cooler place. ? Avoid caffeine and alcohol. ? Wear loose-fitting, lightweight, and light-colored clothing.  If it is really cold outside: ? Avoid intense activities. ? Layer your clothing. ? Wear mittens or gloves, a hat, and a scarf when going outside. ? Avoid alcohol.  Learn about heart failure and get support as needed.  Get help to maintain or improve your quality of life and your ability to care for yourself as needed. Contact a doctor if:  You gain weight quickly.  You are more short of breath than usual.  You cannot do your normal activities.  You tire easily.  You cough more than normal, especially with activity.  You have any or more puffiness (swelling) in areas such as your hands, feet, ankles, or belly (abdomen).  You cannot sleep because it is hard to breathe.  You feel like  your heart is beating fast (palpitations).  You get dizzy or light-headed when you stand up. Get help right away if:  You have trouble breathing.  There is a change in mental status, such as becoming less alert or not being able to focus.  You have chest pain or discomfort.  You faint. This information is not intended to replace advice given to you by your health care provider. Make sure you discuss any questions you have with your health care provider. Document Released: 12/12/2007 Document Revised: 08/10/2015 Document Reviewed: 04/20/2012 Elsevier Interactive Patient Education  2017 [employer]   Syncope Syncope is when you temporarily lose consciousness. Syncope may also be called fainting or passing out. It is caused by a sudden decrease in blood flow to the brain. Even though most causes of syncope are not dangerous, syncope can be a sign of a serious medical problem. Signs that you may be  about to faint include:  Feeling dizzy or light-headed.  Feeling nauseous.  Seeing all white or all black in your field of vision.  Having cold, clammy skin.  If you fainted, get medical help right away.Call your local emergency services (911 in the [place] Do not drive yourself to the hospital. Follow these instructions at home: Pay attention to any changes in your symptoms. Take these actions to help with your condition:  Have someone stay with you until you feel stable.  Do not drive, use machinery, or play sports until your health care provider says it is okay.  Keep all follow-up visits as told by your health care provider. This is important.  If you start to feel like you might faint, lie down right away and raise (elevate) your feet above the level of your heart. Breathe deeply and steadily. Wait until all of the symptoms have passed.  Drink enough fluid to keep your urine clear or pale yellow.  If you are taking blood pressure or heart medicine, get up slowly and take several minutes to sit and then stand. This can reduce dizziness.  Take over-the-counter and prescription medicines only as told by your health care provider.  Get help right away if:  You have a severe headache.  You have unusual pain in your chest, abdomen, or back.  You are bleeding from your mouth or rectum, or you have black or tarry stool.  You have a very fast or irregular heartbeat (palpitations).  You have pain with breathing.  You faint once or repeatedly.  You have a seizure.  You are confused.  You have trouble walking.  You have severe weakness.  You have vision problems. These symptoms may represent a serious problem that is an emergency. Do not wait to see if your symptoms will go away. Get medical help right away. Call your local emergency services (911 in the [place] Do not drive yourself to the hospital. This information is not intended to replace advice given to you by your  health care provider. Make sure you discuss any questions you have with your health care provider. Document Released: 03/04/2005 Document Revised: 08/10/2015 Document Reviewed: 11/16/2014 Elsevier Interactive Patient Education  [employer].

## 2017-12-08 NOTE — Progress Notes (Deleted)
Cardiology Office Note:    Date:  12/08/2017   ID:  Haley Costa, DOB [birth year], MRN [id number]  PCP:  Merrilee Seashore, MD  Cardiologist:  No primary care provider on file.   Referring MD: Merrilee Seashore, MD   No chief complaint on file. ***  History of Present Illness:    Haley Costa is a 84 y.o. female with a hx of hypertension (on ARB, ACEI allergy) HLD, LBBB, and breast cancer.  He presented to [facility], ER with complaints of chest pain and was found to the hypervolemic.  BNP was elevated to over 2000, troponins are mildly elevated and flat.  Chest x-ray with interstitial pulmonary edema and bilateral pleural effusion.  Echocardiogram on 11/21/2017 with an LVEF of 25 to 34%, grade 1 diastolic dysfunction, moderate mitral regurgitation, moderately dilated left atrium, and mild pulmonary hypertension. She was treated for acute onset systolic heart failure. She was diuresed with IV lasix and heart failure medications titrated. Etiology unknown, patient chose to defer cardiac catheterization and wished to be treated medically. BP unable to tolerate entresto and spironolactone. She was discharged on coreg 3.125 mg BID, 25 mg losartan, and 40 mg lasix daily with K supplementation.   She returns today for hospital follow up.        Past Medical History:  Diagnosis Date  . Cancer (HCC)    Breast  . Hypertension   . LBBB (left bundle branch block)    Patient states that she was told by her PCP that she has left bundle blockage    Past Surgical History:  Procedure Laterality Date  . APPENDECTOMY    . MASTECTOMY      Current Medications: No outpatient medications have been marked as taking for the 12/09/17 encounter (Appointment) with Ledora Bottcher, [place].     Allergies:   Chlorthalidone; Lisinopril; and Persantine [dipyridamole]   Social History   Socioeconomic History  . Marital status: Widowed    Spouse name: Not on file  . Number of children:  Not on file  . Years of education: Not on file  . Highest education level: Not on file  Occupational History  . Not on file  Social Needs  . Financial resource strain: Not on file  . Food insecurity:    Worry: Not on file    Inability: Not on file  . Transportation needs:    Medical: Not on file    Non-medical: Not on file  Tobacco Use  . Smoking status: Never Smoker  . Smokeless tobacco: Never Used  Substance and Sexual Activity  . Alcohol use: Not Currently  . Drug use: Never  . Sexual activity: Not on file  Lifestyle  . Physical activity:    Days per week: Not on file    Minutes per session: Not on file  . Stress: Not on file  Relationships  . Social connections:    Talks on phone: Not on file    Gets together: Not on file    Attends religious service: Not on file    Active member of club or organization: Not on file    Attends meetings of clubs or organizations: Not on file    Relationship status: Not on file  Other Topics Concern  . Not on file  Social History Narrative  . Not on file     Family History: The patient's ***family history includes Cervical cancer in her mother; Hypertension in her brother.  ROS:   Please see the  history of present illness.    *** All other systems reviewed and are negative.  EKGs/Labs/Other Studies Reviewed:    The following studies were reviewed today:  Echo 11/21/17: Study Conclusion - Left ventricle: The cavity size was normal. Wall thickness was   normal. Akinesis of the basal to mid inferior and inferoseptal   walls. The remaining walls were hypokinetic. Systolic function   was severely reduced. The estimated ejection fraction was in the   range of 25% to 30%. Doppler parameters are consistent with   abnormal left ventricular relaxation (grade 1 diastolic   dysfunction). - Aortic valve: There was no stenosis. - Mitral valve: Mildly calcified annulus. There was moderate   regurgitation. - Left atrium: The atrium was  moderately dilated. - Right ventricle: The cavity size was normal. Systolic function   was normal. - Tricuspid valve: Peak RV-RA gradient (S): 34 mm Hg. - Pulmonary arteries: PA peak pressure: 37 mm Hg (S). - Inferior vena cava: The vessel was normal in size. The   respirophasic diameter changes were in the normal range (= 50%),   consistent with normal central venous pressure. - Pericardium, extracardiac: There was a pleural effusion.  Impressions: - Normal LV size with EF 25-30%. Akinesis of the basal to mid   inferior and inferoseptal walls, hypokinesis of the remainder of   the left ventricle. Normal RV size and systolic function.   Moderate mitral regurgitation. Mild pulmonary hypertension.   EKG:  EKG is *** ordered today.  The ekg ordered today demonstrates ***  Recent Labs: 11/20/2017: Hemoglobin 13.3; Platelets 268 11/21/2017: B Natriuretic Peptide 2,052.4 11/22/2017: TSH 2.254 11/25/2017: BUN 22; Creatinine, Ser 0.91; Potassium 4.2; Sodium 136  Recent Lipid Panel    Component Value Date/Time   CHOL 205 (H) 11/21/2017 0438   TRIG 52 11/21/2017 0438   HDL 53 11/21/2017 0438   CHOLHDL 3.9 11/21/2017 0438   VLDL 10 11/21/2017 0438   LDLCALC 142 (H) 11/21/2017 0438    Physical Exam:    VS:  There were no vitals taken for this visit.    Wt Readings from Last 3 Encounters:  11/25/17 133 lb 1.6 oz (60.4 kg)  05/15/12 150 lb (68 kg)     GEN: *** Well nourished, well developed in no acute distress HEENT: Normal NECK: No JVD; No carotid bruits LYMPHATICS: No lymphadenopathy CARDIAC: ***RRR, no murmurs, rubs, gallops RESPIRATORY:  Clear to auscultation without rales, wheezing or rhonchi  ABDOMEN: Soft, non-tender, non-distended MUSCULOSKELETAL:  No edema; No deformity  SKIN: Warm and dry NEUROLOGIC:  Alert and oriented x 3 PSYCHIATRIC:  Normal affect   ASSESSMENT:    No diagnosis found. PLAN:    In order of problems listed above:  No diagnosis  found.   Medication Adjustments/Labs and Tests Ordered: Current medicines are reviewed at length with the patient today.  Concerns regarding medicines are outlined above.  No orders of the defined types were placed in this encounter.  No orders of the defined types were placed in this encounter.   Signed, Ledora Bottcher, [place]  12/08/2017 10:55 AM     [facility]

## 2017-12-09 ENCOUNTER — Encounter (HOSPITAL_COMMUNITY): Payer: Self-pay

## 2017-12-09 ENCOUNTER — Telehealth: Payer: Self-pay | Admitting: Physician Assistant

## 2017-12-09 ENCOUNTER — Ambulatory Visit: Payer: Medicare Other | Admitting: Physician Assistant

## 2017-12-09 ENCOUNTER — Other Ambulatory Visit: Payer: Self-pay

## 2017-12-09 ENCOUNTER — Observation Stay (HOSPITAL_COMMUNITY)
Admission: EM | Admit: 2017-12-09 | Discharge: 2017-12-10 | Disposition: A | Discharge status: Home / Self Care | Payer: Medicare Other | Attending: Internal Medicine | Admitting: Internal Medicine

## 2017-12-09 ENCOUNTER — Emergency Department (HOSPITAL_COMMUNITY): Payer: Medicare Other

## 2017-12-09 DIAGNOSIS — R110 Nausea: Secondary | ICD-10-CM

## 2017-12-09 DIAGNOSIS — R0902 Hypoxemia: Secondary | ICD-10-CM | POA: Diagnosis not present

## 2017-12-09 DIAGNOSIS — Z853 Personal history of malignant neoplasm of breast: Secondary | ICD-10-CM | POA: Diagnosis not present

## 2017-12-09 DIAGNOSIS — E871 Hypo-osmolality and hyponatremia: Secondary | ICD-10-CM | POA: Diagnosis not present

## 2017-12-09 DIAGNOSIS — E860 Dehydration: Secondary | ICD-10-CM | POA: Insufficient documentation

## 2017-12-09 DIAGNOSIS — Z79899 Other long term (current) drug therapy: Secondary | ICD-10-CM | POA: Diagnosis not present

## 2017-12-09 DIAGNOSIS — I429 Cardiomyopathy, unspecified: Secondary | ICD-10-CM | POA: Insufficient documentation

## 2017-12-09 DIAGNOSIS — Z7982 Long term (current) use of aspirin: Secondary | ICD-10-CM | POA: Insufficient documentation

## 2017-12-09 DIAGNOSIS — I499 Cardiac arrhythmia, unspecified: Secondary | ICD-10-CM | POA: Diagnosis not present

## 2017-12-09 DIAGNOSIS — I5022 Chronic systolic (congestive) heart failure: Secondary | ICD-10-CM | POA: Diagnosis not present

## 2017-12-09 DIAGNOSIS — E876 Hypokalemia: Secondary | ICD-10-CM | POA: Insufficient documentation

## 2017-12-09 DIAGNOSIS — E869 Volume depletion, unspecified: Secondary | ICD-10-CM | POA: Diagnosis not present

## 2017-12-09 DIAGNOSIS — I509 Heart failure, unspecified: Secondary | ICD-10-CM | POA: Diagnosis not present

## 2017-12-09 DIAGNOSIS — I110 Hypertensive heart disease with heart failure: Secondary | ICD-10-CM | POA: Diagnosis not present

## 2017-12-09 DIAGNOSIS — R2681 Unsteadiness on feet: Secondary | ICD-10-CM | POA: Insufficient documentation

## 2017-12-09 DIAGNOSIS — I10 Essential (primary) hypertension: Secondary | ICD-10-CM | POA: Diagnosis not present

## 2017-12-09 DIAGNOSIS — R5383 Other fatigue: Secondary | ICD-10-CM | POA: Diagnosis not present

## 2017-12-09 HISTORY — DX: Malignant neoplasm of unspecified site of left female breast: C50.912

## 2017-12-09 HISTORY — DX: Heart failure, unspecified: I50.9

## 2017-12-09 HISTORY — DX: Nausea with vomiting, unspecified: Z98.890

## 2017-12-09 HISTORY — DX: Dorsalgia, unspecified: M54.9

## 2017-12-09 HISTORY — DX: Nausea with vomiting, unspecified: R11.2

## 2017-12-09 HISTORY — DX: Unspecified osteoarthritis, unspecified site: M19.90

## 2017-12-09 HISTORY — DX: Personal history of urinary calculi: Z87.442

## 2017-12-09 HISTORY — DX: Cardiac murmur, unspecified: R01.1

## 2017-12-09 HISTORY — DX: Other chronic pain: G89.29

## 2017-12-09 HISTORY — DX: Pure hypercholesterolemia, unspecified: E78.00

## 2017-12-09 HISTORY — DX: Pneumonia, unspecified organism: J18.9

## 2017-12-09 LAB — COMPREHENSIVE METABOLIC PANEL
ALK PHOS: 87 U/L (ref 38–126)
ALT: 17 U/L (ref 0–44)
AST: 26 U/L (ref 15–41)
Albumin: 3.5 g/dL (ref 3.5–5.0)
Anion gap: 15 (ref 5–15)
BILIRUBIN TOTAL: 1.3 mg/dL — AB (ref 0.3–1.2)
BUN: 13 mg/dL (ref 8–23)
CALCIUM: 8.9 mg/dL (ref 8.9–10.3)
CO2: 26 mmol/L (ref 22–32)
CREATININE: 0.9 mg/dL (ref 0.44–1.00)
Chloride: 82 mmol/L — ABNORMAL LOW (ref 98–111)
GFR, EST NON AFRICAN AMERICAN: 56 mL/min — AB (ref >60)
Glucose, Bld: 123 mg/dL — ABNORMAL HIGH (ref 70–99)
Potassium: 2.9 mmol/L — ABNORMAL LOW (ref 3.5–5.1)
Sodium: 123 mmol/L — ABNORMAL LOW (ref 135–145)
Total Protein: 6.2 g/dL — ABNORMAL LOW (ref 6.5–8.1)

## 2017-12-09 LAB — LIPASE, BLOOD: Lipase: 35 U/L (ref 11–51)

## 2017-12-09 LAB — CBC
HCT: 35.9 % — ABNORMAL LOW (ref 36.0–46.0)
Hemoglobin: 12.6 g/dL (ref 12.0–15.0)
MCH: 31 pg (ref 26.0–34.0)
MCHC: 35.1 g/dL (ref 30.0–36.0)
MCV: 88.4 fL (ref 78.0–100.0)
PLATELETS: 260 10*3/uL (ref 150–400)
RBC: 4.06 MIL/uL (ref 3.87–5.11)
RDW: 11.9 % (ref 11.5–15.5)
WBC: 5.2 10*3/uL (ref 4.0–10.5)

## 2017-12-09 LAB — MAGNESIUM
Magnesium: 1.8 mg/dL (ref 1.7–2.4)
Magnesium: 2.6 mg/dL — ABNORMAL HIGH (ref 1.7–2.4)

## 2017-12-09 LAB — URINALYSIS, ROUTINE W REFLEX MICROSCOPIC
Bilirubin Urine: NEGATIVE
Glucose, UA: NEGATIVE mg/dL
HGB URINE DIPSTICK: NEGATIVE
KETONES UR: 20 mg/dL — AB
Leukocytes, UA: NEGATIVE
Nitrite: NEGATIVE
PROTEIN: NEGATIVE mg/dL
Specific Gravity, Urine: 1.005 (ref 1.005–1.030)
pH: 6 (ref 5.0–8.0)

## 2017-12-09 LAB — TSH: TSH: 1.091 u[IU]/mL (ref 0.350–4.500)

## 2017-12-09 LAB — SODIUM, URINE, RANDOM: Sodium, Ur: <10 mmol/L

## 2017-12-09 LAB — I-STAT TROPONIN, ED: Troponin i, poc: 0.01 ng/mL (ref 0.00–0.08)

## 2017-12-09 LAB — BRAIN NATRIURETIC PEPTIDE: B Natriuretic Peptide: 931.1 pg/mL — ABNORMAL HIGH (ref 0.0–100.0)

## 2017-12-09 LAB — CBG MONITORING, ED: Glucose-Capillary: 109 mg/dL — ABNORMAL HIGH (ref 70–99)

## 2017-12-09 LAB — PHOSPHORUS: Phosphorus: 2.6 mg/dL (ref 2.5–4.6)

## 2017-12-09 MED ORDER — POTASSIUM CHLORIDE IN NACL 40-0.9 MEQ/L-% IV SOLN
INTRAVENOUS | Status: DC
  Administered 2017-12-09: 50 mL/h via INTRAVENOUS
  Filled 2017-12-09 (×2): qty 1000

## 2017-12-09 MED ORDER — POTASSIUM CHLORIDE CRYS ER 20 MEQ PO TBCR
40.0000 meq | EXTENDED_RELEASE_TABLET | ORAL | Status: AC
  Administered 2017-12-09 (×2): 40 meq via ORAL
  Filled 2017-12-09 (×2): qty 2

## 2017-12-09 MED ORDER — SODIUM CHLORIDE 0.9 % IV SOLN: Freq: Once | INTRAVENOUS | Status: DC

## 2017-12-09 MED ORDER — ASPIRIN EC 81 MG PO TBEC
81.0000 mg | DELAYED_RELEASE_TABLET | Freq: Every day | ORAL | Status: DC
  Administered 2017-12-10: 81 mg via ORAL
  Filled 2017-12-09: qty 1

## 2017-12-09 MED ORDER — MAGNESIUM SULFATE 2 GM/50ML IV SOLN
2.0000 g | Freq: Once | INTRAVENOUS | Status: AC
  Administered 2017-12-09: 2 g via INTRAVENOUS
  Filled 2017-12-09: qty 50

## 2017-12-09 MED ORDER — POTASSIUM CHLORIDE CRYS ER 20 MEQ PO TBCR
40.0000 meq | EXTENDED_RELEASE_TABLET | Freq: Once | ORAL | Status: AC
  Administered 2017-12-09: 40 meq via ORAL
  Filled 2017-12-09: qty 2

## 2017-12-09 MED ORDER — ONDANSETRON HCL 4 MG/2ML IJ SOLN
4.0000 mg | Freq: Once | INTRAMUSCULAR | Status: DC
  Filled 2017-12-09: qty 2

## 2017-12-09 MED ORDER — CARVEDILOL 3.125 MG PO TABS
3.1250 mg | ORAL_TABLET | Freq: Two times a day (BID) | ORAL | Status: DC
  Administered 2017-12-09 – 2017-12-10 (×2): 3.125 mg via ORAL
  Filled 2017-12-09 (×2): qty 1

## 2017-12-09 MED ORDER — LOSARTAN POTASSIUM 50 MG PO TABS
25.0000 mg | ORAL_TABLET | Freq: Every day | ORAL | Status: DC
  Administered 2017-12-10: 25 mg via ORAL
  Filled 2017-12-09: qty 1

## 2017-12-09 MED ORDER — ENOXAPARIN SODIUM 40 MG/0.4ML ~~LOC~~ SOLN
40.0000 mg | SUBCUTANEOUS | Status: DC
  Administered 2017-12-09: 40 mg via SUBCUTANEOUS
  Filled 2017-12-09: qty 0.4

## 2017-12-09 MED ORDER — POTASSIUM CHLORIDE 10 MEQ/100ML IV SOLN
10.0000 meq | INTRAVENOUS | Status: AC
  Administered 2017-12-09 (×2): 10 meq via INTRAVENOUS
  Filled 2017-12-09 (×2): qty 100

## 2017-12-09 MED ORDER — ROSUVASTATIN CALCIUM 10 MG PO TABS
5.0000 mg | ORAL_TABLET | Freq: Every day | ORAL | Status: DC
  Administered 2017-12-09: 5 mg via ORAL
  Filled 2017-12-09: qty 1

## 2017-12-09 NOTE — Telephone Encounter (Signed)
Returned call to patient's grand daughter Haley Costa.She stated she had to cancel post hospital appointment with Fabian Sharp PA this morning.Stated grandmother did not sleep last night due to nausea and heart fluttering.Stated she continues to feel sick, nausea.No chest pain.Stated she will take her to a urgent care or ED.She will call back to reschedule post hospital appointment.

## 2017-12-09 NOTE — ED Provider Notes (Signed)
Medical screening examination/treatment/procedure(s) were conducted as a shared visit with non-physician practitioner(s) and myself.  I personally evaluated the patient during the encounter.  84 year old female who just recently discharged from the hospital on a bunch of new medications and has been persistently nauseous since then.  No vomiting but has a decreased appetite significant weight loss.  Some the weight loss is from diuretics and some of it is from her not wanting to eat. EKG is unremarkable.  She does found to be hyponatremic and hypokalemic consistent with her new diuretics.  Suspect is probably related to her nausea.  We will plan for slow correction with normal saline infusion, oral and IV potassium along with IV magnesium.  Plan for observation to ensure that nausea and sodium are improving.   EKG Interpretation  Date/Time:  Tuesday December 09 2017 12:10:06 EDT Ventricular Rate:  77 PR Interval:    QRS Duration: 143 QT Interval:  454 QTC Calculation: 514 R Axis:   -30 Text Interpretation:  Sinus rhythm Left bundle branch block No significant change since last tracing Confirmed by Merrily Pew [phone]) on 12/09/2017 3:15:55 PM      Prabhjot Piscitello, Corene Cornea, MD 12/09/17 1549

## 2017-12-09 NOTE — ED Provider Notes (Signed)
[facility] EMERGENCY DEPARTMENT Provider Note   CSN: [id number] Arrival date & time: 12/09/17  1104     History   Chief Complaint Chief Complaint  Patient presents with  . Nausea    HPI Haley Costa is a 84 y.o. female with history of breast cancer, hypertension, left bundle branch block, CHF presents for evaluation of acute onset, progressively worsening nausea intermittently for 2 weeks.  Recently admitted for CHF exacerbation on 11/20/2017 and discharged on 11/25/2017.  Found to have new cardiomyopathy and CHF exacerbation at the time.  She was started on Lasix, carvedilol, and Entresto.  Discharged on carvedilol, losartan, Lasix, and instructed to continue baby aspirin at the time.  She notes that she had been experiencing nausea at the time but it significantly has worsened over the past 2 weeks.  Notes decreased appetite and decreased oral intake.  Denies abdominal pain, vomiting, diarrhea, or urinary symptoms.  She denies any chest pain but does note some intermittent shortness of breath and lightheadedness specifically with position changes on exertion.  Denies syncope.  Family states that she is only been drinking Ensure and has not been eating much otherwise.  No fevers or chills.  Was given Zofran prior to arrival with improvement in her nausea.  The history is provided by the patient.    Past Medical History:  Diagnosis Date  . Cancer (HCC)    Breast  . Hypertension   . LBBB (left bundle branch block)    Patient states that she was told by her PCP that she has left bundle blockage    Patient Active Problem List   Diagnosis Date Noted  . Acute exacerbation of CHF (congestive heart failure) ([place]) 11/22/2017  . Chest pain 11/21/2017  . Acute CHF (congestive heart failure) ([place]) 11/21/2017  . HTN (hypertension) 11/21/2017    Past Surgical History:  Procedure Laterality Date  . APPENDECTOMY    . MASTECTOMY       OB History   None       Home Medications    Prior to Admission medications   Medication Sig Start Date End Date Taking? Authorizing Provider  aspirin EC 81 MG EC tablet Take 1 tablet (81 mg total) by mouth daily. 11/26/17  Yes [name], [name], MD  carvedilol (COREG) 3.125 MG tablet Take 1 tablet (3.125 mg total) by mouth 2 (two) times daily with a meal. 11/25/17  Yes [name], [name], MD  furosemide (LASIX) 40 MG tablet Take 1 tablet (40 mg total) by mouth daily. 11/25/17  Yes [name], [name], MD  losartan (COZAAR) 25 MG tablet Take 1 tablet (25 mg total) by mouth daily. 11/26/17  Yes [name], [name], MD  rosuvastatin (CRESTOR) 5 MG tablet Take 1 tablet (5 mg total) by mouth daily at 6 PM. 11/25/17  Yes [name], [name], MD    Family History Family History  Problem Relation Age of Onset  . Cervical cancer Mother   . Hypertension Brother     Social History Social History   Tobacco Use  . Smoking status: Never Smoker  . Smokeless tobacco: Never Used  Substance Use Topics  . Alcohol use: Not Currently  . Drug use: Never     Allergies   Chlorthalidone; Lisinopril; and Persantine [dipyridamole]   Review of Systems Review of Systems  Constitutional: Positive for fatigue. Negative for chills and fever.  Respiratory: Positive for shortness of breath.   Cardiovascular: Negative for chest pain and leg swelling.  Gastrointestinal: Positive for constipation and nausea. Negative for abdominal pain and vomiting.  Genitourinary: Negative for dysuria, frequency, hematuria and urgency.  Neurological: Positive for light-headedness. Negative for syncope.  All other systems reviewed and are negative.    Physical Exam Updated Vital Signs BP 135/69   Pulse 74   Temp 98.7 F (37.1 C) (Oral)   Resp 13   Ht 5\' 1"  (1.549 m)   Wt 64.9 kg   SpO2 96%   BMI 27.02 kg/m   Physical Exam  Constitutional: She appears well-developed and well-nourished. No distress.  Resting comfortably in bed  HENT:   Head: Normocephalic and atraumatic.  Eyes: Conjunctivae are normal. Right eye exhibits no discharge. Left eye exhibits no discharge.  Neck: Normal range of motion. Neck supple. No JVD present. No tracheal deviation present.  Cardiovascular: Normal rate, regular rhythm and intact distal pulses.  2+ radial and DP/PT pulses bilaterally, Homans sign absent bilaterally, no lower extremity edema, no palpable cords, compartments are soft   Pulmonary/Chest: Effort normal and breath sounds normal. No stridor. No respiratory distress. She has no wheezes. She has no rales. She exhibits no tenderness.  Abdominal: Soft. Bowel sounds are normal. She exhibits no distension. There is no tenderness. There is no guarding.  Musculoskeletal: She exhibits no edema.  Neurological: She is alert. No cranial nerve deficit or sensory deficit. She exhibits normal muscle tone.  Skin: Skin is warm and dry. No erythema.  Psychiatric: She has a normal mood and affect. Her behavior is normal.  Nursing note and vitals reviewed.    ED Treatments / Results  Labs (all labs ordered are listed, but only abnormal results are displayed) Labs Reviewed  COMPREHENSIVE METABOLIC PANEL - Abnormal; Notable for the following components:      Result Value   Sodium 123 (*)    Potassium 2.9 (*)    Chloride 82 (*)    Glucose, Bld 123 (*)    Total Protein 6.2 (*)    Total Bilirubin 1.3 (*)    GFR calc non Af Amer 56 (*)    All other components within normal limits  CBC - Abnormal; Notable for the following components:   HCT 35.9 (*)    All other components within normal limits  URINALYSIS, ROUTINE W REFLEX MICROSCOPIC - Abnormal; Notable for the following components:   Ketones, ur 20 (*)    All other components within normal limits  BRAIN NATRIURETIC PEPTIDE - Abnormal; Notable for the following components:   B Natriuretic Peptide 931.1 (*)    All other components within normal limits  CBG MONITORING, ED - Abnormal; Notable  for the following components:   Glucose-Capillary 109 (*)    All other components within normal limits  LIPASE, BLOOD  MAGNESIUM  I-STAT TROPONIN, ED    EKG EKG Interpretation  Date/Time:  Tuesday December 09 2017 12:10:06 EDT Ventricular Rate:  77 PR Interval:    QRS Duration: 143 QT Interval:  454 QTC Calculation: 514 R Axis:   -30 Text Interpretation:  Sinus rhythm Left bundle branch block No significant change since last tracing Confirmed by Merrily Pew [phone]) on 12/09/2017 3:15:55 PM   Radiology Dg Chest 2 View  Result Date: 12/09/2017 CLINICAL DATA:  84 year old female recently treated for CHF. Nausea and weakness this morning. EXAM: CHEST - 2 VIEW COMPARISON:  Chest radiographs 11/20/2017. FINDINGS: Resolved bilateral pleural effusions and pulmonary interstitial edema since 11/20/2017. Mild pulmonary hyperinflation. Cardiac size at the upper limits of normal. Other mediastinal contours  are within normal limits. Visualized tracheal air column is within normal limits. Both lungs appear clear today. No pneumothorax. Chronic lower thoracic and/or upper lumbar augmented compression fractures. Osteopenia. No acute osseous abnormality identified. Negative visible bowel gas pattern. IMPRESSION: Resolved edema and effusions.  No acute cardiopulmonary abnormality. Electronically Signed   By: Genevie Ann M.D.   On: 12/09/2017 13:00    Procedures Procedures (including critical care time)  Medications Ordered in ED Medications  ondansetron (ZOFRAN) injection 4 mg (has no administration in time range)  potassium chloride 10 mEq in 100 mL IVPB (10 mEq Intravenous New Bag/Given 12/09/17 1345)  magnesium sulfate IVPB 2 g 50 mL (2 g Intravenous New Bag/Given 12/09/17 1344)  potassium chloride SA (K-DUR,KLOR-CON) CR tablet 40 mEq (40 mEq Oral Given 12/09/17 1403)     Initial Impression / Assessment and Plan / ED Course  I have reviewed the triage vital signs and the nursing  notes.  Pertinent labs & imaging results that were available during my care of the patient were reviewed by me and considered in my medical decision making (see chart for details).     Patient with complaint of nausea for 2 weeks as well as decreased appetite.  She is afebrile, vital signs are stable.  She is nontoxic in appearance but does appear somewhat dehydrated.  Recently admitted for new onset CHF and started on carvedilol, losartan, and Lasix.  Has been compliant with these medications.  Chest x-ray shows resolving edema and effusions with no acute cardiopulmonary abnormality and her BNP has improved.  However remainder of lab work is significant for hyponatremia of 123 and hypokalemia of 2.9.  No acute EKG changes.  UA suggestive of dehydration with ketonuria but no evidence of UTI or nephrolithiasis.  She is not hypoglycemic.  Remainder of lab work overall unremarkable.  Suspect electrolyte abnormalities secondary to overdiuresis with Lasix and dehydration from decreased oral intake.  Would benefit from admission for monitoring and correction of electrolyte abnormalities.  Spoke with Dr. Marthenia Rolling with [facility] hospitalist service who agrees to assume care of patient and bring her into the hospital for further evaluation and management.  Patient seen and evaluated by Dr. Dayna Barker who agrees with assessment and plan at this time.  Final Clinical Impressions(s) / ED Diagnoses   Final diagnoses:  Hyponatremia  Hypokalemia  Nausea in adult patient    ED Discharge Orders    None       Debroah Baller 12/09/17 1526    Mesner, Corene Cornea, MD 12/09/17 1549

## 2017-12-09 NOTE — Telephone Encounter (Addendum)
° °  Patients granddaughter calling to report  Patient had appt today - felt too sick to come    STAT if patient feels like he/she is going to faint   1) Are you dizzy now? No  2) Do you feel faint or have you passed out? Feels faint  3) Do you have any other symptoms? nausea  4) Have you checked your HR and BP (record if available)? 162/84 HR 85

## 2017-12-09 NOTE — Progress Notes (Signed)
Patient trasfered from ED to 5W02 via wheelchair; alert and oriented x 4; no complaints of pain; IV in RAC running fluids at 100 cc/hr; skin intact; tele monitor box 34 placed by MD order.  Orient patient to room and unit; gave patient care guide; instructed how to use the call bell and  fall risk precautions. Will continue to monitor the patient.

## 2017-12-09 NOTE — H&P (Signed)
History and Physical  Haley Costa JJO:[id number] DOB: 1929-01-18 DOA: 12/09/2017  Referring physician: ER physician PCP: Merrilee Seashore, MD  Outpatient Specialists:    Patient coming from: Home  Chief Complaint: Weakness and fatigue.  HPI:  Patient is an 84 year old female with past medical history significant for hypertension, left bundle branch block, recent diagnosis of congestive heart failure.  Apparently, patient was discharged from the hospital about 3 weeks ago on oral Lasix after treatment for new onset acute congestive heart failure.  Patient was discharged on Lasix 40 mg p.o. once daily.  Over the last several days, patient has developed weakness, fatigue, nausea, poor p.o. intake and loss of appetite.  On presentation to the hospital, patient was found to have sodium of 123 and potassium of 2.9.  Patient was also volume depleted.  No headache, no neck pain, no chest pain, no shortness of breath, no fever or chills, no nausea or vomiting, no diarrhea no urinary symptoms.  Patient will be admitted for further assessment and management.  ED Course: Work-up for electrolyte abnormalities in progress. Pertinent labs: BMP reveals sodium of 123, potassium of 2.9, chloride of 82, CO2 26, BUN of 39 creatinine of 0.9 with blood sugar of 123.  Magnesium is 1.8, alkaline phosphatase of 87, albumin of 3.5, AST of 26, ALT of 17, total protein of 6.2, total bilirubin of 1.0 and BNP of 931.1.  Troponin is 0.01.  CBC reveals WBC of 5.2, hemoglobin of 12.6, hematocrit of 35.9, MCV of 88.4 with platelet count of 260. EKG: Independently reviewed.  Imaging: independently reviewed.   Review of Systems:  Negative for fever, visual changes, sore throat, rash, new muscle aches, chest pain, SOB, dysuria, bleeding, n/v/abdominal pain.  Past Medical History:  Diagnosis Date  . Cancer (HCC)    Breast  . Hypertension   . LBBB (left bundle branch block)    Patient states that she was told by her  PCP that she has left bundle blockage    Past Surgical History:  Procedure Laterality Date  . APPENDECTOMY    . MASTECTOMY       reports that she has never smoked. She has never used smokeless tobacco. She reports that she drank alcohol. She reports that she does not use drugs.  Allergies  Allergen Reactions  . Chlorthalidone   . Lisinopril Swelling    Eye  And cheek swelling  . Persantine [Dipyridamole]     Family History  Problem Relation Age of Onset  . Cervical cancer Mother   . Hypertension Brother      Prior to Admission medications   Medication Sig Start Date End Date Taking? Authorizing Provider  aspirin EC 81 MG EC tablet Take 1 tablet (81 mg total) by mouth daily. 11/26/17  Yes [name], [name], MD  carvedilol (COREG) 3.125 MG tablet Take 1 tablet (3.125 mg total) by mouth 2 (two) times daily with a meal. 11/25/17  Yes [name], [name], MD  furosemide (LASIX) 40 MG tablet Take 1 tablet (40 mg total) by mouth daily. 11/25/17  Yes [name], [name], MD  losartan (COZAAR) 25 MG tablet Take 1 tablet (25 mg total) by mouth daily. 11/26/17  Yes [name], [name], MD  rosuvastatin (CRESTOR) 5 MG tablet Take 1 tablet (5 mg total) by mouth daily at 6 PM. 11/25/17  Yes Modena Jansky, MD    Physical Exam: Vitals:   12/09/17 1230 12/09/17 1330 12/09/17 1400 12/09/17 1539  BP: 138/63 137/67 135/69 (!) 134/57  Pulse: 75 79 74 84  Resp: 15 18 13 15   Temp:      TempSrc:      SpO2: 99% 98% 96% 96%  Weight:      Height:       Constitutional:  . Appears calm and comfortable Eyes:  . No pallor. No jaundice.  ENMT:  . external ears, nose appear normal . Dry buccal mucosa Neck:  . Neck is supple. No JVD Respiratory:  . CTA bilaterally, no w/r/r.  . Respiratory effort normal. No retractions or accessory muscle use Cardiovascular:  . S1S2 . No LE extremity edema   Abdomen:  . Abdomen is soft and non tender. Organs are difficult to assess. Neurologic:  . Awake and  alert. . Moves all limbs.  Wt Readings from Last 3 Encounters:  12/09/17 64.9 kg  11/25/17 60.4 kg  05/15/12 68 kg    I have personally reviewed following labs and imaging studies  Labs on Admission:  CBC: Recent Labs  Lab 12/09/17 1120  WBC 5.2  HGB 12.6  HCT 35.9*  MCV 88.4  PLT 599   Basic Metabolic Panel: Recent Labs  Lab 12/09/17 1120  NA 123*  K 2.9*  CL 82*  CO2 26  GLUCOSE 123*  BUN 13  CREATININE 0.90  CALCIUM 8.9  MG 1.8   Liver Function Tests: Recent Labs  Lab 12/09/17 1120  AST 26  ALT 17  ALKPHOS 87  BILITOT 1.3*  PROT 6.2*  ALBUMIN 3.5   Recent Labs  Lab 12/09/17 1120  LIPASE 35   No results for input(s): AMMONIA in the last 168 hours. Coagulation Profile: No results for input(s): INR, PROTIME in the last 168 hours. Cardiac Enzymes: No results for input(s): CKTOTAL, CKMB, CKMBINDEX, TROPONINI in the last 168 hours. BNP (last 3 results) No results for input(s): PROBNP in the last 8760 hours. HbA1C: No results for input(s): HGBA1C in the last 72 hours. CBG: Recent Labs  Lab 12/09/17 1215  GLUCAP 109*   Lipid Profile: No results for input(s): CHOL, HDL, LDLCALC, TRIG, CHOLHDL, LDLDIRECT in the last 72 hours. Thyroid Function Tests: No results for input(s): TSH, T4TOTAL, FREET4, T3FREE, THYROIDAB in the last 72 hours. Anemia Panel: No results for input(s): VITAMINB12, FOLATE, FERRITIN, TIBC, IRON, RETICCTPCT in the last 72 hours. Urine analysis:    Component Value Date/Time   COLORURINE YELLOW 12/09/2017 [place] 12/09/2017 1323   LABSPEC 1.005 12/09/2017 1323   PHURINE 6.0 12/09/2017 1323   GLUCOSEU NEGATIVE 12/09/2017 1323   HGBUR NEGATIVE 12/09/2017 1323   BILIRUBINUR NEGATIVE 12/09/2017 1323   KETONESUR 20 (A) 12/09/2017 1323   PROTEINUR NEGATIVE 12/09/2017 1323   NITRITE NEGATIVE 12/09/2017 1323   LEUKOCYTESUR NEGATIVE 12/09/2017 1323   Sepsis Labs: @LABRCNTIP (procalcitonin:4,lacticidven:4) )No  results found for this or any previous visit (from the past 240 hour(s)).    Radiological Exams on Admission: Dg Chest 2 View  Result Date: 12/09/2017 CLINICAL DATA:  84 year old female recently treated for CHF. Nausea and weakness this morning. EXAM: CHEST - 2 VIEW COMPARISON:  Chest radiographs 11/20/2017. FINDINGS: Resolved bilateral pleural effusions and pulmonary interstitial edema since 11/20/2017. Mild pulmonary hyperinflation. Cardiac size at the upper limits of normal. Other mediastinal contours are within normal limits. Visualized tracheal air column is within normal limits. Both lungs appear clear today. No pneumothorax. Chronic lower thoracic and/or upper lumbar augmented compression fractures. Osteopenia. No acute osseous abnormality identified. Negative visible bowel gas pattern. IMPRESSION: Resolved edema and effusions.  No acute cardiopulmonary abnormality. Electronically Signed   By: Genevie Ann M.D.   On: 12/09/2017 13:00    EKG: Independently reviewed.   Active Problems:   Hyponatremia   Assessment/Plan Symptomatic hyponatremia: Likely related to Lasix use and volume depletion. Check urine sodium. Gentle hydration. Check BMP every 8 hours to avoid rapid rise in sodium level. Low threshold for further work-up if hyponatremia does not resolve with gentle hydration.  Hypokalemia: Likely related to diuretic use. Replete.  Weakness and fatigue: Likely secondary to electrolyte abnormality and volume depletion.  Congestive heart failure: Stable from known.  Further management depend on hospital course.  DVT prophylaxis: Subcutaneous Lovenox Code Status: Full code Family Communication: Son and granddaughter Disposition Plan: Home eventually Consults called: None Admission status: Observation  Time spent: 65 minutes minutes  Dana Allan, MD  [facility] Pager #: [phone] 7PM-7AM contact night coverage as above  12/09/2017, 3:52 PM

## 2017-12-09 NOTE — ED Triage Notes (Signed)
Per ems pt has had nausea for 2 weeks. Also has loss of apetite x 2 weeks

## 2017-12-10 ENCOUNTER — Telehealth: Payer: Self-pay | Admitting: Physician Assistant

## 2017-12-10 DIAGNOSIS — E871 Hypo-osmolality and hyponatremia: Secondary | ICD-10-CM | POA: Diagnosis not present

## 2017-12-10 DIAGNOSIS — E876 Hypokalemia: Secondary | ICD-10-CM | POA: Diagnosis not present

## 2017-12-10 LAB — BASIC METABOLIC PANEL
Anion gap: 8 (ref 5–15)
Anion gap: 9 (ref 5–15)
BUN: 9 mg/dL (ref 8–23)
BUN: 8 mg/dL (ref 8–23)
CHLORIDE: 101 mmol/L (ref 98–111)
CO2: 26 mmol/L (ref 22–32)
CO2: 23 mmol/L (ref 22–32)
Calcium: 8.6 mg/dL — ABNORMAL LOW (ref 8.9–10.3)
Calcium: 8.7 mg/dL — ABNORMAL LOW (ref 8.9–10.3)
Chloride: 98 mmol/L (ref 98–111)
Creatinine, Ser: 0.78 mg/dL (ref 0.44–1.00)
Creatinine, Ser: 0.77 mg/dL (ref 0.44–1.00)
GFR calc Af Amer: >60 mL/min (ref >60)
GFR calc Af Amer: >60 mL/min (ref >60)
GFR calc non Af Amer: >60 mL/min (ref >60)
GFR calc non Af Amer: >60 mL/min (ref >60)
GLUCOSE: 91 mg/dL (ref 70–99)
Glucose, Bld: 92 mg/dL (ref 70–99)
POTASSIUM: 5.3 mmol/L — AB (ref 3.5–5.1)
Potassium: 4.6 mmol/L (ref 3.5–5.1)
Sodium: 132 mmol/L — ABNORMAL LOW (ref 135–145)
Sodium: 133 mmol/L — ABNORMAL LOW (ref 135–145)

## 2017-12-10 LAB — CBC
HCT: 32.9 % — ABNORMAL LOW (ref 36.0–46.0)
Hemoglobin: 11.2 g/dL — ABNORMAL LOW (ref 12.0–15.0)
MCH: 30.9 pg (ref 26.0–34.0)
MCHC: 34 g/dL (ref 30.0–36.0)
MCV: 90.6 fL (ref 78.0–100.0)
Platelets: 229 10*3/uL (ref 150–400)
RBC: 3.63 MIL/uL — ABNORMAL LOW (ref 3.87–5.11)
RDW: 12.3 % (ref 11.5–15.5)
WBC: 5.4 10*3/uL (ref 4.0–10.5)

## 2017-12-10 MED ORDER — POTASSIUM CHLORIDE ER 20 MEQ PO TBCR: 20.0000 meq | EXTENDED_RELEASE_TABLET | Freq: Every day | ORAL | 0 refills | Status: AC

## 2017-12-10 MED ORDER — PATIROMER SORBITEX CALCIUM 8.4 G PO PACK
8.4000 g | PACK | Freq: Once | ORAL | Status: AC
  Administered 2017-12-10: 8.4 g via ORAL
  Filled 2017-12-10: qty 1

## 2017-12-10 MED ORDER — FUROSEMIDE 20 MG PO TABS
20.0000 mg | ORAL_TABLET | Freq: Every day | ORAL | Status: DC
  Administered 2017-12-10: 20 mg via ORAL
  Filled 2017-12-10: qty 1

## 2017-12-10 MED ORDER — FUROSEMIDE 40 MG PO TABS: 20.0000 mg | ORAL_TABLET | Freq: Every day | ORAL | 0 refills | Status: DC

## 2017-12-10 NOTE — Discharge Summary (Signed)
PATIENT DETAILS Name: Haley Costa Age: 84 y.o. Sex: female Date of Birth: Dec 26, 1928 MRN: [id number]. Admitting Physician: Bonnell Public, MD OYD:XAJOINO, Hinton Dyer, DO  Admit Date: 12/09/2017 Discharge date: 12/10/2017  Recommendations for Outpatient Follow-up:  1. Follow up with PCP in 1-2 weeks 2. Please obtain BMP/CBC in one week  Admitted From:  Home  Disposition: Home with home health [place]: Yes  Equipment/Devices: None  Discharge Condition: Stable  CODE STATUS: FULL CODE  Diet recommendation:  Heart Healthy   Brief Summary: See H&P, Labs, Consult and Test reports for all details in brief, patient is a 84 year old female who was recently diagnosed with chronic systolic heart failure-placed on diuretics and other antihypertensives-developed fatigue, nausea, poor oral intake due to loss of appetite for a few days prior to this hospital stay-she subsequently presented to the ED where she was found to have a sodium levels of 1.3, and a potassium of 2.9.  She was admitted for further inpatient evaluation and treatment.  See below for further details.  Brief Hospital Course: Mild hyponatremia: Secondary to dehydration in the setting of poor oral intake.  She was gently hydrated with resolution of hyponatremia.  Patient had loss of appetite and nausea for a few days prior to this hospital stay-and on top of that she was very religious about being on fluid restriction of 1 L for her CHF.  On discharge-we will attempt to liberalize her fluid restriction to 1.5 L, decrease her Lasix to 20 mg daily.  She has been asked to follow-up with her PCP or her primary cardiologist within 1 week for repeat blood work.  She is currently euvolemic on exam.  Hypokalemia: Secondary to Lasix-and poor oral intake.  This was repleted.  In fact she is slightly hyperkalemic today.  We will initiate 1 dose of Veltassa-resume her Lasix at half her usual home dose-and also put her on  oral potassium supplementation.  She has been asked to follow-up with her PCP in 1 week for repeat blood work.  Chronic systolic heart failure: This was recently diagnosed during her most recent admission approximately 2 weeks back-she refused invasive work-up including cardiac cath.  She will be continued on on beta-blocker, losartan and Lasix-however Lasix will be decreased to 20 mg daily.  I have reached out to the cardiology service-they will try and schedule her a post hospital discharge visit within 1 week.  She is currently euvolemic and well compensated on exam.  As noted above-we will liberalize her fluid intake-increase fluid restriction to 1.5 L a day.  She has been asked to check her weights daily-if her weight increases by 2 pounds in 2 days, or 5 pounds in a week-she probably needs to take 40 mg of Lasix instead of 20.  She also should touch base with her primary cardiologist.  Weakness/fatigue: Suspect this was secondary to dehydration along with hypokalemia and mild hyponatremia.  Per nursing staff-she is able to ambulate in the room without any assistance.  Rest of the issues were stable during the short overnight hospital stay.  Procedures/Studies: None  Discharge Diagnoses:  Active Problems:   Hyponatremia  Discharge Instructions:  Activity:  As tolerated   Discharge Instructions    (HEART FAILURE PATIENTS) Call MD:  Anytime you have any of the following symptoms: 1) 3 pound weight gain in 24 hours or 5 pounds in 1 week 2) shortness of breath, with or without a dry hacking cough 3) swelling in the hands,  feet or stomach 4) if you have to sleep on extra pillows at night in order to breathe.   Complete by:  As directed    Diet - low sodium heart healthy   Complete by:  As directed    Discharge instructions   Complete by:  As directed    Follow with Primary MD  [name], DO in 1 week  Follow with [facility] heart care as instructed  Fluid restriction of 1.5 Litres per  day   Please get a complete blood count and chemistry panel checked by your Primary MD at your next visit, and again as instructed by your Primary MD.  Get Medicines reviewed and adjusted: Please take all your medications with you for your next visit with your Primary MD  Laboratory/radiological data: Please request your Primary MD to go over all hospital tests and procedure/radiological results at the follow up, please ask your Primary MD to get all Hospital records sent to his/her office.  In some cases, they will be blood work, cultures and biopsy results pending at the time of your discharge. Please request that your primary care M.D. follows up on these results.  Also Note the following: If you experience worsening of your admission symptoms, develop shortness of breath, life threatening emergency, suicidal or homicidal thoughts you must seek medical attention immediately by calling 911 or calling your MD immediately  if symptoms less severe.  You must read complete instructions/literature along with all the possible adverse reactions/side effects for all the Medicines you take and that have been prescribed to you. Take any new Medicines after you have completely understood and accpet all the possible adverse reactions/side effects.   Do not drive when taking Pain medications or sleeping medications (Benzodaizepines)  Do not take more than prescribed Pain, Sleep and Anxiety Medications. It is not advisable to combine anxiety,sleep and pain medications without talking with your primary care practitioner  Special Instructions: If you have smoked or chewed Tobacco  in the last 2 yrs please stop smoking, stop any regular Alcohol  and or any Recreational drug use.  Wear Seat belts while driving.  Please note: You were cared for by a hospitalist during your hospital stay. Once you are discharged, your primary care physician will handle any further medical issues. Please note that NO REFILLS  for any discharge medications will be authorized once you are discharged, as it is imperative that you return to your primary care physician (or establish a relationship with a primary care physician if you do not have one) for your post hospital discharge needs so that they can reassess your need for medications and monitor your lab values.   Increase activity slowly   Complete by:  As directed      Allergies as of 12/10/2017      Reactions   Chlorthalidone    Lisinopril Swelling   Eye  And cheek swelling   Persantine [dipyridamole]       Medication List    TAKE these medications   aspirin 81 MG EC tablet Take 1 tablet (81 mg total) by mouth daily.   carvedilol 3.125 MG tablet Commonly known as:  COREG Take 1 tablet (3.125 mg total) by mouth 2 (two) times daily with a meal.   furosemide 40 MG tablet Commonly known as:  LASIX Take 0.5 tablets (20 mg total) by mouth daily. What changed:  how much to take   losartan 25 MG tablet Commonly known as:  COZAAR Take 1 tablet (  25 mg total) by mouth daily.   Potassium Chloride ER 20 MEQ Tbcr Take 20 mEq by mouth daily.   rosuvastatin 5 MG tablet Commonly known as:  CRESTOR Take 1 tablet (5 mg total) by mouth daily at 6 PM.      Follow-up Information    Almyra Deforest, PA Follow up.   Specialties:  Cardiology, Radiology Why:  [facility] - 10/2 at 10am. Please arrive 15 minutes prior to appt to check in. [name] is one of the PAs that works with the cardiology team. Contact information: [place] [address] [address], [place], [place]. Schedule an appointment as soon as possible for a visit in 1 week(s).   Specialty:  Family Medicine Contact information: [address] [place] [address] [place] [address] [phone]          Allergies  Allergen Reactions  . Chlorthalidone   . Lisinopril Swelling    Eye  And cheek swelling  . Persantine [Dipyridamole]     Consultations:    cardiology  Other Procedures/Studies: Dg Chest 2 View  Result Date: 12/09/2017 CLINICAL DATA:  84 year old female recently treated for CHF. Nausea and weakness this morning. EXAM: CHEST - 2 VIEW COMPARISON:  Chest radiographs 11/20/2017. FINDINGS: Resolved bilateral pleural effusions and pulmonary interstitial edema since 11/20/2017. Mild pulmonary hyperinflation. Cardiac size at the upper limits of normal. Other mediastinal contours are within normal limits. Visualized tracheal air column is within normal limits. Both lungs appear clear today. No pneumothorax. Chronic lower thoracic and/or upper lumbar augmented compression fractures. Osteopenia. No acute osseous abnormality identified. Negative visible bowel gas pattern. IMPRESSION: Resolved edema and effusions.  No acute cardiopulmonary abnormality. Electronically Signed   By: Genevie Ann M.D.   On: 12/09/2017 13:00   Dg Chest 2 View  Result Date: 11/20/2017 CLINICAL DATA:  84 y/o  F; pain. EXAM: CHEST - 2 VIEW COMPARISON:  10/09/2017 thoracic spine radiographs FINDINGS: Moderate left and small right pleural effusions. Diffuse reticular and patchy basilar opacities. Normal cardiac silhouette given projection and technique. Calcific aortic atherosclerosis. Surgical clips project over the left axilla. Compression deformities of the thoracolumbar junction post kyphoplasty. No acute osseous abnormality is evident. IMPRESSION: Interstitial pulmonary edema with moderate left and small right pleural effusions. Basilar opacities probably represent associated atelectasis. Underlying pneumonia is possible. Electronically Signed   By: Kristine Garbe M.D.   On: 11/20/2017 23:54     TODAY-DAY OF DISCHARGE:  Subjective:   Makinzy Br[place] today has no headache,no chest abdominal pain,no new weakness tingling or numbness, feels much better wants to go home today.   Objective:   Blood pressure (!) 152/75, pulse 83, temperature 97.6 F (36.4 C),  temperature source Oral, resp. rate 17, height 5\' 2"  (1.575 m), weight 65.3 kg, SpO2 97 %.  Intake/Output Summary (Last 24 hours) at 12/10/2017 1046 Last data filed at 12/09/2017 2309 Gross per 24 hour  Intake 400 ml  Output 600 ml  Net -200 ml   Filed Weights   12/09/17 1124 12/09/17 1721  Weight: 64.9 kg 65.3 kg    Exam: Awake Alert, Oriented *3, No new F.N deficits, Normal affect [place].AT,PERRAL Supple Neck,No JVD, No cervical lymphadenopathy appriciated.  Symmetrical Chest wall movement, Good air movement bilaterally, CTAB RRR,No Gallops,Rubs or new Murmurs, No Parasternal Heave +ve B.Sounds, Abd Soft, Non tender, No organomegaly appriciated, No rebound -guarding or rigidity. No Cyanosis, Clubbing or edema, No new Rash or bruise  PERTINENT RADIOLOGIC STUDIES: Dg Chest 2 View  Result Date: 12/09/2017 CLINICAL DATA:  84 year old female recently treated for CHF. Nausea and weakness this morning. EXAM: CHEST - 2 VIEW COMPARISON:  Chest radiographs 11/20/2017. FINDINGS: Resolved bilateral pleural effusions and pulmonary interstitial edema since 11/20/2017. Mild pulmonary hyperinflation. Cardiac size at the upper limits of normal. Other mediastinal contours are within normal limits. Visualized tracheal air column is within normal limits. Both lungs appear clear today. No pneumothorax. Chronic lower thoracic and/or upper lumbar augmented compression fractures. Osteopenia. No acute osseous abnormality identified. Negative visible bowel gas pattern. IMPRESSION: Resolved edema and effusions.  No acute cardiopulmonary abnormality. Electronically Signed   By: Genevie Ann M.D.   On: 12/09/2017 13:00   Dg Chest 2 View  Result Date: 11/20/2017 CLINICAL DATA:  84 y/o  F; pain. EXAM: CHEST - 2 VIEW COMPARISON:  10/09/2017 thoracic spine radiographs FINDINGS: Moderate left and small right pleural effusions. Diffuse reticular and patchy basilar opacities. Normal cardiac silhouette given projection and  technique. Calcific aortic atherosclerosis. Surgical clips project over the left axilla. Compression deformities of the thoracolumbar junction post kyphoplasty. No acute osseous abnormality is evident. IMPRESSION: Interstitial pulmonary edema with moderate left and small right pleural effusions. Basilar opacities probably represent associated atelectasis. Underlying pneumonia is possible. Electronically Signed   By: Kristine Garbe M.D.   On: 11/20/2017 23:54     PERTINENT LAB RESULTS: CBC: Recent Labs    12/09/17 1120 12/10/17 0008  WBC 5.2 5.4  HGB 12.6 11.2*  HCT 35.9* 32.9*  PLT 260 229   CMET CMP     Component Value Date/Time   NA 133 (L) 12/10/2017 0706   K 5.3 (H) 12/10/2017 0706   CL 101 12/10/2017 0706   CO2 23 12/10/2017 0706   GLUCOSE 91 12/10/2017 0706   BUN 8 12/10/2017 0706   CREATININE 0.77 12/10/2017 0706   CALCIUM 8.7 (L) 12/10/2017 0706   PROT 6.2 (L) 12/09/2017 1120   ALBUMIN 3.5 12/09/2017 1120   AST 26 12/09/2017 1120   ALT 17 12/09/2017 1120   ALKPHOS 87 12/09/2017 1120   BILITOT 1.3 (H) 12/09/2017 1120   GFRNONAA >60 12/10/2017 0706   GFRAA >60 12/10/2017 0706    GFR Estimated Creatinine Clearance: 44.8 mL/min (by C-G formula based on SCr of 0.77 mg/dL). Recent Labs    12/09/17 1120  LIPASE 35   No results for input(s): CKTOTAL, CKMB, CKMBINDEX, TROPONINI in the last 72 hours. Invalid input(s): POCBNP No results for input(s): DDIMER in the last 72 hours. No results for input(s): HGBA1C in the last 72 hours. No results for input(s): CHOL, HDL, LDLCALC, TRIG, CHOLHDL, LDLDIRECT in the last 72 hours. Recent Labs    12/09/17 1655  TSH 1.091   No results for input(s): VITAMINB12, FOLATE, FERRITIN, TIBC, IRON, RETICCTPCT in the last 72 hours. Coags: No results for input(s): INR in the last 72 hours.  Invalid input(s): PT Microbiology: No results found for this or any previous visit (from the past 240 hour(s)).  FURTHER DISCHARGE  INSTRUCTIONS:  Get Medicines reviewed and adjusted: Please take all your medications with you for your next visit with your Primary MD  Laboratory/radiological data: Please request your Primary MD to go over all hospital tests and procedure/radiological results at the follow up, please ask your Primary MD to get all Hospital records sent to his/her office.  In some cases, they will be blood work, cultures and biopsy results pending at the time of your discharge. Please request  that your primary care M.D. goes through all the records of your hospital data and follows up on these results.  Also Note the following: If you experience worsening of your admission symptoms, develop shortness of breath, life threatening emergency, suicidal or homicidal thoughts you must seek medical attention immediately by calling 911 or calling your MD immediately  if symptoms less severe.  You must read complete instructions/literature along with all the possible adverse reactions/side effects for all the Medicines you take and that have been prescribed to you. Take any new Medicines after you have completely understood and accpet all the possible adverse reactions/side effects.   Do not drive when taking Pain medications or sleeping medications (Benzodaizepines)  Do not take more than prescribed Pain, Sleep and Anxiety Medications. It is not advisable to combine anxiety,sleep and pain medications without talking with your primary care practitioner  Special Instructions: If you have smoked or chewed Tobacco  in the last 2 yrs please stop smoking, stop any regular Alcohol  and or any Recreational drug use.  Wear Seat belts while driving.  Please note: You were cared for by a hospitalist during your hospital stay. Once you are discharged, your primary care physician will handle any further medical issues. Please note that NO REFILLS for any discharge medications will be authorized once you are discharged, as it is  imperative that you return to your primary care physician (or establish a relationship with a primary care physician if you do not have one) for your post hospital discharge needs so that they can reassess your need for medications and monitor your lab values.  Total Time spent coordinating discharge including counseling, education and face to face time equals 25  minutes.  SignedOren Binet 12/10/2017 10:46 AM

## 2017-12-10 NOTE — Telephone Encounter (Signed)
New message    TOC  Appt per Lisbeth Renshaw Dunn 7-10 days with Almyra Deforest on 12/17/17 at 10:00 am.

## 2017-12-10 NOTE — Care Management Note (Signed)
Case Management Note  Patient Details  Name: Haley Costa MRN: [id number] Date of Birth: [birth year]  Subjective/Objective:         Presents with progressively worsening nausea intermittently for 2 weeks, hx of  breast cancer, hypertension, left bundle branch block, CHF. Recently admitted for CHF exacerbation on 11/20/2017 and discharged on 11/25/2017.  From home with son. States independent with ADL's, no dme usage PTA.        Coralie Keens (Granddaughter) Zoiey Christy (Son)    [phone]      PCP: [name]  Action/Plan: Transition to home with home health services to follow. States son or granddaughter to provide transportation to home.  Expected Discharge Date:  12/10/17               Expected Discharge Plan:  [place]  In-House Referral:     Discharge planning Services  CM Consult  Post Acute Care Choice:    Choice offered to:  Patient  DME Arranged:  N/A DME Agency:  NA  HH Arranged:  RN [place] Agency:  [place]  Status of Service:  Completed, signed off  If discussed at [place] of Stay Meetings, dates discussed:    Additional Comments:  Sharin Mons, RN 12/10/2017, 11:23 AM

## 2017-12-10 NOTE — Evaluation (Signed)
Physical Therapy Evaluation Patient Details Name: Haley Costa MRN: [id number] DOB: May 04, 1928 Today's Date: 12/10/2017   History of Present Illness  Pt is a 84 y.o. F with significant PMH of HTN, left bundle branch block, recent diagnosis of CHF. Admitted with weakness, fatigue, nausea, poor p.o. intake, loss of appetite. Found to have hyponatremia and hypokalemia.   Clinical Impression  Patient evaluated by Physical Therapy with no further acute PT needs identified. Ambulating hallway distances with no assistive device independently. All education has been completed and the patient has no further questions. No follow-up Physical Therapy or equipment needs. PT is signing off. Thank you for this referral.     Follow Up Recommendations No PT follow up    Equipment Recommendations  None recommended by PT    Recommendations for Other Services       Precautions / Restrictions Precautions Precautions: None Restrictions Weight Bearing Restrictions: No      Mobility  Bed Mobility Overal bed mobility: Independent                Transfers Overall transfer level: Independent Equipment used: None                Ambulation/Gait Ambulation/Gait assistance: Modified independent (Device/Increase time)((slightly increased time)) Gait Distance (Feet): 200 Feet Assistive device: None Gait Pattern/deviations: Step-through pattern   Gait velocity interpretation: 1.31 - 2.62 ft/sec, indicative of limited community ambulator General Gait Details: Pt with slightly slower cadence but overall no evidence of gross unsteadiness  Stairs            Wheelchair Mobility    Modified Rankin (Stroke Patients Only)       Balance Overall balance assessment: Modified Independent                                           Pertinent Vitals/Pain Pain Assessment: No/denies pain    Home Living Family/patient expects to be discharged to:: Private  residence Living Arrangements: Children Available Help at Discharge: Family;Available 24 hours/day Type of Home: House Home Access: Stairs to enter   [employer] of Steps: 3 Home Layout: One level Home Equipment: Walker - 2 wheels;Cane - quad      Prior Function Level of Independence: Independent               Hand Dominance   Dominant Hand: Right    Extremity/Trunk Assessment   Upper Extremity Assessment Upper Extremity Assessment: Overall WFL for tasks assessed    Lower Extremity Assessment Lower Extremity Assessment: Overall WFL for tasks assessed    Cervical / Trunk Assessment Cervical / Trunk Assessment: Kyphotic  Communication   Communication: No difficulties  Cognition Arousal/Alertness: Awake/alert Behavior During Therapy: WFL for tasks assessed/performed Overall Cognitive Status: Within Functional Limits for tasks assessed                                        General Comments  VSS    Exercises     Assessment/Plan    PT Assessment [place] does not need any further PT services  PT Problem List Decreased strength;Cardiopulmonary status limiting activity       PT Treatment Interventions      PT Goals (Current goals can be found in the Care Plan section)  Acute Rehab PT  Goals Patient Stated Goal: return to being independent PT Goal Formulation: All assessment and education complete, DC therapy    Frequency     Barriers to discharge        Co-evaluation               AM-PAC PT "6 Clicks" Daily Activity  Outcome Measure Difficulty turning over in bed (including adjusting bedclothes, sheets and blankets)?: None Difficulty moving from lying on back to sitting on the side of the bed? : None Difficulty sitting down on and standing up from a chair with arms (e.g., wheelchair, bedside commode, etc,.)?: None Help needed moving to and from a bed to chair (including a wheelchair)?: None Help needed walking in  hospital room?: None Help needed climbing 3-5 steps with a railing? : None 6 Click Score: 24    End of Session Equipment Utilized During Treatment: Gait belt Activity Tolerance: Patient tolerated treatment well Patient left: in chair;with call bell/phone within reach Nurse Communication: Mobility status;Other (comment)((indep for room mobility)) PT Visit Diagnosis: Unsteadiness on feet (R26.81)    Time: 8590-9311 PT Time Calculation (min) (ACUTE ONLY): 11 min   Charges:   PT Evaluation $PT Eval Low Complexity: 1 Low          Ellamae Sia, PT, DPT Acute Rehabilitation Services Pager [phone] Office [phone]   Willy Eddy 12/10/2017, 10:51 AM

## 2017-12-10 NOTE — Progress Notes (Signed)
Haley Costa to be D/C'd Home per MD order.  Discussed with the patient and all questions fully answered.  VSS, Skin clean, dry and intact without evidence of skin break down, no evidence of skin tears noted. IV catheter discontinued intact. Site without signs and symptoms of complications. Dressing and pressure applied.  An After Visit Summary was printed and given to the patient. Patient received prescription.  D/c education completed with patient/family including follow up instructions, medication list, d/c activities limitations if indicated, with other d/c instructions as indicated by MD - patient able to verbalize understanding, all questions fully answered.   Patient instructed to return to ED, call 911, or call MD for any changes in condition.   Patient escorted via [place], and D/C home via private auto.  [name] 12/10/2017 1:41 PM

## 2017-12-11 ENCOUNTER — Encounter (INDEPENDENT_AMBULATORY_CARE_PROVIDER_SITE_OTHER): Payer: Medicare Other | Admitting: Ophthalmology

## 2017-12-11 NOTE — Telephone Encounter (Signed)
Patient contacted regarding discharge from [facility] on 12/10/17.  Patient understands to follow up with provider Almyra Deforest, PA on 12/17/17 at 10:00 am at [facility] Patient understands discharge instructions? Yes Patient understands medications and regiment? Yes Patient understands to bring all medications to this visit? Yes

## 2017-12-15 DIAGNOSIS — I10 Essential (primary) hypertension: Secondary | ICD-10-CM | POA: Diagnosis not present

## 2017-12-15 DIAGNOSIS — E878 Other disorders of electrolyte and fluid balance, not elsewhere classified: Secondary | ICD-10-CM | POA: Diagnosis not present

## 2017-12-15 DIAGNOSIS — I5022 Chronic systolic (congestive) heart failure: Secondary | ICD-10-CM | POA: Diagnosis not present

## 2017-12-16 DIAGNOSIS — I110 Hypertensive heart disease with heart failure: Secondary | ICD-10-CM | POA: Diagnosis not present

## 2017-12-16 DIAGNOSIS — I5022 Chronic systolic (congestive) heart failure: Secondary | ICD-10-CM | POA: Diagnosis not present

## 2017-12-16 DIAGNOSIS — Z853 Personal history of malignant neoplasm of breast: Secondary | ICD-10-CM | POA: Diagnosis not present

## 2017-12-16 DIAGNOSIS — I447 Left bundle-branch block, unspecified: Secondary | ICD-10-CM | POA: Diagnosis not present

## 2017-12-17 ENCOUNTER — Encounter: Payer: Self-pay | Admitting: Physician Assistant

## 2017-12-17 ENCOUNTER — Ambulatory Visit (INDEPENDENT_AMBULATORY_CARE_PROVIDER_SITE_OTHER): Payer: Medicare Other | Admitting: Physician Assistant

## 2017-12-17 VITALS — BP 151/88 | HR 92 | Ht 62.0 in | Wt 142.0 lb

## 2017-12-17 DIAGNOSIS — E876 Hypokalemia: Secondary | ICD-10-CM | POA: Diagnosis not present

## 2017-12-17 DIAGNOSIS — E785 Hyperlipidemia, unspecified: Secondary | ICD-10-CM | POA: Diagnosis not present

## 2017-12-17 DIAGNOSIS — R55 Syncope and collapse: Secondary | ICD-10-CM | POA: Diagnosis not present

## 2017-12-17 DIAGNOSIS — I10 Essential (primary) hypertension: Secondary | ICD-10-CM | POA: Diagnosis not present

## 2017-12-17 DIAGNOSIS — I5022 Chronic systolic (congestive) heart failure: Secondary | ICD-10-CM

## 2017-12-17 DIAGNOSIS — I447 Left bundle-branch block, unspecified: Secondary | ICD-10-CM | POA: Diagnosis not present

## 2017-12-17 MED ORDER — CARVEDILOL 6.25 MG PO TABS: 3.1250 mg | ORAL_TABLET | Freq: Two times a day (BID) | ORAL | 3 refills | Status: DC

## 2017-12-17 NOTE — Progress Notes (Signed)
Cardiology Office Note    Date:  12/19/2017   ID:  Haley Costa, Haley Costa [birth year], MRN [id number]  PCP:  [name], DO  Cardiologist:  Dr. Stanford Breed   Chief Complaint  Patient presents with  . Follow-up    pt denied chest pain    History of Present Illness:  Haley Costa is a 84 y.o. female with PMH of HTN, HLD, LBBB, and breast CA. patient was recently seen in the hospital in early September 2019 for chest pain.  EKG showed left bundle branch block with LVH.  She complained of 2-week worsening shortness of breath as well.  She was found to have borderline elevated troponin, and low ejection fraction of 20 to 25% on echocardiogram with moderate MR.  Dr. Stanford Breed discussed with the patient regarding risk and benefit of cardiac catheterization, she elected conservative measures for the time being.  Stress test was not performed since patient declined cath.  She was placed on low-dose carvedilol and losartan.  She was initially placed on Entresto and spironolactone, however had a significant hypotension and had a syncopal episode.  Both medications stopped.  She was also diuresed with Lasix and eventually discharged on 40 mg daily of Lasix.  She was readmitted on 12/09/2017 with fatigue, nausea and poor oral intake.  She had a potassium of 2.9.  She was treated with a single dose of Veltessa.  Lasix was reduced to 20 mg daily.  She has been asked to keep track of her weight and keep a fluid restriction of 1.5 L/day.  Patient presents today for cardiology office visit.  Her weight is fairly stable on today's exam.  Blood pressure is elevated, her blood pressure seems to be in the 140s at home as well.  I will increase carvedilol to 3.125 mg twice daily.  Otherwise she has no significant lower extremity edema, orthopnea or PND.  She denies any recent chest pain.  I will bring the patient back in 2 to 3 weeks for further up titration of beta-blocker and ARB.  Once her up titration of  medication is complete, I would recommend a repeat echocardiogram in 3 months to reassess the ejection fraction.  Otherwise, she says her primary care provider has already obtain basic metabolic panel on Monday.  We will request the record.   Past Medical History:  Diagnosis Date  . Breast cancer, left breast ([place]) 1992   S/P mastectomy & chemo  . CHF (congestive heart failure) ([place])   . Chronic back pain    "lower and middle back; I've had several broken vertebrae" (12/09/2017)  . Heart murmur   . High cholesterol   . History of kidney stones   . Hypertension   . LBBB (left bundle branch block)    Patient states that she was told by her PCP that she has left bundle blockage  . Osteoarthritis    "some of the joints" (12/09/2017)  . Pneumonia    "when I was a child" (12/09/2017)  . PONV (postoperative nausea and vomiting)     Past Surgical History:  Procedure Laterality Date  . APPENDECTOMY    . BACK SURGERY    . CATARACT EXTRACTION W/ INTRAOCULAR LENS IMPLANT Right   . FIXATION KYPHOPLASTY THORACIC SPINE  2014  . MASTECTOMY Left 1992    Current Medications: Outpatient Medications Prior to Visit  Medication Sig Dispense Refill  . aspirin EC 81 MG EC tablet Take 1 tablet (81 mg total) by mouth daily. Brooten  tablet 0  . furosemide (LASIX) 40 MG tablet Take 0.5 tablets (20 mg total) by mouth daily. 30 tablet 0  . losartan (COZAAR) 25 MG tablet Take 1 tablet (25 mg total) by mouth daily. 30 tablet 0  . potassium chloride 20 MEQ TBCR Take 20 mEq by mouth daily. 30 tablet 0  . rosuvastatin (CRESTOR) 5 MG tablet Take 1 tablet (5 mg total) by mouth daily at 6 PM. 30 tablet 0  . carvedilol (COREG) 3.125 MG tablet Take 1 tablet (3.125 mg total) by mouth 2 (two) times daily with a meal. 60 tablet 0   No facility-administered medications prior to visit.      Allergies:   Chlorthalidone; Lisinopril; and Persantine [dipyridamole]   Social History   Socioeconomic History  . Marital status:  Widowed    Spouse name: Not on file  . Number of children: Not on file  . Years of education: Not on file  . Highest education level: Not on file  Occupational History  . Not on file  Social Needs  . Financial resource strain: Not on file  . Food insecurity:    Worry: Not on file    Inability: Not on file  . Transportation needs:    Medical: Not on file    Non-medical: Not on file  Tobacco Use  . Smoking status: Never Smoker  . Smokeless tobacco: Never Used  Substance and Sexual Activity  . Alcohol use: Never    Frequency: Never  . Drug use: Never  . Sexual activity: Not on file  Lifestyle  . Physical activity:    Days per week: Not on file    Minutes per session: Not on file  . Stress: Not on file  Relationships  . Social connections:    Talks on phone: Not on file    Gets together: Not on file    Attends religious service: Not on file    Active member of club or organization: Not on file    Attends meetings of clubs or organizations: Not on file    Relationship status: Not on file  Other Topics Concern  . Not on file  Social History Narrative  . Not on file     Family History:  The patient's family history includes Cervical cancer in her mother; Hypertension in her brother.   ROS:   Please see the history of present illness.    ROS All other systems reviewed and are negative.   PHYSICAL EXAM:   VS:  BP (!) 151/88   Pulse 92   Ht 5\' 2"  (1.575 m)   Wt 142 lb (64.4 kg)   BMI 25.97 kg/m    GEN: Well nourished, well developed, in no acute distress  HEENT: normal  Neck: no JVD, carotid bruits, or masses Cardiac: RRR; no murmurs, rubs, or gallops,no edema  Respiratory:  clear to auscultation bilaterally, normal work of breathing GI: soft, nontender, nondistended, + BS MS: no deformity or atrophy  Skin: warm and dry, no rash Neuro:  Alert and Oriented x 3, Strength and sensation are intact Psych: euthymic mood, full affect  Wt Readings from Last 3  Encounters:  12/17/17 142 lb (64.4 kg)  12/09/17 143 lb 15.4 oz (65.3 kg)  11/25/17 133 lb 1.6 oz (60.4 kg)      Studies/Labs Reviewed:   EKG:  EKG is not ordered today.    Recent Labs: 12/09/2017: ALT 17; B Natriuretic Peptide 931.1; Magnesium 2.6; TSH 1.091 12/10/2017: BUN 8; Creatinine,  Ser 0.77; Hemoglobin 11.2; Platelets 229; Potassium 5.3; Sodium 133   Lipid Panel    Component Value Date/Time   CHOL 205 (H) 11/21/2017 0438   TRIG 52 11/21/2017 0438   HDL 53 11/21/2017 0438   CHOLHDL 3.9 11/21/2017 0438   VLDL 10 11/21/2017 0438   LDLCALC 142 (H) 11/21/2017 0438    Additional studies/ records that were reviewed today include:   Echo 11/21/2017 LV EF: 25% -   30% Study Conclusions  - Left ventricle: The cavity size was normal. Wall thickness was   normal. Akinesis of the basal to mid inferior and inferoseptal   walls. The remaining walls were hypokinetic. Systolic function   was severely reduced. The estimated ejection fraction was in the   range of 25% to 30%. Doppler parameters are consistent with   abnormal left ventricular relaxation (grade 1 diastolic   dysfunction). - Aortic valve: There was no stenosis. - Mitral valve: Mildly calcified annulus. There was moderate   regurgitation. - Left atrium: The atrium was moderately dilated. - Right ventricle: The cavity size was normal. Systolic function   was normal. - Tricuspid valve: Peak RV-RA gradient (S): 34 mm Hg. - Pulmonary arteries: PA peak pressure: 37 mm Hg (S). - Inferior vena cava: The vessel was normal in size. The   respirophasic diameter changes were in the normal range (= 50%),   consistent with normal central venous pressure. - Pericardium, extracardiac: There was a pleural effusion.  Impressions:  - Normal LV size with EF 25-30%. Akinesis of the basal to mid   inferior and inferoseptal walls, hypokinesis of the remainder of   the left ventricle. Normal RV size and systolic function.    Moderate mitral regurgitation. Mild pulmonary hypertension.    ASSESSMENT:    1. Chronic systolic heart failure ([place])   2. Essential hypertension   3. Hyperlipidemia, unspecified hyperlipidemia type   4. Complete left bundle branch block (LBBB)   5. Syncope, unspecified syncope type   6. Hypokalemia      PLAN:  In order of problems listed above:  1. Chronic systolic heart failure: Patient refused cardiac catheterization, she denies any recent chest pain.  Echocardiogram showed EF of 25 to 30%.  Increase carvedilol to 6.25 mg twice daily.  I will bring the patient back in 2 to 3 weeks for reassessment and up titration of medication.  2. Syncope: Occurred during hospitalization after she was placed on both spironolactone and Entresto.  This resulted in significant hypotension and syncopal episode.  She has not had any further issue.  She is currently off of spironolactone and Entresto.   3. Hypertension: Blood pressure elevated, increase carvedilol to 6.25 mg twice daily.  4. Hyperlipidemia: on Crestor 5 mg daily.  Recent LDL was 142.  I will discussed with the patient again about increasing Crestor on follow-up.  She is hesitant to change too many medication all at once.  5. Hypokalemia: She apparently already had lab work at PCPs office on Monday.  We will request the record.    Medication Adjustments/Labs and Tests Ordered: Current medicines are reviewed at length with the patient today.  Concerns regarding medicines are outlined above.  Medication changes, Labs and Tests ordered today are listed in the Patient Instructions below. Patient Instructions  Almyra Deforest, PA has recommended making the following medication changes: 1. INCREASE Carvedilol to 6.25 mg twice daily  Your physician recommends that you schedule a follow-up appointment in 2-3 weeks with [name].  [name] recommends  that you schedule a follow-up appointment in 3 months with Dr Stanford Breed.  If you need a refill on your  cardiac medications before your next appointment, please call your pharmacy.    Hilbert Corrigan, [place]  12/19/2017 10:56 PM    [facility] [address], [place], [place]  [address] Phone: [phone]; Fax: [phone]

## 2017-12-17 NOTE — Patient Instructions (Signed)
Haley Costa, Haley Costa has recommended making the following medication changes: 1. INCREASE Carvedilol to 6.25 mg twice daily  Your physician recommends that you schedule a follow-up appointment in 2-3 weeks with [name].  [name] recommends that you schedule a follow-up appointment in 3 months with Dr Stanford Breed.  If you need a refill on your cardiac medications before your next appointment, please call your pharmacy.

## 2017-12-19 ENCOUNTER — Encounter: Payer: Self-pay | Admitting: Physician Assistant

## 2017-12-25 DIAGNOSIS — Z853 Personal history of malignant neoplasm of breast: Secondary | ICD-10-CM | POA: Diagnosis not present

## 2017-12-25 DIAGNOSIS — I5022 Chronic systolic (congestive) heart failure: Secondary | ICD-10-CM | POA: Diagnosis not present

## 2017-12-25 DIAGNOSIS — I447 Left bundle-branch block, unspecified: Secondary | ICD-10-CM | POA: Diagnosis not present

## 2017-12-25 DIAGNOSIS — I110 Hypertensive heart disease with heart failure: Secondary | ICD-10-CM | POA: Diagnosis not present

## 2017-12-31 DIAGNOSIS — I110 Hypertensive heart disease with heart failure: Secondary | ICD-10-CM | POA: Diagnosis not present

## 2017-12-31 DIAGNOSIS — I5022 Chronic systolic (congestive) heart failure: Secondary | ICD-10-CM | POA: Diagnosis not present

## 2017-12-31 DIAGNOSIS — I447 Left bundle-branch block, unspecified: Secondary | ICD-10-CM | POA: Diagnosis not present

## 2017-12-31 DIAGNOSIS — Z853 Personal history of malignant neoplasm of breast: Secondary | ICD-10-CM | POA: Diagnosis not present

## 2018-01-09 ENCOUNTER — Ambulatory Visit (INDEPENDENT_AMBULATORY_CARE_PROVIDER_SITE_OTHER): Payer: Medicare Other | Admitting: Physician Assistant

## 2018-01-09 ENCOUNTER — Encounter: Payer: Self-pay | Admitting: Physician Assistant

## 2018-01-09 VITALS — BP 104/64 | HR 89 | Ht 62.0 in | Wt 141.0 lb

## 2018-01-09 DIAGNOSIS — E871 Hypo-osmolality and hyponatremia: Secondary | ICD-10-CM

## 2018-01-09 DIAGNOSIS — E785 Hyperlipidemia, unspecified: Secondary | ICD-10-CM | POA: Diagnosis not present

## 2018-01-09 DIAGNOSIS — I5022 Chronic systolic (congestive) heart failure: Secondary | ICD-10-CM | POA: Diagnosis not present

## 2018-01-09 DIAGNOSIS — I447 Left bundle-branch block, unspecified: Secondary | ICD-10-CM | POA: Diagnosis not present

## 2018-01-09 DIAGNOSIS — I10 Essential (primary) hypertension: Secondary | ICD-10-CM

## 2018-01-09 DIAGNOSIS — I5021 Acute systolic (congestive) heart failure: Secondary | ICD-10-CM | POA: Diagnosis not present

## 2018-01-09 LAB — BASIC METABOLIC PANEL
BUN/Creatinine Ratio: 13 (ref 12–28)
BUN: 12 mg/dL (ref 8–27)
CALCIUM: 9.3 mg/dL (ref 8.7–10.3)
CHLORIDE: 96 mmol/L (ref 96–106)
CO2: 22 mmol/L (ref 20–29)
CREATININE: 0.89 mg/dL (ref 0.57–1.00)
GFR calc Af Amer: 68 mL/min/{1.73_m2} (ref >59)
GFR calc non Af Amer: 59 mL/min/{1.73_m2} — ABNORMAL LOW (ref >59)
GLUCOSE: 92 mg/dL (ref 65–99)
Potassium: 4.9 mmol/L (ref 3.5–5.2)
Sodium: 132 mmol/L — ABNORMAL LOW (ref 134–144)

## 2018-01-09 NOTE — Patient Instructions (Signed)
Your physician recommends that you continue on your current medications as directed. Please refer to the Current Medication list given to you today.  Your physician recommends that you return for lab work in: TODAY BMET   Your physician has requested that you have an echocardiogram. Echocardiography is a painless test that uses sound waves to create images of your heart. It provides your doctor with information about the size and shape of your heart and how well your heart's chambers and valves are working. This procedure takes approximately one hour. There are no restrictions for this procedure.  DUE April 01 2018   Your physician recommends that you schedule a follow-up appointment in:  AS SCHEDULED

## 2018-01-09 NOTE — Progress Notes (Signed)
Cardiology Office Note    Date:  01/09/2018   ID:  Haley Costa, Haley Costa [birth year], MRN [id number]  PCP:  [name], DO  Cardiologist:  Dr. Stanford Breed   Chief Complaint  Patient presents with  . Follow-up    seen for Dr. Stanford Breed.     History of Present Illness:  Haley Costa is a 84 y.o. female with PMH of HTN, HLD, LBBB, and breast CA. patient was recently seen in the hospital in early September 2019 for chest pain.  EKG showed left bundle branch block with LVH.  She complained of 2-week worsening shortness of breath as well.  She was found to have borderline elevated troponin, and low ejection fraction of 25% on echocardiogram with moderate MR.  Dr. Stanford Breed discussed with the patient regarding risk and benefit of cardiac catheterization, she elected conservative measures for the time being.  Stress test was not performed since patient declined cath.  She was placed on low-dose carvedilol and losartan.  She was initially placed on Entresto and spironolactone, however had a significant hypotension and a syncopal episode.  Both medications stopped.  She was also diuresed with Lasix and eventually discharged on 40 mg daily of Lasix.  She was readmitted on 12/09/2017 with fatigue, nausea and poor oral intake.  She had a potassium of 2.9.  She was treated with a single dose of Veltessa.  Lasix was reduced to 20 mg daily.  She has been asked to keep track of her weight and keep a fluid restriction of 1.5 L/day.  I last saw the patient on 12/17/2017, her weight was stable at the time.  Blood pressure was elevated.  I increased carvedilol to 6.25 mg twice daily.  Patient presents back today for up titration of beta-blocker and ARB.  She says after the carvedilol was increased to the last office visit, she did feel bad for several days, however now she is tolerating the new dose of the medication.  She denies any significant dizziness.  Her blood pressure at home has been ranging in the 100  110s.  I am unable to uptitrate her heart failure medication any further.  I plan to obtain a 63-month repeat echocardiogram prior to her next office visit with Dr. Stanford Breed in January.  Otherwise she does not have any lower extremity edema, orthopnea or PND.  I will obtain a basic metabolic panel today to make sure her renal function is stable.  Past Medical History:  Diagnosis Date  . Breast cancer, left breast ([place]) 1992   S/P mastectomy & chemo  . CHF (congestive heart failure) ([place])   . Chronic back pain    "lower and middle back; I've had several broken vertebrae" (12/09/2017)  . Heart murmur   . High cholesterol   . History of kidney stones   . Hypertension   . LBBB (left bundle branch block)    Patient states that she was told by her PCP that she has left bundle blockage  . Osteoarthritis    "some of the joints" (12/09/2017)  . Pneumonia    "when I was a child" (12/09/2017)  . PONV (postoperative nausea and vomiting)     Past Surgical History:  Procedure Laterality Date  . APPENDECTOMY    . BACK SURGERY    . CATARACT EXTRACTION W/ INTRAOCULAR LENS IMPLANT Right   . FIXATION KYPHOPLASTY THORACIC SPINE  2014  . MASTECTOMY Left 1992    Current Medications: Outpatient Medications Prior to Visit  Medication  Sig Dispense Refill  . aspirin EC 81 MG EC tablet Take 1 tablet (81 mg total) by mouth daily. 30 tablet 0  . carvedilol (COREG) 6.25 MG tablet Take 6.25 mg by mouth 2 (two) times daily with a meal.    . furosemide (LASIX) 40 MG tablet Take 0.5 tablets (20 mg total) by mouth daily. 30 tablet 0  . losartan (COZAAR) 25 MG tablet Take 1 tablet (25 mg total) by mouth daily. 30 tablet 0  . potassium chloride 20 MEQ TBCR Take 20 mEq by mouth daily. 30 tablet 0  . rosuvastatin (CRESTOR) 5 MG tablet Take 1 tablet (5 mg total) by mouth daily at 6 PM. 30 tablet 0  . carvedilol (COREG) 6.25 MG tablet Take 0.5 tablets (3.125 mg total) by mouth 2 (two) times daily with a meal. (Patient  not taking: Reported on 01/09/2018) 180 tablet 3   No facility-administered medications prior to visit.      Allergies:   Chlorthalidone; Lisinopril; and Persantine [dipyridamole]   Social History   Socioeconomic History  . Marital status: Widowed    Spouse name: Not on file  . Number of children: Not on file  . Years of education: Not on file  . Highest education level: Not on file  Occupational History  . Not on file  Social Needs  . Financial resource strain: Not on file  . Food insecurity:    Worry: Not on file    Inability: Not on file  . Transportation needs:    Medical: Not on file    Non-medical: Not on file  Tobacco Use  . Smoking status: Never Smoker  . Smokeless tobacco: Never Used  Substance and Sexual Activity  . Alcohol use: Never    Frequency: Never  . Drug use: Never  . Sexual activity: Not on file  Lifestyle  . Physical activity:    Days per week: Not on file    Minutes per session: Not on file  . Stress: Not on file  Relationships  . Social connections:    Talks on phone: Not on file    Gets together: Not on file    Attends religious service: Not on file    Active member of club or organization: Not on file    Attends meetings of clubs or organizations: Not on file    Relationship status: Not on file  Other Topics Concern  . Not on file  Social History Narrative  . Not on file     Family History:  The patient's family history includes Cervical cancer in her mother; Hypertension in her brother.   ROS:   Please see the history of present illness.    ROS All other systems reviewed and are negative.   PHYSICAL EXAM:   VS:  BP 104/64   Pulse 89   Ht 5\' 2"  (1.575 m)   Wt 141 lb (64 kg)   SpO2 98%   BMI 25.79 kg/m    GEN: Well nourished, well developed, in no acute distress  HEENT: normal  Neck: no JVD, carotid bruits, or masses Cardiac: RRR; no murmurs, rubs, or gallops,no edema  Respiratory:  clear to auscultation bilaterally, normal  work of breathing GI: soft, nontender, nondistended, + BS MS: no deformity or atrophy  Skin: warm and dry, no rash Neuro:  Alert and Oriented x 3, Strength and sensation are intact Psych: euthymic mood, full affect  Wt Readings from Last 3 Encounters:  01/09/18 141 lb (64 kg)  12/17/17 142 lb (64.4 kg)  12/09/17 143 lb 15.4 oz (65.3 kg)      Studies/Labs Reviewed:   EKG:  EKG is not ordered today.    Recent Labs: 12/09/2017: ALT 17; B Natriuretic Peptide 931.1; Magnesium 2.6; TSH 1.091 12/10/2017: BUN 8; Creatinine, Ser 0.77; Hemoglobin 11.2; Platelets 229; Potassium 5.3; Sodium 133   Lipid Panel    Component Value Date/Time   CHOL 205 (H) 11/21/2017 0438   TRIG 52 11/21/2017 0438   HDL 53 11/21/2017 0438   CHOLHDL 3.9 11/21/2017 0438   VLDL 10 11/21/2017 0438   LDLCALC 142 (H) 11/21/2017 0438    Additional studies/ records that were reviewed today include:   Echo 11/21/2017 LV EF: 25% -   30% Study Conclusions  - Left ventricle: The cavity size was normal. Wall thickness was   normal. Akinesis of the basal to mid inferior and inferoseptal   walls. The remaining walls were hypokinetic. Systolic function   was severely reduced. The estimated ejection fraction was in the   range of 25% to 30%. Doppler parameters are consistent with   abnormal left ventricular relaxation (grade 1 diastolic   dysfunction). - Aortic valve: There was no stenosis. - Mitral valve: Mildly calcified annulus. There was moderate   regurgitation. - Left atrium: The atrium was moderately dilated. - Right ventricle: The cavity size was normal. Systolic function   was normal. - Tricuspid valve: Peak RV-RA gradient (S): 34 mm Hg. - Pulmonary arteries: PA peak pressure: 37 mm Hg (S). - Inferior vena cava: The vessel was normal in size. The   respirophasic diameter changes were in the normal range (= 50%),   consistent with normal central venous pressure. - Pericardium, extracardiac: There was a  pleural effusion.  Impressions:  - Normal LV size with EF 25-30%. Akinesis of the basal to mid   inferior and inferoseptal walls, hypokinesis of the remainder of   the left ventricle. Normal RV size and systolic function.   Moderate mitral regurgitation. Mild pulmonary hypertension    ASSESSMENT:    1. Chronic systolic congestive heart failure ([place])   2. Hyponatremia   3. Essential hypertension   4. Hyperlipidemia, unspecified hyperlipidemia type   5. LBBB (left bundle branch block)      PLAN:  In order of problems listed above:  1. Chronic systolic heart failure: EF 25% on echocardiogram.  She did not wish to pursue any invasive work-up.  Patient is euvolemic on today's physical exam.  Continue on carvedilol and losartan.  Repeat echocardiogram in January prior to her next office visit with Dr. Stanford Breed.  Obtain basic metabolic panel to follow-up on electrolyte.  2. Hypertension: Blood pressure borderline after carvedilol was increased.  Tolerating the current medication  3. Hyperlipidemia: She apparently has not been compliant with her Crestor due to joint aches.  She is only on the minimal dose of Crestor.  I offered her to use Crestor 3 times a week to see if she can tolerate a lower dose, she says she does not wish to take statin altogether.    Medication Adjustments/Labs and Tests Ordered: Current medicines are reviewed at length with the patient today.  Concerns regarding medicines are outlined above.  Medication changes, Labs and Tests ordered today are listed in the Patient Instructions below. Patient Instructions  Your physician recommends that you continue on your current medications as directed. Please refer to the Current Medication list given to you today.  Your physician recommends that you return  for lab work in: TODAY BMET   Your physician has requested that you have an echocardiogram. Echocardiography is a painless test that uses sound waves to create  images of your heart. It provides your doctor with information about the size and shape of your heart and how well your heart's chambers and valves are working. This procedure takes approximately one hour. There are no restrictions for this procedure.  DUE April 01 2018   Your physician recommends that you schedule a follow-up appointment in:  AS SCHEDULED    Signed, Almyra Deforest, [place]  01/09/2018 2:01 PM    [facility] [place], [place], [place]  [address] Phone: [phone]; Fax: [phone]

## 2018-01-15 DIAGNOSIS — I110 Hypertensive heart disease with heart failure: Secondary | ICD-10-CM | POA: Diagnosis not present

## 2018-01-15 DIAGNOSIS — I5022 Chronic systolic (congestive) heart failure: Secondary | ICD-10-CM | POA: Diagnosis not present

## 2018-01-15 DIAGNOSIS — Z853 Personal history of malignant neoplasm of breast: Secondary | ICD-10-CM | POA: Diagnosis not present

## 2018-01-15 DIAGNOSIS — I447 Left bundle-branch block, unspecified: Secondary | ICD-10-CM | POA: Diagnosis not present

## 2018-01-20 DIAGNOSIS — S32030K Wedge compression fracture of third lumbar vertebra, subsequent encounter for fracture with nonunion: Secondary | ICD-10-CM | POA: Diagnosis not present

## 2018-01-22 DIAGNOSIS — I447 Left bundle-branch block, unspecified: Secondary | ICD-10-CM | POA: Diagnosis not present

## 2018-01-22 DIAGNOSIS — I110 Hypertensive heart disease with heart failure: Secondary | ICD-10-CM | POA: Diagnosis not present

## 2018-01-22 DIAGNOSIS — Z853 Personal history of malignant neoplasm of breast: Secondary | ICD-10-CM | POA: Diagnosis not present

## 2018-01-22 DIAGNOSIS — I5022 Chronic systolic (congestive) heart failure: Secondary | ICD-10-CM | POA: Diagnosis not present

## 2018-02-03 ENCOUNTER — Ambulatory Visit (INDEPENDENT_AMBULATORY_CARE_PROVIDER_SITE_OTHER): Payer: Medicare Other | Admitting: Orthopaedic Surgery

## 2018-02-04 ENCOUNTER — Telehealth: Payer: Self-pay | Admitting: Cardiology

## 2018-02-04 MED ORDER — CARVEDILOL 6.25 MG PO TABS: 6.2500 mg | ORAL_TABLET | Freq: Two times a day (BID) | ORAL | 3 refills | Status: DC

## 2018-02-04 NOTE — Telephone Encounter (Signed)
New Message    *STAT* If patient is at the pharmacy, call can be transferred to refill team.   1. Which medications need to be refilled? (please list name of each medication and dose if known) carvedilol (COREG) 6.25 MG tablet 2 times daily with meals   2. Which pharmacy/location (including street and city if local pharmacy) is medication to be sent to? [facility] ([place]), [address]  3. Do they need a 30 day or 90 day supply? [place]

## 2018-02-04 NOTE — Telephone Encounter (Signed)
Rx has been sent to the pharmacy electronically. ° °

## 2018-02-05 ENCOUNTER — Other Ambulatory Visit: Payer: Self-pay | Admitting: *Deleted

## 2018-02-06 DIAGNOSIS — I5022 Chronic systolic (congestive) heart failure: Secondary | ICD-10-CM | POA: Diagnosis not present

## 2018-02-06 DIAGNOSIS — Z853 Personal history of malignant neoplasm of breast: Secondary | ICD-10-CM | POA: Diagnosis not present

## 2018-02-06 DIAGNOSIS — I447 Left bundle-branch block, unspecified: Secondary | ICD-10-CM | POA: Diagnosis not present

## 2018-02-15 DIAGNOSIS — I639 Cerebral infarction, unspecified: Secondary | ICD-10-CM

## 2018-02-15 HISTORY — DX: Cerebral infarction, unspecified: I63.9

## 2018-02-19 DIAGNOSIS — R05 Cough: Secondary | ICD-10-CM | POA: Diagnosis not present

## 2018-02-19 DIAGNOSIS — I5022 Chronic systolic (congestive) heart failure: Secondary | ICD-10-CM | POA: Diagnosis not present

## 2018-02-19 DIAGNOSIS — R0602 Shortness of breath: Secondary | ICD-10-CM | POA: Diagnosis not present

## 2018-02-23 DIAGNOSIS — I509 Heart failure, unspecified: Secondary | ICD-10-CM | POA: Diagnosis not present

## 2018-02-23 DIAGNOSIS — R9389 Abnormal findings on diagnostic imaging of other specified body structures: Secondary | ICD-10-CM | POA: Diagnosis not present

## 2018-02-28 ENCOUNTER — Emergency Department (HOSPITAL_COMMUNITY): Payer: Medicare Other

## 2018-02-28 ENCOUNTER — Inpatient Hospital Stay (HOSPITAL_COMMUNITY): Payer: Medicare Other

## 2018-02-28 ENCOUNTER — Inpatient Hospital Stay (HOSPITAL_COMMUNITY)
Admission: EM | Admit: 2018-02-28 | Discharge: 2018-03-03 | DRG: 023 | Disposition: A | Discharge status: Home Health | Payer: Medicare Other | Attending: Neurology | Admitting: Neurology

## 2018-02-28 ENCOUNTER — Encounter (HOSPITAL_COMMUNITY)
Admission: EM | Disposition: A | Discharge status: Home Health | Payer: Self-pay | Source: Home / Self Care | Attending: Neurology

## 2018-02-28 ENCOUNTER — Emergency Department (HOSPITAL_COMMUNITY): Payer: Medicare Other | Admitting: Certified Registered"

## 2018-02-28 ENCOUNTER — Other Ambulatory Visit (HOSPITAL_COMMUNITY): Payer: Medicare Other

## 2018-02-28 ENCOUNTER — Encounter (HOSPITAL_COMMUNITY): Payer: Self-pay | Admitting: Emergency Medicine

## 2018-02-28 DIAGNOSIS — E785 Hyperlipidemia, unspecified: Secondary | ICD-10-CM | POA: Diagnosis present

## 2018-02-28 DIAGNOSIS — I671 Cerebral aneurysm, nonruptured: Secondary | ICD-10-CM | POA: Diagnosis present

## 2018-02-28 DIAGNOSIS — J90 Pleural effusion, not elsewhere classified: Secondary | ICD-10-CM | POA: Diagnosis not present

## 2018-02-28 DIAGNOSIS — I5023 Acute on chronic systolic (congestive) heart failure: Secondary | ICD-10-CM | POA: Diagnosis not present

## 2018-02-28 DIAGNOSIS — I952 Hypotension due to drugs: Secondary | ICD-10-CM | POA: Diagnosis not present

## 2018-02-28 DIAGNOSIS — I5021 Acute systolic (congestive) heart failure: Secondary | ICD-10-CM | POA: Diagnosis not present

## 2018-02-28 DIAGNOSIS — G8929 Other chronic pain: Secondary | ICD-10-CM | POA: Diagnosis present

## 2018-02-28 DIAGNOSIS — Z79899 Other long term (current) drug therapy: Secondary | ICD-10-CM

## 2018-02-28 DIAGNOSIS — R531 Weakness: Secondary | ICD-10-CM | POA: Diagnosis not present

## 2018-02-28 DIAGNOSIS — I10 Essential (primary) hypertension: Secondary | ICD-10-CM | POA: Diagnosis not present

## 2018-02-28 DIAGNOSIS — I6609 Occlusion and stenosis of unspecified middle cerebral artery: Secondary | ICD-10-CM | POA: Diagnosis not present

## 2018-02-28 DIAGNOSIS — R0902 Hypoxemia: Secondary | ICD-10-CM | POA: Diagnosis not present

## 2018-02-28 DIAGNOSIS — Z8673 Personal history of transient ischemic attack (TIA), and cerebral infarction without residual deficits: Secondary | ICD-10-CM | POA: Insufficient documentation

## 2018-02-28 DIAGNOSIS — I480 Paroxysmal atrial fibrillation: Secondary | ICD-10-CM | POA: Diagnosis present

## 2018-02-28 DIAGNOSIS — I63511 Cerebral infarction due to unspecified occlusion or stenosis of right middle cerebral artery: Secondary | ICD-10-CM

## 2018-02-28 DIAGNOSIS — I6601 Occlusion and stenosis of right middle cerebral artery: Secondary | ICD-10-CM | POA: Diagnosis not present

## 2018-02-28 DIAGNOSIS — I609 Nontraumatic subarachnoid hemorrhage, unspecified: Secondary | ICD-10-CM | POA: Diagnosis present

## 2018-02-28 DIAGNOSIS — I639 Cerebral infarction, unspecified: Secondary | ICD-10-CM | POA: Diagnosis present

## 2018-02-28 DIAGNOSIS — R739 Hyperglycemia, unspecified: Secondary | ICD-10-CM | POA: Diagnosis not present

## 2018-02-28 DIAGNOSIS — I6521 Occlusion and stenosis of right carotid artery: Secondary | ICD-10-CM | POA: Diagnosis not present

## 2018-02-28 DIAGNOSIS — I5022 Chronic systolic (congestive) heart failure: Secondary | ICD-10-CM

## 2018-02-28 DIAGNOSIS — M546 Pain in thoracic spine: Secondary | ICD-10-CM | POA: Diagnosis present

## 2018-02-28 DIAGNOSIS — Z9889 Other specified postprocedural states: Secondary | ICD-10-CM | POA: Diagnosis not present

## 2018-02-28 DIAGNOSIS — M1990 Unspecified osteoarthritis, unspecified site: Secondary | ICD-10-CM | POA: Diagnosis present

## 2018-02-28 DIAGNOSIS — R29713 NIHSS score 13: Secondary | ICD-10-CM | POA: Diagnosis present

## 2018-02-28 DIAGNOSIS — I447 Left bundle-branch block, unspecified: Secondary | ICD-10-CM | POA: Diagnosis present

## 2018-02-28 DIAGNOSIS — E871 Hypo-osmolality and hyponatremia: Secondary | ICD-10-CM | POA: Diagnosis present

## 2018-02-28 DIAGNOSIS — R29810 Facial weakness: Secondary | ICD-10-CM | POA: Diagnosis present

## 2018-02-28 DIAGNOSIS — M545 Low back pain: Secondary | ICD-10-CM | POA: Diagnosis present

## 2018-02-28 DIAGNOSIS — Z853 Personal history of malignant neoplasm of breast: Secondary | ICD-10-CM

## 2018-02-28 DIAGNOSIS — Z9012 Acquired absence of left breast and nipple: Secondary | ICD-10-CM

## 2018-02-28 DIAGNOSIS — I110 Hypertensive heart disease with heart failure: Secondary | ICD-10-CM | POA: Diagnosis present

## 2018-02-28 DIAGNOSIS — I63411 Cerebral infarction due to embolism of right middle cerebral artery: Secondary | ICD-10-CM | POA: Diagnosis not present

## 2018-02-28 DIAGNOSIS — R402143 Coma scale, eyes open, spontaneous, at hospital admission: Secondary | ICD-10-CM | POA: Diagnosis present

## 2018-02-28 DIAGNOSIS — Z4682 Encounter for fitting and adjustment of non-vascular catheter: Secondary | ICD-10-CM | POA: Diagnosis not present

## 2018-02-28 DIAGNOSIS — J810 Acute pulmonary edema: Secondary | ICD-10-CM | POA: Diagnosis not present

## 2018-02-28 DIAGNOSIS — I509 Heart failure, unspecified: Secondary | ICD-10-CM

## 2018-02-28 DIAGNOSIS — R740 Nonspecific elevation of levels of transaminase and lactic acid dehydrogenase [LDH]: Secondary | ICD-10-CM | POA: Diagnosis present

## 2018-02-28 DIAGNOSIS — R402253 Coma scale, best verbal response, oriented, at hospital admission: Secondary | ICD-10-CM | POA: Diagnosis present

## 2018-02-28 DIAGNOSIS — I361 Nonrheumatic tricuspid (valve) insufficiency: Secondary | ICD-10-CM | POA: Diagnosis not present

## 2018-02-28 DIAGNOSIS — D72829 Elevated white blood cell count, unspecified: Secondary | ICD-10-CM | POA: Diagnosis present

## 2018-02-28 DIAGNOSIS — J9620 Acute and chronic respiratory failure, unspecified whether with hypoxia or hypercapnia: Secondary | ICD-10-CM | POA: Diagnosis present

## 2018-02-28 DIAGNOSIS — J9601 Acute respiratory failure with hypoxia: Secondary | ICD-10-CM

## 2018-02-28 DIAGNOSIS — G8194 Hemiplegia, unspecified affecting left nondominant side: Secondary | ICD-10-CM | POA: Diagnosis present

## 2018-02-28 DIAGNOSIS — Z7982 Long term (current) use of aspirin: Secondary | ICD-10-CM

## 2018-02-28 DIAGNOSIS — Z888 Allergy status to other drugs, medicaments and biological substances status: Secondary | ICD-10-CM

## 2018-02-28 DIAGNOSIS — Z01818 Encounter for other preprocedural examination: Secondary | ICD-10-CM

## 2018-02-28 DIAGNOSIS — N179 Acute kidney failure, unspecified: Secondary | ICD-10-CM | POA: Diagnosis present

## 2018-02-28 DIAGNOSIS — Z9221 Personal history of antineoplastic chemotherapy: Secondary | ICD-10-CM

## 2018-02-28 DIAGNOSIS — R402363 Coma scale, best motor response, obeys commands, at hospital admission: Secondary | ICD-10-CM | POA: Diagnosis present

## 2018-02-28 DIAGNOSIS — R4781 Slurred speech: Secondary | ICD-10-CM | POA: Diagnosis not present

## 2018-02-28 DIAGNOSIS — I340 Nonrheumatic mitral (valve) insufficiency: Secondary | ICD-10-CM | POA: Diagnosis not present

## 2018-02-28 DIAGNOSIS — Z4659 Encounter for fitting and adjustment of other gastrointestinal appliance and device: Secondary | ICD-10-CM

## 2018-02-28 HISTORY — PX: IR CT HEAD LTD: IMG2386

## 2018-02-28 HISTORY — PX: IR PERCUTANEOUS ART THROMBECTOMY/INFUSION INTRACRANIAL INC DIAG ANGIO: IMG6087

## 2018-02-28 HISTORY — PX: RADIOLOGY WITH ANESTHESIA: SHX6223

## 2018-02-28 LAB — POCT I-STAT 3, ART BLOOD GAS (G3+)
Acid-base deficit: 1 mmol/L (ref 0.0–2.0)
Acid-base deficit: 2 mmol/L (ref 0.0–2.0)
BICARBONATE: 24.1 mmol/L (ref 20.0–28.0)
Bicarbonate: 22 mmol/L (ref 20.0–28.0)
O2 Saturation: 100 %
O2 Saturation: 96 %
Patient temperature: 98.6
Patient temperature: 36.2
TCO2: 25 mmol/L (ref 22–32)
TCO2: 23 mmol/L (ref 22–32)
pCO2 arterial: 43 mmHg (ref 32.0–48.0)
pCO2 arterial: 34.9 mmHg (ref 32.0–48.0)
pH, Arterial: 7.357 (ref 7.350–7.450)
pH, Arterial: 7.405 (ref 7.350–7.450)
pO2, Arterial: 246 mmHg — ABNORMAL HIGH (ref 83.0–108.0)
pO2, Arterial: 78 mmHg — ABNORMAL LOW (ref 83.0–108.0)

## 2018-02-28 LAB — I-STAT CHEM 8, ED
BUN: 26 mg/dL — ABNORMAL HIGH (ref 8–23)
Calcium, Ion: 1.1 mmol/L — ABNORMAL LOW (ref 1.15–1.40)
Chloride: 99 mmol/L (ref 98–111)
Creatinine, Ser: 1 mg/dL (ref 0.44–1.00)
Glucose, Bld: 187 mg/dL — ABNORMAL HIGH (ref 70–99)
HCT: 39 % (ref 36.0–46.0)
Hemoglobin: 13.3 g/dL (ref 12.0–15.0)
Potassium: 4 mmol/L (ref 3.5–5.1)
Sodium: 134 mmol/L — ABNORMAL LOW (ref 135–145)
TCO2: 24 mmol/L (ref 22–32)

## 2018-02-28 LAB — CBC
HCT: 40.4 % (ref 36.0–46.0)
Hemoglobin: 12.8 g/dL (ref 12.0–15.0)
MCH: 30.9 pg (ref 26.0–34.0)
MCHC: 31.7 g/dL (ref 30.0–36.0)
MCV: 97.6 fL (ref 80.0–100.0)
Platelets: 227 10*3/uL (ref 150–400)
RBC: 4.14 MIL/uL (ref 3.87–5.11)
RDW: 14.6 % (ref 11.5–15.5)
WBC: 8 10*3/uL (ref 4.0–10.5)
nRBC: 0 % (ref 0.0–0.2)

## 2018-02-28 LAB — DIFFERENTIAL
Abs Immature Granulocytes: 0.03 10*3/uL (ref 0.00–0.07)
Basophils Absolute: 0.1 10*3/uL (ref 0.0–0.1)
Basophils Relative: 1 %
Eosinophils Absolute: 0.2 10*3/uL (ref 0.0–0.5)
Eosinophils Relative: 2 %
Immature Granulocytes: 0 %
Lymphocytes Relative: 22 %
Lymphs Abs: 1.8 10*3/uL (ref 0.7–4.0)
MONO ABS: 0.7 10*3/uL (ref 0.1–1.0)
Monocytes Relative: 9 %
Neutro Abs: 5.2 10*3/uL (ref 1.7–7.7)
Neutrophils Relative %: 66 %

## 2018-02-28 LAB — COMPREHENSIVE METABOLIC PANEL
ALK PHOS: 92 U/L (ref 38–126)
ALT: 57 U/L — ABNORMAL HIGH (ref 0–44)
AST: 54 U/L — ABNORMAL HIGH (ref 15–41)
Albumin: 3.5 g/dL (ref 3.5–5.0)
Anion gap: 16 — ABNORMAL HIGH (ref 5–15)
BILIRUBIN TOTAL: 1.8 mg/dL — AB (ref 0.3–1.2)
BUN: 25 mg/dL — ABNORMAL HIGH (ref 8–23)
CO2: 21 mmol/L — ABNORMAL LOW (ref 22–32)
Calcium: 9.2 mg/dL (ref 8.9–10.3)
Chloride: 97 mmol/L — ABNORMAL LOW (ref 98–111)
Creatinine, Ser: 1.18 mg/dL — ABNORMAL HIGH (ref 0.44–1.00)
GFR calc Af Amer: 48 mL/min — ABNORMAL LOW (ref >60)
GFR calc non Af Amer: 42 mL/min — ABNORMAL LOW (ref >60)
Glucose, Bld: 191 mg/dL — ABNORMAL HIGH (ref 70–99)
Potassium: 4 mmol/L (ref 3.5–5.1)
Sodium: 134 mmol/L — ABNORMAL LOW (ref 135–145)
TOTAL PROTEIN: 6.3 g/dL — AB (ref 6.5–8.1)

## 2018-02-28 LAB — GLUCOSE, CAPILLARY
GLUCOSE-CAPILLARY: 95 mg/dL (ref 70–99)
Glucose-Capillary: 112 mg/dL — ABNORMAL HIGH (ref 70–99)
Glucose-Capillary: 113 mg/dL — ABNORMAL HIGH (ref 70–99)
Glucose-Capillary: 171 mg/dL — ABNORMAL HIGH (ref 70–99)
Glucose-Capillary: 98 mg/dL (ref 70–99)

## 2018-02-28 LAB — LIPID PANEL
CHOL/HDL RATIO: 2.9 ratio
Cholesterol: 174 mg/dL (ref 0–200)
HDL: 60 mg/dL (ref >40)
LDL Cholesterol: 98 mg/dL (ref 0–99)
Triglycerides: 82 mg/dL (ref <150)
VLDL: 16 mg/dL (ref 0–40)

## 2018-02-28 LAB — HEMOGLOBIN A1C
Hgb A1c MFr Bld: 5.3 % (ref 4.8–5.6)
Mean Plasma Glucose: 105.41 mg/dL

## 2018-02-28 LAB — MAGNESIUM: Magnesium: 2 mg/dL (ref 1.7–2.4)

## 2018-02-28 LAB — APTT: aPTT: 29 seconds (ref 24–36)

## 2018-02-28 LAB — I-STAT TROPONIN, ED: TROPONIN I, POC: 0.01 ng/mL (ref 0.00–0.08)

## 2018-02-28 LAB — MRSA PCR SCREENING: MRSA by PCR: NEGATIVE

## 2018-02-28 LAB — PROTIME-INR
INR: 1.02
Prothrombin Time: 13.3 seconds (ref 11.4–15.2)

## 2018-02-28 LAB — TRIGLYCERIDES: Triglycerides: 57 mg/dL (ref <150)

## 2018-02-28 LAB — ETHANOL

## 2018-02-28 SURGERY — RADIOLOGY WITH ANESTHESIA
Anesthesia: General

## 2018-02-28 MED ORDER — LIDOCAINE 2% (20 MG/ML) 5 ML SYRINGE
INTRAMUSCULAR | Status: DC | PRN
  Administered 2018-02-28: 80 mg via INTRAVENOUS

## 2018-02-28 MED ORDER — TICAGRELOR 90 MG PO TABS
ORAL_TABLET | ORAL | Status: AC
  Filled 2018-02-28: qty 2

## 2018-02-28 MED ORDER — CHLORHEXIDINE GLUCONATE 0.12% ORAL RINSE (MEDLINE KIT)
15.0000 mL | Freq: Two times a day (BID) | OROMUCOSAL | Status: DC
  Administered 2018-02-28 – 2018-03-01 (×3): 15 mL via OROMUCOSAL

## 2018-02-28 MED ORDER — LIDOCAINE HCL 1 % IJ SOLN
INTRAMUSCULAR | Status: AC
  Filled 2018-02-28: qty 20

## 2018-02-28 MED ORDER — STROKE: EARLY STAGES OF RECOVERY BOOK
Freq: Once | Status: AC
  Administered 2018-02-28: 07:00:00
  Filled 2018-02-28: qty 1

## 2018-02-28 MED ORDER — CARVEDILOL 6.25 MG PO TABS
6.2500 mg | ORAL_TABLET | Freq: Two times a day (BID) | ORAL | Status: DC
  Administered 2018-02-28 – 2018-03-03 (×5): 6.25 mg via ORAL
  Filled 2018-02-28 (×3): qty 1
  Filled 2018-02-28: qty 2
  Filled 2018-02-28 (×3): qty 1
  Filled 2018-02-28: qty 2
  Filled 2018-02-28: qty 1
  Filled 2018-02-28 (×2): qty 2
  Filled 2018-02-28: qty 1

## 2018-02-28 MED ORDER — PROPOFOL 500 MG/50ML IV EMUL
INTRAVENOUS | Status: DC | PRN
  Administered 2018-02-28: 10 ug/kg/min via INTRAVENOUS

## 2018-02-28 MED ORDER — TIROFIBAN HCL IN NACL 5-0.9 MG/100ML-% IV SOLN
INTRAVENOUS | Status: AC
  Filled 2018-02-28: qty 100

## 2018-02-28 MED ORDER — ASPIRIN 325 MG PO TABS
325.0000 mg | ORAL_TABLET | Freq: Every day | ORAL | Status: DC
  Administered 2018-02-28 – 2018-03-03 (×4): 325 mg via ORAL
  Filled 2018-02-28 (×4): qty 1

## 2018-02-28 MED ORDER — ACETAMINOPHEN 650 MG RE SUPP
650.0000 mg | RECTAL | Status: DC | PRN
  Administered 2018-03-01: 650 mg via RECTAL
  Filled 2018-02-28: qty 1

## 2018-02-28 MED ORDER — ROSUVASTATIN CALCIUM 5 MG PO TABS
5.0000 mg | ORAL_TABLET | Freq: Every day | ORAL | Status: DC
  Administered 2018-02-28 – 2018-03-02 (×2): 5 mg via ORAL
  Filled 2018-02-28 (×2): qty 1

## 2018-02-28 MED ORDER — ACETAMINOPHEN 325 MG PO TABS: 650.0000 mg | ORAL_TABLET | ORAL | Status: DC | PRN

## 2018-02-28 MED ORDER — SODIUM CHLORIDE 0.9 % IV SOLN
INTRAVENOUS | Status: DC
  Administered 2018-02-28: 1 mL via INTRAVENOUS
  Administered 2018-03-02: 10:00:00 via INTRAVENOUS

## 2018-02-28 MED ORDER — CLEVIDIPINE BUTYRATE 0.5 MG/ML IV EMUL
0.0000 mg/h | INTRAVENOUS | Status: DC
  Administered 2018-02-28: 2 mg/h via INTRAVENOUS

## 2018-02-28 MED ORDER — IOPAMIDOL (ISOVUE-370) INJECTION 76%
100.0000 mL | Freq: Once | INTRAVENOUS | Status: AC | PRN
  Administered 2018-02-28: 100 mL via INTRAVENOUS

## 2018-02-28 MED ORDER — IOHEXOL 300 MG/ML  SOLN
150.0000 mL | Freq: Once | INTRAMUSCULAR | Status: AC | PRN
  Administered 2018-02-28: 45 mL via INTRA_ARTERIAL

## 2018-02-28 MED ORDER — POTASSIUM CHLORIDE CRYS ER 20 MEQ PO TBCR
20.0000 meq | EXTENDED_RELEASE_TABLET | Freq: Every day | ORAL | Status: DC
  Filled 2018-02-28: qty 1

## 2018-02-28 MED ORDER — FUROSEMIDE 20 MG PO TABS: 20.0000 mg | ORAL_TABLET | Freq: Every day | ORAL | Status: DC

## 2018-02-28 MED ORDER — ROCURONIUM BROMIDE 50 MG/5ML IV SOSY
PREFILLED_SYRINGE | INTRAVENOUS | Status: DC | PRN
  Administered 2018-02-28 (×2): 50 mg via INTRAVENOUS

## 2018-02-28 MED ORDER — CEFAZOLIN SODIUM-DEXTROSE 2-3 GM-%(50ML) IV SOLR
INTRAVENOUS | Status: DC | PRN
  Administered 2018-02-28: 2 g via INTRAVENOUS

## 2018-02-28 MED ORDER — NITROGLYCERIN 1 MG/10 ML FOR IR/CATH LAB
INTRA_ARTERIAL | Status: AC
  Administered 2018-02-28: 50 ug via INTRA_ARTERIAL
  Administered 2018-02-28 (×2): 25 ug via INTRA_ARTERIAL
  Filled 2018-02-28: qty 10

## 2018-02-28 MED ORDER — ACETAMINOPHEN 160 MG/5ML PO SOLN
650.0000 mg | ORAL | Status: DC | PRN
  Administered 2018-02-28: 650 mg
  Filled 2018-02-28: qty 20.3

## 2018-02-28 MED ORDER — INSULIN ASPART 100 UNIT/ML ~~LOC~~ SOLN
0.0000 [IU] | SUBCUTANEOUS | Status: DC
  Administered 2018-02-28: 2 [IU] via SUBCUTANEOUS

## 2018-02-28 MED ORDER — SODIUM CHLORIDE 0.9 % IV SOLN: INTRAVENOUS | Status: DC

## 2018-02-28 MED ORDER — IPRATROPIUM-ALBUTEROL 0.5-2.5 (3) MG/3ML IN SOLN: 3.0000 mL | RESPIRATORY_TRACT | Status: DC | PRN

## 2018-02-28 MED ORDER — POTASSIUM CHLORIDE 20 MEQ PO PACK
20.0000 meq | PACK | Freq: Every day | ORAL | Status: DC
  Administered 2018-02-28 – 2018-03-03 (×4): 20 meq via ORAL
  Filled 2018-02-28 (×4): qty 1

## 2018-02-28 MED ORDER — FAMOTIDINE IN NACL 20-0.9 MG/50ML-% IV SOLN
20.0000 mg | INTRAVENOUS | Status: DC
  Administered 2018-02-28 – 2018-03-01 (×2): 20 mg via INTRAVENOUS
  Filled 2018-02-28 (×3): qty 50

## 2018-02-28 MED ORDER — FENTANYL CITRATE (PF) 100 MCG/2ML IJ SOLN
50.0000 ug | INTRAMUSCULAR | Status: DC | PRN
  Administered 2018-02-28: 50 ug via INTRAVENOUS
  Filled 2018-02-28 (×2): qty 2

## 2018-02-28 MED ORDER — CLOPIDOGREL BISULFATE 300 MG PO TABS
ORAL_TABLET | ORAL | Status: AC
  Filled 2018-02-28: qty 1

## 2018-02-28 MED ORDER — ACETAMINOPHEN 650 MG RE SUPP: 650.0000 mg | RECTAL | Status: DC | PRN

## 2018-02-28 MED ORDER — DOCUSATE SODIUM 50 MG/5ML PO LIQD: 100.0000 mg | Freq: Two times a day (BID) | ORAL | Status: DC | PRN

## 2018-02-28 MED ORDER — ASPIRIN 325 MG PO TABS
ORAL_TABLET | ORAL | Status: AC
  Filled 2018-02-28: qty 1

## 2018-02-28 MED ORDER — CEFAZOLIN SODIUM-DEXTROSE 2-4 GM/100ML-% IV SOLN
INTRAVENOUS | Status: AC
  Filled 2018-02-28: qty 100

## 2018-02-28 MED ORDER — SODIUM CHLORIDE 0.9 % IV SOLN
INTRAVENOUS | Status: DC | PRN
  Administered 2018-02-28: 04:00:00 via INTRAVENOUS

## 2018-02-28 MED ORDER — EPTIFIBATIDE 20 MG/10ML IV SOLN
INTRAVENOUS | Status: AC
  Filled 2018-02-28: qty 10

## 2018-02-28 MED ORDER — SODIUM CHLORIDE 0.9 % IV SOLN
INTRAVENOUS | Status: DC | PRN
  Administered 2018-02-28: 20 ug/min via INTRAVENOUS

## 2018-02-28 MED ORDER — ORAL CARE MOUTH RINSE
15.0000 mL | OROMUCOSAL | Status: DC
  Administered 2018-02-28 – 2018-03-01 (×12): 15 mL via OROMUCOSAL

## 2018-02-28 MED ORDER — ETOMIDATE 2 MG/ML IV SOLN
INTRAVENOUS | Status: DC | PRN
  Administered 2018-02-28: 16 mg via INTRAVENOUS

## 2018-02-28 MED ORDER — FENTANYL CITRATE (PF) 100 MCG/2ML IJ SOLN
50.0000 ug | INTRAMUSCULAR | Status: DC | PRN
  Administered 2018-02-28 – 2018-03-01 (×2): 50 ug via INTRAVENOUS
  Filled 2018-02-28 (×2): qty 2

## 2018-02-28 MED ORDER — PROPOFOL 1000 MG/100ML IV EMUL
0.0000 ug/kg/min | INTRAVENOUS | Status: DC
  Administered 2018-02-28: 30 ug/kg/min via INTRAVENOUS
  Administered 2018-03-01: 25 ug/kg/min via INTRAVENOUS
  Filled 2018-02-28 (×2): qty 100

## 2018-02-28 MED ORDER — ACETAMINOPHEN 160 MG/5ML PO SOLN: 650.0000 mg | ORAL | Status: DC | PRN

## 2018-02-28 MED ORDER — BISACODYL 10 MG RE SUPP: 10.0000 mg | Freq: Every day | RECTAL | Status: DC | PRN

## 2018-02-28 MED ORDER — PHENYLEPHRINE HCL-NACL 10-0.9 MG/250ML-% IV SOLN
0.0000 ug/min | INTRAVENOUS | Status: DC
  Administered 2018-02-28: 20 ug/min via INTRAVENOUS
  Administered 2018-03-01: 50 ug/min via INTRAVENOUS
  Administered 2018-03-01: 30 ug/min via INTRAVENOUS
  Administered 2018-03-01: 48 ug/min via INTRAVENOUS
  Administered 2018-03-01: 45 ug/min via INTRAVENOUS
  Filled 2018-02-28 (×5): qty 250

## 2018-02-28 NOTE — ED Notes (Signed)
Pt transported to IR. IR RN given report.

## 2018-02-28 NOTE — Progress Notes (Signed)
PT Cancellation Note  Patient Details Name: Haley Costa MRN: [id number] DOB: 1928-12-06   Cancelled Treatment:    Reason Eval/Treat Not Completed: Patient not medically ready;Active bedrest order   Duncan Dull 02/28/2018, 9:26 AM Alben Deeds, PT DPT  Board Certified Neurologic Specialist Acute Rehabilitation Services Pager [phone] Office [phone]

## 2018-02-28 NOTE — Anesthesia Procedure Notes (Signed)
Procedure Name: Intubation Date/Time: 02/28/2018 4:21 AM Performed by: Babs Bertin, CRNA Pre-anesthesia Checklist: Patient identified, Emergency Drugs available, Suction available and Patient being monitored Patient Re-evaluated:Patient Re-evaluated prior to induction Oxygen Delivery Method: Circle System Utilized Preoxygenation: Pre-oxygenation with 100% oxygen Induction Type: IV induction Ventilation: Mask ventilation without difficulty Laryngoscope Size: Mac and 3 Grade View: Grade I Tube type: Subglottic suction tube Tube size: 7.0 mm Number of attempts: 1 Airway Equipment and Method: Stylet and Oral airway Placement Confirmation: ETT inserted through vocal cords under direct vision,  positive ETCO2 and breath sounds checked- equal and bilateral Secured at: 21 cm Tube secured with: Tape Dental Injury: Teeth and Oropharynx as per pre-operative assessment

## 2018-02-28 NOTE — Progress Notes (Signed)
Pt transported on vent to MRI and returned to [id number] without complications.

## 2018-02-28 NOTE — Progress Notes (Signed)
STROKE TEAM PROGRESS NOTE   SUBJECTIVE (INTERVAL HISTORY) Her RN and CCM NP are at the bedside.  Patient still intubated, but eyes open, follow commands.  Still has left leg weakness.  Still has left leg weakness.  MRI/MRA pending   OBJECTIVE Vitals:   02/28/18 0800 02/28/18 0802 02/28/18 0815 02/28/18 0830  BP: 125/65 125/65 (!) 112/92 (!) 143/76  Pulse: 94 96 99 94  Resp: (!) 24 18 17 20   Temp: (!) 97.2 F (36.2 C)  (!) 97.2 F (36.2 C) (!) 97 F (36.1 C)  TempSrc:      SpO2: 93% 92% 97% 98%  Weight:      Height:        CBC:  Recent Labs  Lab 02/28/18 0303 02/28/18 0305  WBC 8.0  --   NEUTROABS 5.2  --   HGB 12.8 13.3  HCT 40.4 39.0  MCV 97.6  --   PLT 227  --     Basic Metabolic Panel:  Recent Labs  Lab 02/28/18 0303 02/28/18 0305  NA 134* 134*  K 4.0 4.0  CL 97* 99  CO2 21*  --   GLUCOSE 191* 187*  BUN 25* 26*  CREATININE 1.18* 1.00  CALCIUM 9.2  --     Lipid Panel:     Component Value Date/Time   CHOL 205 (H) 11/21/2017 0438   TRIG 52 11/21/2017 0438   HDL 53 11/21/2017 0438   CHOLHDL 3.9 11/21/2017 0438   VLDL 10 11/21/2017 0438   LDLCALC 142 (H) 11/21/2017 0438   HgbA1c:  Lab Results  Component Value Date   HGBA1C 5.3 11/21/2017   Urine Drug Screen: No results found for: LABOPIA, COCAINSCRNUR, LABBENZ, AMPHETMU, THCU, LABBARB  Alcohol Level     Component Value Date/Time   ETH <10 02/28/2018 0303    IMAGING  Ct Angio Head W Or Wo Contrast Ct Angio Neck W Or Wo Contrast Ct Cerebral Perfusion W Contrast 02/28/2018 IMPRESSION:   CTA NECK:  1. No hemodynamically significant stenosis ICA's. Patent vertebral arteries.  2. Small pleural effusions and findings of pulmonary edema.   CTA HEAD:  1. Emergent RIGHT M1 occlusion. Good collaterals by single-phase CTA.  2. 3 mm RIGHT paraophthalmic artery aneurysm. Neuro-Interventional Radiology consultation is suggested to evaluate the appropriateness of potential treatment.   CT  PERFUSION:  1. RIGHT MCA penumbra.  Aortic Atherosclerosis (ICD10-I70.0).  Emphysema (ICD10-J43.9).    Ct Head Code Stroke Wo Contrast 02/28/2018 IMPRESSION:  1. No acute intracranial process.  2. ASPECTS is 10.  3. Small LEFT occipital lobe infarct, likely chronic.   Otherwise negative CT HEAD without contrast for age.    Dg Abd 1 View 02/28/2018 IMPRESSION:  Enteric tube projects into the expected location of the stomach.    Dg Chest Port 1 View 02/28/2018 IMPRESSION:  1. Satisfactory position of the endotracheal tube.  2. Enteric tube projects to the level of the left lung base. The patient has already had a follow-up abdominal radiograph at 0733 hours demonstrating repositioning of the tube in the stomach.  3. Pulmonary edema with bilateral pleural effusions.   DSA  S/P RT common carotid arteriogram followed by complete revascularization of occluded RT MCA M 1 seg with x 1 pass with 10mm x 93mm embotrap retriever device achieving a TICI 3 revascularization. Approx 24mm x 6 mm  bilobed RT PCOM aneurysm.  Transthoracic Echocardiogram  Pending  MRI and MRA pending   PHYSICAL EXAM  Temp:  [97 F (36.1 C)-98.1  F (36.7 C)] 97.9 F (36.6 C) (12/14 1015) Pulse Rate:  [47-106] 89 (12/14 1015) Resp:  [14-25] 14 (12/14 1015) BP: (108-158)/(63-108) 108/63 (12/14 1000) SpO2:  [92 %-100 %] 100 % (12/14 1015) Arterial Line BP: (101-171)/(52-95) 121/59 (12/14 1015) FiO2 (%):  [60 %-100 %] 60 % (12/14 0802) Weight:  [64 kg] 64 kg (12/14 0645)  General - Well nourished, well developed, intubated off sedation.  Ophthalmologic - fundi not visualized due to noncooperation.  Cardiovascular - irregularly irregular heart rate and rhythm.  Neuro - lethargic, but awake with eyes open, intubated off sedation, able to follow simple commands. Eyes middle position, incomplete on bilateral gaze, not consistent blinking to visual threat bilaterally, PERRL, facial symmetry not able to  test due to ET tube.  Tongue midline in mouth.  Bilateral upper extremity 3/5, right lower extremity 4/5, left lower extremity 3+/5.  DTR 1+, no Babinski.Sensation, coordination and gait not tested.   ASSESSMENT/PLAN Haley Costa is a 84 y.o. female with history of CHF with EF 25 to 30%, HTN, HLD, LBBB and left breast cancer status post surgery and chemotherapy presenting with Lt sided weakness.  She did not receive IV t-PA due to late presentation.  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 fib  Resultant intubated with left leg weakness  CT head - No acute intracranial process. Small LEFT occipital lobe infarct, likely chronic.  CTA H&N - Emergent RIGHT M1 occlusion.  CTP positive for penumbra  DSA - S/P revascularization of occluded RT MCA M 1 achieving a TICI 3 revascularization.  MRI head - pending  MRA head - pending  2D Echo - pending  LDL - pending  HgbA1c - pending  UDS - pending  VTE prophylaxis - SCDs  Diet - NPO  aspirin 81 mg daily prior to admission, now on aspirin 325 mg daily.  Patient counseled to be compliant with her antithrombotic medications  Ongoing aggressive stroke risk factor management  Therapy recommendations:  pending  Disposition:  Pending  Afib, new diagnosis  Found on telemetry monitoring this morning  EKG stat to capture A. fib episode  On Coreg 6.25  Old off AC for now pending infarct size  CHF  TTE pending  TTE in 11/2016 showed EF 25 to 30%  Following with cardiology on Coreg and losartan  Coreg resumed  CCM on board  Respiratory failure  Intubated for procedure  Still on ventilation  Weaning trials if possible  CCM on board  Hypertension  Stable . BP goal less than 140 for 24 hours post procedure . On Coreg . Off Cleviprex . Long-term BP goal normotensive  Hyperlipidemia  Lipid lowering  medication PTA:  Crestor 5 mg daily  LDL pending, goal < 70  Current lipid lowering medication: Crestor 5 mg daily  Continue statin at discharge  PCOM aneurysm   DSA showed 90mm x 6 mm  bilobed RT PCOM aneurysm  Incidental finding  Follow-up with Dr. [name] as outpt  Other Stroke Risk Factors  Advanced age  Hx stroke/TIA - by imaging   Other Active Problems  Hyperglycemia   Hospital day # 0  This patient is critically ill due to right MCA infarct, CHF, right MCA occlusion status post mechanical thrombectomy, A. fib, cerebral aneurysm and at significant risk of neurological worsening, death form recurrent stroke, hemorrhagic conversion, heart failure, seizure. This patient's care requires  constant monitoring of vital signs, hemodynamics, respiratory and cardiac monitoring, review of multiple databases, neurological assessment, discussion with family, other specialists and medical decision making of high complexity. I spent 40 minutes of neurocritical care time in the care of this patient.  I also discussed with CCM NP.  Rosalin Hawking, MD PhD Stroke Neurology 02/28/2018 11:08 AM     To contact Stroke Continuity provider, please refer to [email or web address] After hours, contact General Neurology

## 2018-02-28 NOTE — Progress Notes (Signed)
[place] with Neurology.  Patient remains on vent. MRI planned.  Ok to extubate when meets criteria from Neurology standpoint.    Noe Gens, NP-C [facility] Pgr: [phone] or if no answer [phone] 02/28/2018, 10:16 AM

## 2018-02-28 NOTE — Progress Notes (Signed)
Patient ID: Haley Costa, female   DOB: 1929/02/20, 84 y.o.   MRN: [id number] INR. 30 Y F LSW [address].Premorbid mRSS 1 to 2 Lt sided weakness  And lt facial droop. CT Brain No ICH ASPECTS 10 CTA Occluded RT MCA M 1 . CTP CBF < 30 % vol 0.Tmax> 6.0s vol 173ml. Mismatch 122ml. Endovascular option to revascularize D/W son and family. ReEasons,risks alternatives revieweed. Risks of ICH of 10 % ,worsening neuro deficits,vent dependency,death ,inability to revascularize ,vascular injury all discussed.Questions answered to their satisfaction.informed  witnessed consent obtained.Marland Kitchen S.Onetha Gaffey MD

## 2018-02-28 NOTE — Anesthesia Procedure Notes (Signed)
Arterial Line Insertion Start/End12/14/2019 4:28 AM, 02/28/2018 4:30 AM Performed by: Catalina Gravel, MD, Zarya Lasseigne, Kathrin Penner, CRNA, CRNA  Patient location: OR. Preanesthetic checklist: patient identified, IV checked, monitors and equipment checked and pre-op evaluation Left, radial was placed Catheter size: 18 G Maximum sterile barriers used   Attempts: 1 Procedure performed without using ultrasound guided technique. Ultrasound Notes:anatomy identified Following insertion, Biopatch.

## 2018-02-28 NOTE — H&P (Signed)
Neurology H&P  CC: Left-sided weakness  History is obtained from: Patient  HPI: Haley Costa is a 84 y.o. female with a history of heart failure who presents with left-sided weakness that started tonight.  She went to bed about 10 PM and when she awoke she was unable to roll over.  She called her son who came in and found her with left-sided weakness and therefore called 911.  She is brought into the emergency department where an M1 occlusion with significant penumbra was identified and I discussed intervention with the family and patient who wished to proceed.  Of note, she has been DNR but this is in the setting of cardiac arrest, not for procedures.  At baseline, she does have some limitation due to her heart failure with shortness of breath.  She is, however, able to attend to all of her own activities of daily living.  LKW: 10 PM tpa given?: no, outside of window Modified Rankin Scale: 2-Slight disability-UNABLE to perform all activities but does not need assistance  ROS: A 14 point ROS was performed and is negative except as noted in the HPI.   Past Medical History:  Diagnosis Date  . Breast cancer, left breast ([place]) 1992   S/P mastectomy & chemo  . CHF (congestive heart failure) ([place])   . Chronic back pain    "lower and middle back; I've had several broken vertebrae" (12/09/2017)  . Heart murmur   . High cholesterol   . History of kidney stones   . Hypertension   . LBBB (left bundle branch block)    Patient states that she was told by her PCP that she has left bundle blockage  . Osteoarthritis    "some of the joints" (12/09/2017)  . Pneumonia    "when I was a child" (12/09/2017)  . PONV (postoperative nausea and vomiting)      Family History  Problem Relation Age of Onset  . Cervical cancer Mother   . Hypertension Brother      Social History:  reports that she has never smoked. She has never used smokeless tobacco. She reports that she does not drink alcohol or  use drugs.   Exam: Current vital signs: BP (!) 147/87   Pulse 85   Resp (!) 25   SpO2 95%  Vital signs in last 24 hours: Pulse Rate:  [47-85] 85 (12/14 0345) Resp:  [16-25] 25 (12/14 0345) BP: (131-147)/(87) 147/87 (12/14 0345) SpO2:  [93 %-95 %] 95 % (12/14 0345)  Physical Exam  Constitutional: Appears well-developed and well-nourished.  Psych: Affect appropriate to situation Eyes: No scleral injection HENT: No OP obstrucion Head: Normocephalic.  Cardiovascular: Normal rate and regular rhythm.  Respiratory: Effort normal and breath sounds with decreased at the bases some crackles on the left GI: Soft.  No distension. There is no tenderness.  Skin: WDI  Neuro: Mental Status: Patient is awake, alert, oriented to person, place, month, year, age Patient is able to give a clear and coherent history. No signs of aphasia, she has mild neglect Cranial Nerves: II: Visual Fields are full. Pupils are equal, round, and reactive to light.   III,IV, VI: She has a right gaze preference V: Facial sensation is diminished on the left VII: Facial movement is weak on the left VIII: hearing is intact to voice X: Uvula elevates symmetrically XI: Shoulder shrug is symmetric. XII: tongue is midline without atrophy or fasciculations.  Motor: Tone is normal. Bulk is normal. 5/5 strength was present  on the right, Sensory: Sensation is symmetric to light touch and temperature in the arms and legs. Cerebellar: FNF  intact on the right   I have reviewed labs in epic and the results pertinent to this consultation are: Hyponatremia at 134 Creatinine 1.18  I have reviewed the images obtained: CT A/P- right M1 occlusion with significant penumbra no infarct  Primary Diagnosis:  Cerebral infarction due to embolism of right middle cerebral artery  Secondary Diagnosis: Heart failure, unspecified Hyponatremia  Impression: 84 year old female with right M1 occlusion.  I suspect this may be  embolic due to her heart failure, work-up will need to be completed  Recommendations: - HgbA1c, fasting lipid panel - MRI of the brain without contrast - Frequent neuro checks - Echocardiogram - Prophylactic therapy-Antiplatelet med: Aspirin - dose 325mg  if no hemorrhage on postop CT - Risk factor modification - Telemetry monitoring - PT consult, OT consult, Speech consult - Stroke team to follow   This patient is critically ill and at significant risk of neurological worsening, death and care requires constant monitoring of vital signs, hemodynamics,respiratory and cardiac monitoring, neurological assessment, discussion with family, other specialists and medical decision making of high complexity. I spent 60 minutes of neurocritical care time  in the care of  this patient.  Roland Rack, MD [facility] [phone]  If 7pm- 7am, please page neurology on call as listed in [place]. 02/28/2018  4:37 AM

## 2018-02-28 NOTE — Progress Notes (Addendum)
Patient ID: Haley Costa, female   DOB: [birth year], 84 y.o.   MRN: [id number] INR.Post procedure CT-scan of the abdomen no gross hemorrhages or mass effect noted. RT groin soft . Distal pulses dopplerable.Marland Kitchen  S.Aniesa Boback MD

## 2018-02-28 NOTE — Progress Notes (Signed)
Patient arrived on the unit around 0639. First assessment was done at this time. CRNA stated that patient still had paralytics on board from IR. Patient was unresponsive and NIH was not able to be completed at this time. Initially, sedation was titrated up, to make sure patient was sedated and paralyzed. Sedation was then titrated down after shift change to allow a neurological exam. Day shift nurse will continue to monitor patient frequently.

## 2018-02-28 NOTE — Progress Notes (Signed)
Initial Nutrition Assessment  DOCUMENTATION CODES:  Not applicable  INTERVENTION:  If unable to extubate tomorrow, appropriate TF recommendations would be as follows.   Vital High Protein at goal rate of 45 ml/h (1080 ml per day) to provide 1080 kcals,  95 gm protein, 902 ml free water daily.  NUTRITION DIAGNOSIS:  Inadequate oral intake related to inability to eat as evidenced by NPO status.  GOAL:  Patient will meet greater than or equal to 90% of their needs  MONITOR:  PO intake, Vent status, TF tolerance, I & O's  REASON FOR ASSESSMENT:  Ventilator    ASSESSMENT:  84 y/o female PMHx CHF, HTN, Chronic back pain, remote hx of breast cancer. Presented via EMS d/t acute onset L side weakness. CT of head revealed M1 occlusion s/p intubation & revascularization. Remains intubated post-procedure.   Pt intubated, but follows commands. She was noted as being a positive MST. She nods her head 'yes' when asked if she has lost wt and also 'yes' when asked if she has had poor appetite. RN notes the granddaughter had reported the pt has lost 6 lbs in a recent short span of time, though this potentially may have been related to diuretic medications.   From charted weight measurements, it looks the patient had reported a UBW of 150 lbs in the past. She was weighed 144.6 lbs in August and presented this admission at 141.1 lbs. Today, her bed weight is ~ 137 lbs, which reflects a loss of 5% bw since August.   Per RN, pt to likely remain intubated today, with plans to extubate tomorrow. Will leave TF recommendations in event this is unsuccessful. Can follow up on MST screen when patient is able to more capably converse.   Patient is currently intubated on ventilator support MV: 7.5 L/min Temp (24hrs), Avg:97.4 F (36.3 C), Min:97 F (36.1 C), Max:98.1 F (36.7 C) Propofol: currently held  Labs: Bg:170-190, TG: 57 Meds: insulin, KCL Cleviprex (hung, currently held), IVF, Diprivan (also  currently held), IV abx    Recent Labs  Lab 02/28/18 0303 02/28/18 0305 02/28/18 1010  NA 134* 134*  --   K 4.0 4.0  --   CL 97* 99  --   CO2 21*  --   --   BUN 25* 26*  --   CREATININE 1.18* 1.00  --   CALCIUM 9.2  --   --   MG  --   --  2.0  GLUCOSE 191* 187*  --    NUTRITION - FOCUSED PHYSICAL EXAM:   Most Recent Value  Orbital Region  Mild depletion  Upper Arm Region  No depletion  Thoracic and Lumbar Region  No depletion  Buccal Region  No depletion  Temple Region  No depletion  Clavicle Bone Region  Mild depletion  Clavicle and Acromion Bone Region  No depletion  Scapular Bone Region  No depletion  Dorsal Hand  No depletion  Patellar Region  No depletion  Anterior Thigh Region  No depletion  Posterior Calf Region  No depletion  Edema (RD Assessment)  Mild       Diet Order:   Diet Order            Diet NPO time specified  Diet effective now             EDUCATION NEEDS:  No education needs have been identified at this time  Skin:  Skin Assessment: Reviewed RN Assessment  Last BM:  Unknown  Height:  Ht Readings from Last 1 Encounters:  02/28/18 5\' 2"  (1.575 m)   Weight:  Wt Readings from Last 1 Encounters:  02/28/18 62.3 kg   Wt Readings from Last 10 Encounters:  02/28/18 62.3 kg  01/09/18 64 kg  12/17/17 64.4 kg  12/09/17 65.3 kg  11/25/17 60.4 kg  05/15/12 68 kg   Ideal Body Weight:  50 kg  BMI:  Body mass index is 25.12 kg/m.  Estimated Nutritional Needs:  Kcal:  1130 kcals (psu 2003b) Protein:  87-100g Pro (1.4-1.6g/kg bw) Fluid:  >1.6 L fluid  Burtis Junes RD, LDN, CNSC Clinical Nutrition Available Tues-Sat via Pager: [phone] 02/28/2018 1:17 PM

## 2018-02-28 NOTE — ED Provider Notes (Signed)
[facility] EMERGENCY DEPARTMENT Provider Note   CSN: [id number] Arrival date & time: 02/28/18  0256     History   Chief Complaint Chief Complaint  Patient presents with  . Code Stroke    HPI Haley Costa is a 84 y.o. female.  Went to bed at 2200. Couldn't roll over just PTA and son went to help her and found her to have L sided weakness and grip strength.    Neurologic Problem  This is a new problem. The current episode started 3 to 5 hours ago. The problem occurs constantly. The problem has not changed since onset.Pertinent negatives include no chest pain. Nothing aggravates the symptoms. Nothing relieves the symptoms. She has tried nothing for the symptoms.    Past Medical History:  Diagnosis Date  . Breast cancer, left breast ([place]) 1992   S/P mastectomy & chemo  . CHF (congestive heart failure) ([place])   . Chronic back pain    "lower and middle back; I've had several broken vertebrae" (12/09/2017)  . Heart murmur   . High cholesterol   . History of kidney stones   . Hypertension   . LBBB (left bundle branch block)    Patient states that she was told by her PCP that she has left bundle blockage  . Osteoarthritis    "some of the joints" (12/09/2017)  . Pneumonia    "when I was a child" (12/09/2017)  . PONV (postoperative nausea and vomiting)     Patient Active Problem List   Diagnosis Date Noted  . Hyponatremia 12/09/2017  . Acute exacerbation of CHF (congestive heart failure) ([place]) 11/22/2017  . Chest pain 11/21/2017  . Acute CHF (congestive heart failure) ([place]) 11/21/2017  . HTN (hypertension) 11/21/2017    Past Surgical History:  Procedure Laterality Date  . APPENDECTOMY    . BACK SURGERY    . CATARACT EXTRACTION W/ INTRAOCULAR LENS IMPLANT Right   . FIXATION KYPHOPLASTY THORACIC SPINE  2014  . MASTECTOMY Left 1992     OB History   No obstetric history on file.      Home Medications    Prior to Admission medications     Medication Sig Start Date End Date Taking? Authorizing Provider  aspirin EC 81 MG EC tablet Take 1 tablet (81 mg total) by mouth daily. 11/26/17   [name], [name], MD  carvedilol (COREG) 6.25 MG tablet Take 1 tablet (6.25 mg total) by mouth 2 (two) times daily with a meal. 02/04/18   [name], [name], MD  furosemide (LASIX) 40 MG tablet Take 0.5 tablets (20 mg total) by mouth daily. 12/10/17   [name], [name], MD  losartan (COZAAR) 25 MG tablet Take 1 tablet (25 mg total) by mouth daily. 11/26/17   [name], [name], MD  potassium chloride 20 MEQ TBCR Take 20 mEq by mouth daily. 12/10/17   [name], [name], MD  rosuvastatin (CRESTOR) 5 MG tablet Take 1 tablet (5 mg total) by mouth daily at 6 PM. 11/25/17   [name], [name], MD    Family History Family History  Problem Relation Age of Onset  . Cervical cancer Mother   . Hypertension Brother     Social History Social History   Tobacco Use  . Smoking status: Never Smoker  . Smokeless tobacco: Never Used  Substance Use Topics  . Alcohol use: Never    Frequency: Never  . Drug use: Never     Allergies   Chlorthalidone; Lisinopril; and  Persantine [dipyridamole]   Review of Systems Review of Systems  Unable to perform ROS: Acuity of condition  Cardiovascular: Negative for chest pain.     Physical Exam Updated Vital Signs BP (!) 147/87   Pulse 85   Resp (!) 25   SpO2 95%   Physical Exam Vitals signs and nursing note reviewed.  Constitutional:      Appearance: She is well-developed.  HENT:     Head: Normocephalic and atraumatic.     Nose: Nose normal.     Mouth/Throat:     Mouth: Mucous membranes are moist.  Eyes:     Conjunctiva/sclera: Conjunctivae normal.  Neck:     Musculoskeletal: Normal range of motion.  Cardiovascular:     Rate and Rhythm: Normal rate and regular rhythm.  Pulmonary:     Effort: Pulmonary effort is normal. No respiratory distress.     Breath sounds: No stridor.  Abdominal:      General: Abdomen is flat. There is no distension.  Musculoskeletal: Normal range of motion.  Skin:    General: Skin is warm and dry.  Neurological:     Mental Status: She is alert.      ED Treatments / Results  Labs (all labs ordered are listed, but only abnormal results are displayed) Labs Reviewed  I-STAT CHEM 8, ED - Abnormal; Notable for the following components:      Result Value   Sodium 134 (*)    BUN 26 (*)    Glucose, Bld 187 (*)    Calcium, Ion 1.10 (*)    All other components within normal limits  PROTIME-INR  APTT  CBC  DIFFERENTIAL  ETHANOL  COMPREHENSIVE METABOLIC PANEL  RAPID URINE DRUG SCREEN, HOSP PERFORMED  URINALYSIS, ROUTINE W REFLEX MICROSCOPIC  I-STAT TROPONIN, ED    EKG None  Radiology Ct Angio Head W Or Wo Contrast  Result Date: 02/28/2018 CLINICAL DATA:  Slurred speech, weakness. EXAM: CT ANGIOGRAPHY HEAD AND NECK CT PERFUSION BRAIN TECHNIQUE: Multidetector CT imaging of the head and neck was performed using the standard protocol during bolus administration of intravenous contrast. Multiplanar CT image reconstructions and MIPs were obtained to evaluate the vascular anatomy. Carotid stenosis measurements (when applicable) are obtained utilizing NASCET criteria, using the distal internal carotid diameter as the denominator. Multiphase CT imaging of the brain was performed following IV bolus contrast injection. Subsequent parametric perfusion maps were calculated using RAPID software. CONTRAST:  167mL ISOVUE-370 IOPAMIDOL (ISOVUE-370) INJECTION 76% COMPARISON:  CT HEAD February 28, 2018. FINDINGS: CTA NECK FINDINGS: AORTIC ARCH: Normal appearance of the thoracic arch, normal branch pattern. Moderate calcific atherosclerosis aortic arch. The origins of the innominate, left Common carotid artery and subclavian artery are widely patent. RIGHT CAROTID SYSTEM: Common carotid artery is patent. Moderate calcific atherosclerosis resulting in less than 50% stenosis  by NASCET criteria. Patent internal carotid artery. LEFT CAROTID SYSTEM: Common carotid artery is patent. Severe calcific atherosclerosis resulting in less than 50% stenosis by NASCET criteria. Patent internal carotid artery. VERTEBRAL ARTERIES:Left vertebral artery is dominant. Normal appearance of the vertebral arteries, widely patent. SKELETON: No acute osseous process though bone windows have not been submitted. Surgical clips LEFT axilla. OTHER NECK: Soft tissues of the neck are nonacute though, not tailored for evaluation. UPPER CHEST: Small bilateral pleural effusions. Interlobular septal thickening. Diffuse bronchial wall thickening seen with pulmonary edema, bronchitis. Mild debris distended stomach. Centrilobular emphysema. CTA HEAD FINDINGS: ANTERIOR CIRCULATION: Patent cervical internal carotid arteries, petrous, cavernous and supra clinoid internal carotid  arteries. 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 Patent anterior communicating artery. Proximal RIGHT M1 occlusion. Good collaterals by single-phase CTA. Patent anterior a LEFT nd middle cerebral arteries, mild luminal irregularity compatible with atherosclerosis. No large vessel occlusion, significant stenosis, contrast extravasation. POSTERIOR CIRCULATION: Patent vertebral arteries, vertebrobasilar junction and basilar artery, as well as main branch vessels. Patent posterior cerebral arteries. No large vessel occlusion, significant stenosis, contrast extravasation or aneurysm. VENOUS SINUSES: Major dural venous sinuses are patent though not tailored for evaluation on this angiographic examination. ANATOMIC VARIANTS: Supernumerary anterior cerebral artery. Hypoplastic RIGHT A1 segment. DELAYED PHASE: Not performed. MIP images reviewed. CT Brain Perfusion Findings: CBF (<30%) Volume: 104mL Perfusion (Tmax>6.0s) volume: 158mL Mismatch Volume: 132mL Infarction Location:RIGHT frontotemporal parietal lobes. IMPRESSION: CTA NECK: 1.  No hemodynamically significant stenosis ICA's. Patent vertebral arteries. 2. Small pleural effusions and findings of pulmonary edema. CTA HEAD: 1. Emergent RIGHT M1 occlusion. Good collaterals by single-phase CTA. 2. 3 mm RIGHT paraophthalmic artery aneurysm. Neuro-Interventional Radiology consultation is suggested to evaluate the appropriateness of potential treatment. Non-emergent evaluation can be arranged by calling [phone] during usual hours. Emergency evaluation can be requested by paging [phone]. CT PERFUSION: 1. RIGHT MCA penumbra. Acute findings discussed with and reconfirmed by Dr.MCNEILL [facility] on 02/28/2018 at 3:50 am. Aortic Atherosclerosis (ICD10-I70.0). Emphysema (ICD10-J43.9). Electronically Signed   By: Elon Alas M.D.   On: 02/28/2018 03:50   Ct Angio Neck W Or Wo Contrast  Result Date: 02/28/2018 CLINICAL DATA:  Slurred speech, weakness. EXAM: CT ANGIOGRAPHY HEAD AND NECK CT PERFUSION BRAIN TECHNIQUE: Multidetector CT imaging of the head and neck was performed using the standard protocol during bolus administration of intravenous contrast. Multiplanar CT image reconstructions and MIPs were obtained to evaluate the vascular anatomy. Carotid stenosis measurements (when applicable) are obtained utilizing NASCET criteria, using the distal internal carotid diameter as the denominator. Multiphase CT imaging of the brain was performed following IV bolus contrast injection. Subsequent parametric perfusion maps were calculated using RAPID software. CONTRAST:  124mL ISOVUE-370 IOPAMIDOL (ISOVUE-370) INJECTION 76% COMPARISON:  CT HEAD February 28, 2018. FINDINGS: CTA NECK FINDINGS: AORTIC ARCH: Normal appearance of the thoracic arch, normal branch pattern. Moderate calcific atherosclerosis aortic arch. The origins of the innominate, left Common carotid artery and subclavian artery are widely patent. RIGHT CAROTID SYSTEM: Common carotid artery is patent. Moderate calcific  atherosclerosis resulting in less than 50% stenosis by NASCET criteria. Patent internal carotid artery. LEFT CAROTID SYSTEM: Common carotid artery is patent. Severe calcific atherosclerosis resulting in less than 50% stenosis by NASCET criteria. Patent internal carotid artery. VERTEBRAL ARTERIES:Left vertebral artery is dominant. Normal appearance of the vertebral arteries, widely patent. SKELETON: No acute osseous process though bone windows have not been submitted. Surgical clips LEFT axilla. OTHER NECK: Soft tissues of the neck are nonacute though, not tailored for evaluation. UPPER CHEST: Small bilateral pleural effusions. Interlobular septal thickening. Diffuse bronchial wall thickening seen with pulmonary edema, bronchitis. Mild debris distended stomach. Centrilobular emphysema. CTA HEAD FINDINGS: ANTERIOR CIRCULATION: Patent cervical internal carotid arteries, petrous, cavernous and supra clinoid internal carotid arteries. 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 Patent anterior communicating artery. Proximal RIGHT M1 occlusion. Good collaterals by single-phase CTA. Patent anterior a LEFT nd middle cerebral arteries, mild luminal irregularity compatible with atherosclerosis. No large vessel occlusion, significant stenosis, contrast extravasation. POSTERIOR CIRCULATION: Patent vertebral arteries, vertebrobasilar junction and basilar artery, as well as main branch vessels. Patent posterior cerebral arteries. No large vessel occlusion, significant stenosis, contrast extravasation or aneurysm.  VENOUS SINUSES: Major dural venous sinuses are patent though not tailored for evaluation on this angiographic examination. ANATOMIC VARIANTS: Supernumerary anterior cerebral artery. Hypoplastic RIGHT A1 segment. DELAYED PHASE: Not performed. MIP images reviewed. CT Brain Perfusion Findings: CBF (<30%) Volume: 58mL Perfusion (Tmax>6.0s) volume: 125mL Mismatch Volume: 132mL Infarction Location:RIGHT  frontotemporal parietal lobes. IMPRESSION: CTA NECK: 1. No hemodynamically significant stenosis ICA's. Patent vertebral arteries. 2. Small pleural effusions and findings of pulmonary edema. CTA HEAD: 1. Emergent RIGHT M1 occlusion. Good collaterals by single-phase CTA. 2. 3 mm RIGHT paraophthalmic artery aneurysm. Neuro-Interventional Radiology consultation is suggested to evaluate the appropriateness of potential treatment. Non-emergent evaluation can be arranged by calling [phone] during usual hours. Emergency evaluation can be requested by paging [phone]. CT PERFUSION: 1. RIGHT MCA penumbra. Acute findings discussed with and reconfirmed by Dr.MCNEILL [facility] on 02/28/2018 at 3:50 am. Aortic Atherosclerosis (ICD10-I70.0). Emphysema (ICD10-J43.9). Electronically Signed   By: Elon Alas M.D.   On: 02/28/2018 03:50   Ct Cerebral Perfusion W Contrast  Result Date: 02/28/2018 CLINICAL DATA:  Slurred speech, weakness. EXAM: CT ANGIOGRAPHY HEAD AND NECK CT PERFUSION BRAIN TECHNIQUE: Multidetector CT imaging of the head and neck was performed using the standard protocol during bolus administration of intravenous contrast. Multiplanar CT image reconstructions and MIPs were obtained to evaluate the vascular anatomy. Carotid stenosis measurements (when applicable) are obtained utilizing NASCET criteria, using the distal internal carotid diameter as the denominator. Multiphase CT imaging of the brain was performed following IV bolus contrast injection. Subsequent parametric perfusion maps were calculated using RAPID software. CONTRAST:  161mL ISOVUE-370 IOPAMIDOL (ISOVUE-370) INJECTION 76% COMPARISON:  CT HEAD February 28, 2018. FINDINGS: CTA NECK FINDINGS: AORTIC ARCH: Normal appearance of the thoracic arch, normal branch pattern. Moderate calcific atherosclerosis aortic arch. The origins of the innominate, left Common carotid artery and subclavian artery are widely patent. RIGHT CAROTID SYSTEM:  Common carotid artery is patent. Moderate calcific atherosclerosis resulting in less than 50% stenosis by NASCET criteria. Patent internal carotid artery. LEFT CAROTID SYSTEM: Common carotid artery is patent. Severe calcific atherosclerosis resulting in less than 50% stenosis by NASCET criteria. Patent internal carotid artery. VERTEBRAL ARTERIES:Left vertebral artery is dominant. Normal appearance of the vertebral arteries, widely patent. SKELETON: No acute osseous process though bone windows have not been submitted. Surgical clips LEFT axilla. OTHER NECK: Soft tissues of the neck are nonacute though, not tailored for evaluation. UPPER CHEST: Small bilateral pleural effusions. Interlobular septal thickening. Diffuse bronchial wall thickening seen with pulmonary edema, bronchitis. Mild debris distended stomach. Centrilobular emphysema. CTA HEAD FINDINGS: ANTERIOR CIRCULATION: Patent cervical internal carotid arteries, petrous, cavernous and supra clinoid internal carotid arteries. 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 Patent anterior communicating artery. Proximal RIGHT M1 occlusion. Good collaterals by single-phase CTA. Patent anterior a LEFT nd middle cerebral arteries, mild luminal irregularity compatible with atherosclerosis. No large vessel occlusion, significant stenosis, contrast extravasation. POSTERIOR CIRCULATION: Patent vertebral arteries, vertebrobasilar junction and basilar artery, as well as main branch vessels. Patent posterior cerebral arteries. No large vessel occlusion, significant stenosis, contrast extravasation or aneurysm. VENOUS SINUSES: Major dural venous sinuses are patent though not tailored for evaluation on this angiographic examination. ANATOMIC VARIANTS: Supernumerary anterior cerebral artery. Hypoplastic RIGHT A1 segment. DELAYED PHASE: Not performed. MIP images reviewed. CT Brain Perfusion Findings: CBF (<30%) Volume: 44mL Perfusion (Tmax>6.0s) volume: 196mL  Mismatch Volume: 158mL Infarction Location:RIGHT frontotemporal parietal lobes. IMPRESSION: CTA NECK: 1. No hemodynamically significant stenosis ICA's. Patent vertebral arteries. 2. Small pleural effusions and findings of pulmonary edema.  CTA HEAD: 1. Emergent RIGHT M1 occlusion. Good collaterals by single-phase CTA. 2. 3 mm RIGHT paraophthalmic artery aneurysm. Neuro-Interventional Radiology consultation is suggested to evaluate the appropriateness of potential treatment. Non-emergent evaluation can be arranged by calling [phone] during usual hours. Emergency evaluation can be requested by paging [phone]. CT PERFUSION: 1. RIGHT MCA penumbra. Acute findings discussed with and reconfirmed by Dr.MCNEILL [facility] on 02/28/2018 at 3:50 am. Aortic Atherosclerosis (ICD10-I70.0). Emphysema (ICD10-J43.9). Electronically Signed   By: Elon Alas M.D.   On: 02/28/2018 03:50   Ct Head Code Stroke Wo Contrast  Result Date: 02/28/2018 CLINICAL DATA:  Code stroke. Aphasic. History of breast cancer, hypertension. EXAM: CT HEAD WITHOUT CONTRAST TECHNIQUE: Contiguous axial images were obtained from the base of the skull through the vertex without intravenous contrast. COMPARISON:  None. FINDINGS: BRAIN: No intraparenchymal hemorrhage, mass effect nor midline shift. The ventricles and sulci are normal for age. Minimal supratentorial white matter hypodensities less than expected for patient's age, though non-specific are most compatible with chronic small vessel ischemic disease. Focal blurring of the LEFT occipital gray-white matter junction. No abnormal extra-axial fluid collections. Basal cisterns are patent. VASCULAR: Mild calcific atherosclerosis of the carotid siphons. SKULL: No skull fracture. 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 No significant scalp soft tissue swelling. SINUSES/ORBITS: Trace paranasal sinus mucosal thickening. Small RIGHT mastoid effusion without air cell  coalescence.The included ocular globes and orbital contents are non-suspicious. Status post RIGHT ocular lens implant. OTHER: None. ASPECTS [facility] Stroke Program Early CT Score) - Ganglionic level infarction (caudate, lentiform nuclei, internal capsule, insula, M1-M3 cortex): 7 - Supraganglionic infarction (M4-M6 cortex): 3 Total score (0-10 with 10 being normal): 10 IMPRESSION: 1. No acute intracranial process. 2. ASPECTS is 10. 3. Small LEFT occipital lobe infarct, likely chronic. Otherwise negative CT HEAD without contrast for age. 4. Critical Value/emergent results text paged to [place], Neurology via [employer] secure system on 02/28/2018 at 3:23 am, including interpreting physician's phone number. Electronically Signed   By: Elon Alas M.D.   On: 02/28/2018 03:24    Procedures Procedures (including critical care time)  CRITICAL CARE Performed by: Merrily Pew Total critical care time: 35 minutes Critical care time was exclusive of separately billable procedures and treating other patients. Critical care was necessary to treat or prevent imminent or life-threatening deterioration. Critical care was time spent personally by me on the following activities: development of treatment plan with patient and/or surrogate as well as nursing, discussions with consultants, evaluation of patient's response to treatment, examination of patient, obtaining history from patient or surrogate, ordering and performing treatments and interventions, ordering and review of laboratory studies, ordering and review of radiographic studies, pulse oximetry and re-evaluation of patient's condition.   Medications Ordered in ED Medications  iopamidol (ISOVUE-370) 76 % injection 100 mL (100 mLs Intravenous Contrast Given 02/28/18 0326)     Initial Impression / Assessment and Plan / ED Course  I have reviewed the triage vital signs and the nursing notes.  Pertinent labs & imaging results that were available  during my care of the patient were reviewed by me and considered in my medical decision making (see chart for details).  Code stroke with left sided deficits. CT without bleed.   Outside of window for tPA.  Neuro to take to IR for intervention.    Final Clinical Impressions(s) / ED Diagnoses   Final diagnoses:  Acute ischemic stroke [facility])    ED Discharge Orders    None  Jae Skeet, Corene Cornea, MD 02/28/18 [phone]

## 2018-02-28 NOTE — Transfer of Care (Signed)
Immediate Anesthesia Transfer of Care Note  Patient: Haley Costa  Procedure(s) Performed: RADIOLOGY WITH ANESTHESIA (N/A )  Patient Location: ICU  Anesthesia Type:General  Level of Consciousness: Patient remains intubated per anesthesia plan  Airway & Oxygen Therapy: Patient remains intubated per anesthesia plan and Patient placed on Ventilator (see vital sign flow sheet for setting)  Post-op Assessment: Report given to RN and Post -op Vital signs reviewed and stable  Post vital signs: Reviewed and stable  Last Vitals:  Vitals Value Taken Time  BP 138/93 02/28/2018  6:33 AM  Temp    Pulse    Resp 14 02/28/2018  6:40 AM  SpO2    Vitals shown include unvalidated device data.  Last Pain: There were no vitals filed for this visit.       Complications: No apparent anesthesia complications

## 2018-02-28 NOTE — ED Triage Notes (Signed)
Pt BIB [facility], LSN 12/13 @ 2200, found by son this am. Pt's left arm/leg weakness, left sided facial droop, and slurred speech. Alert, answering questions appropriately. SpO2 88% on room air, placed on NRB with improvement.

## 2018-02-28 NOTE — Consult Note (Signed)
NAME:  Haley Costa, MRN:  [id number], DOB:  06/20/1928, LOS: 0 ADMISSION DATE:  02/28/2018, CONSULTATION DATE:  02/28/2018 REFERRING MD:  Dr. Leonel Ramsay, CHIEF COMPLAINT:  stroke  Brief History   84 yoF presenting with acute left sided weakness found to have acute right M1 occlusion s/p EVR with successful mechanical thrombectomy and revascularization.  Returns to ICU vented.  History of present illness   HPI obtained from medical chart review as patient is sedated on mechanical ventilation.  84 year old female with PMH of systolic /diastolic HF (EF 17-51%, W2HE, mod MR, PAP 37 mmHg on 12/09/2017), murmur, HLD, HTN, LBBB, osteoarthritis, breast cancer presenting with acute left sided weakness.  Patient at baseline is short of breath but is able to perform her ADLs independently. LSW around 2200 when patient went to bed. Patient apparently woke up and was unable to roll over.  She called her son and family found with with left sided weakness.  Presented as code stroke.  CT head negative. Outside of window for TPA.  CTA head/neck showed acute right M1 occlusion.  Patient intubated and taken to IR with successful revascularization.  Patient returns to ICU sedated and intubated on mechanical ventilation.  PCCM consulted for further vent and medical management.   Past Medical History  Systolic /diastolic HF (EF 52-77%, O2UM, mod MR, PAP 37 mmHg), murmur, HLD, HTN, LBBB, osteoarthritis, breast cancer  Significant Hospital Events   12/14 Admitted   Consults:  Neuro IR PCCM   Procedures:  12/14 cerebral angiogram s/p EVR  Significant Diagnostic Tests:  12/14 CTH >> 1. No acute intracranial process. 2. ASPECTS is 10. 3. Small LEFT occipital lobe infarct, likely chronic. Otherwise negative CT HEAD without contrast for age.  12/14 CTA head/ neck/ perfusion >> No hemodynamically significant stenosis ICA's. Patent vertebral arteries. Small pleural effusions and findings of pulmonary  edema Emergent RIGHT M1 occlusion. Good collaterals by single-phase CTA. 3 mm RIGHT paraophthalmic artery aneurysm. Right MCA penumbra  Micro Data:  12/14 MRSA PCR >>  Antimicrobials:  12/14 ancef preop  Interim history/subjective:   Objective   Blood pressure (!) 147/87, pulse 85, resp. rate (!) 25, SpO2 95 %.     Intake/Output Summary (Last 24 hours) at 02/28/2018 0645 Last data filed at 02/28/2018 0600 Gross per 24 hour  Intake -  Output 150 ml  Net -150 ml   There were no vitals filed for this visit.  Examination: General:  Elderly WF sedated/paralyzed on MV HEENT: MM pink/moist, pupils 4/brisk, ETT 7 at 21, OGT Neuro: sedated/ paralyzed CV: irir, murmur, doppler distal pulses, right groin soft, dressing CDI PULM: even/non-labored, lungs bilaterally rhonchi, not breathing over set rate GI: soft, +bs Extremities: warm/dry, trace LE edema  Skin: no rashes, prior left mastectomy   Resolved Hospital Problem list    Assessment & Plan:  Acute R M1 occlusion s/p EVR with successful revascularization P:  Per Neuro and Neuro IR Hourly neuro checks Wake up assessment when paralytics wear off cleviprex for SBP goal 120-140  Continue aline  Daily asa  PAD protocol with propofol and PRN fentanyl for RASS goal 0/-1 w/bowel regimen Further imaging per Neuro  Acute respiratory insufficiency - CXR reviewed- ETT position ok, pulm edema, OGT noted in left lung (since removed) P:  Full MV support, PRVC 8 cc/kg, rate 14 Wean FiO2/ peep as able for goal sat > 94% ABG now VAP measures Daily SBT if meets criteria   PRN nebs  Afib - new  onset, not noted on prior EKG P:  Tele monitoring  Currently rate controlled  CHADS vas score 7, will need coagulation, defer timing to Neuro Consider cardiology consult  Acute hypertension Hx systolic/diastolic HF (EF 90-38%, B3XO, mod MR, PAP 37 mmHg) , HLD, mod MR P:  cleviprex as above Hold lasix given slight AKI and dye  load Resume home coreg, crestor Pending lipid panel TTE ordered  Mild AKI P:  Gentle hydration, NS 51ml /hr for now Trend UOP/ BMET/ mag/ phos  Hyperglycemia P:  CBG q 4 SSI sensitive Check HgbA1c  Mild Transaminitis - normal coags P:  Monitor, repeat LFTs in am   Best practice:  Diet: NPO Pain/Anxiety/Delirium protocol (if indicated): propofol / prn fentanyl VAP protocol (if indicated): yes DVT prophylaxis: SCDs GI prophylaxis: pepcid Glucose control: SSI Mobility: BR Code Status: Full but will need to clarify with family as patient was prior DNR   Family Communication: no family at bedside Disposition: ICU  Labs   CBC: Recent Labs  Lab 02/28/18 0303 02/28/18 0305  WBC 8.0  --   NEUTROABS 5.2  --   HGB 12.8 13.3  HCT 40.4 39.0  MCV 97.6  --   PLT 227  --     Basic Metabolic Panel: Recent Labs  Lab 02/28/18 0303 02/28/18 0305  NA 134* 134*  K 4.0 4.0  CL 97* 99  CO2 21*  --   GLUCOSE 191* 187*  BUN 25* 26*  CREATININE 1.18* 1.00  CALCIUM 9.2  --    GFR: CrCl cannot be calculated (Unknown ideal weight.). Recent Labs  Lab 02/28/18 0303  WBC 8.0    Liver Function Tests: Recent Labs  Lab 02/28/18 0303  AST 54*  ALT 57*  ALKPHOS 92  BILITOT 1.8*  PROT 6.3*  ALBUMIN 3.5   No results for input(s): LIPASE, AMYLASE in the last 168 hours. No results for input(s): AMMONIA in the last 168 hours.  ABG    Component Value Date/Time   TCO2 24 02/28/2018 0305     Coagulation Profile: Recent Labs  Lab 02/28/18 0303  INR 1.02    Cardiac Enzymes: No results for input(s): CKTOTAL, CKMB, CKMBINDEX, TROPONINI in the last 168 hours.  HbA1C: Hgb A1c MFr Bld  Date/Time Value Ref Range Status  11/21/2017 04:38 AM 5.3 4.8 - 5.6 % Final    Comment:    (NOTE) Pre diabetes:          5.7%-6.4% Diabetes:              >6.4% Glycemic control for   <7.0% adults with diabetes     CBG: No results for input(s): GLUCAP in the last 168  hours.  Review of Systems:   unable  Past Medical History  She,  has a past medical history of Breast cancer, left breast ([place]) (1992), CHF (congestive heart failure) ([place]), Chronic back pain, Heart murmur, High cholesterol, History of kidney stones, Hypertension, LBBB (left bundle branch block), Osteoarthritis, Pneumonia, and PONV (postoperative nausea and vomiting).   Surgical History    Past Surgical History:  Procedure Laterality Date  . APPENDECTOMY    . BACK SURGERY    . CATARACT EXTRACTION W/ INTRAOCULAR LENS IMPLANT Right   . FIXATION KYPHOPLASTY THORACIC SPINE  2014  . MASTECTOMY Left 1992     Social History   reports that she has never smoked. She has never used smokeless tobacco. She reports that she does not drink alcohol or use drugs.  Family History   Her family history includes Cervical cancer in her mother; Hypertension in her brother.   Allergies Allergies  Allergen Reactions  . Chlorthalidone   . Lisinopril Swelling    Eye  And cheek swelling  . Persantine [Dipyridamole]      Home Medications  Prior to Admission medications   Medication Sig Start Date End Date Taking? Authorizing Provider  aspirin EC 81 MG EC tablet Take 1 tablet (81 mg total) by mouth daily. 11/26/17   [name], [name], MD  carvedilol (COREG) 6.25 MG tablet Take 1 tablet (6.25 mg total) by mouth 2 (two) times daily with a meal. 02/04/18   [name], [name], MD  furosemide (LASIX) 40 MG tablet Take 0.5 tablets (20 mg total) by mouth daily. 12/10/17   [name], [name], MD  losartan (COZAAR) 25 MG tablet Take 1 tablet (25 mg total) by mouth daily. 11/26/17   [name], [name], MD  potassium chloride 20 MEQ TBCR Take 20 mEq by mouth daily. 12/10/17   [name], [name], MD  rosuvastatin (CRESTOR) 5 MG tablet Take 1 tablet (5 mg total) by mouth daily at 6 PM. 11/25/17   Modena Jansky, MD     Critical care time: 11 mins    Kennieth Rad, [place] Pgr: [phone] or if no answer [phone] 02/28/2018, 7:50 AM  '

## 2018-02-28 NOTE — Anesthesia Preprocedure Evaluation (Addendum)
Anesthesia Evaluation  Patient identified by MRN, date of birth, ID band Patient confused    Reviewed: Allergy & Precautions, NPO status , Patient's Chart, lab work & pertinent test results, Unable to perform ROS - Chart review onlyPreop documentation limited or incomplete due to emergent nature of procedure.  History of Anesthesia Complications (+) PONV and history of anesthetic complications  Airway Mallampati: II  TM Distance: >3 FB Neck ROM: Full    Dental  (+) Teeth Intact, Dental Advisory Given   Pulmonary neg pulmonary ROS,    + rhonchi  + decreased breath sounds      Cardiovascular hypertension, Pt. on home beta blockers +CHF  + dysrhythmias (LBBB) Atrial Fibrillation + Valvular Problems/Murmurs MR  Rhythm:Irregular Rate:Abnormal  Echo 11/21/17: Normal LV size with EF 25-30%. Akinesis of the basal to mid inferior and inferoseptal walls, hypokinesis of the remainder of the left ventricle. Normal RV size and systolic function. Moderate mitral regurgitation. Mild pulmonary hypertension.   Neuro/Psych negative neurological ROS  negative psych ROS   GI/Hepatic negative GI ROS, Neg liver ROS,   Endo/Other  negative endocrine ROS  Renal/GU negative Renal ROS     Musculoskeletal  (+) Arthritis ,   Abdominal   Peds  Hematology negative hematology ROS (+)   Anesthesia Other Findings Day of surgery medications reviewed with the patient.  Breast cancer s/p mastectomy and chemo  Reproductive/Obstetrics                            Anesthesia Physical Anesthesia Plan  ASA: IV and emergent  Anesthesia Plan: General   Post-op Pain Management:    Induction: Intravenous and Rapid sequence  PONV Risk Score and Plan: 4 or greater and Treatment may vary due to age or medical condition, Dexamethasone and Ondansetron  Airway Management Planned: Oral ETT  Additional Equipment: Arterial  line  Intra-op Plan:   Post-operative Plan: Post-operative intubation/ventilation  Informed Consent: I have reviewed the patients History and Physical, chart, labs and discussed the procedure including the risks, benefits and alternatives for the proposed anesthesia with the patient or authorized representative who has indicated his/her understanding and acceptance.   Only emergency history available and History available from chart only  Plan Discussed with: CRNA  Anesthesia Plan Comments: (Emergency consent only.)       Anesthesia Quick Evaluation

## 2018-02-28 NOTE — Procedures (Addendum)
S/P RT common carotid arteriogram followed by complete revascularization of occluded RT MCA M 1 seg with x 1 pass with 85mm x 20mm embotrap retriever device achieving a TICI 3 revascularization. Approx 51mm x 6 mm  bilobed RT PCOM aneurysm.

## 2018-02-28 NOTE — Progress Notes (Signed)
eLink Physician-Brief Progress Note Patient Name: Haley Costa DOB: 01/27/1929 MRN: [id number]   Date of Service  02/28/2018  HPI/Events of Note  Hypotension - BP = 92/43. Goal SBP = 120-140.   eICU Interventions  Will order: 1. Phenylephrine IV infusion. Titrate to SBP = 120-140.      Intervention Category Major Interventions: Hypotension - evaluation and management  Lysle Dingwall 02/28/2018, 8:57 PM

## 2018-03-01 ENCOUNTER — Inpatient Hospital Stay (HOSPITAL_COMMUNITY): Payer: Medicare Other

## 2018-03-01 DIAGNOSIS — I361 Nonrheumatic tricuspid (valve) insufficiency: Secondary | ICD-10-CM

## 2018-03-01 DIAGNOSIS — I340 Nonrheumatic mitral (valve) insufficiency: Secondary | ICD-10-CM

## 2018-03-01 DIAGNOSIS — D72829 Elevated white blood cell count, unspecified: Secondary | ICD-10-CM

## 2018-03-01 DIAGNOSIS — E785 Hyperlipidemia, unspecified: Secondary | ICD-10-CM

## 2018-03-01 LAB — CBC WITH DIFFERENTIAL/PLATELET
Abs Immature Granulocytes: 0.07 10*3/uL (ref 0.00–0.07)
BASOS PCT: 0 %
Basophils Absolute: 0 10*3/uL (ref 0.0–0.1)
Eosinophils Absolute: 0 10*3/uL (ref 0.0–0.5)
Eosinophils Relative: 0 %
HCT: 32.8 % — ABNORMAL LOW (ref 36.0–46.0)
Hemoglobin: 10.6 g/dL — ABNORMAL LOW (ref 12.0–15.0)
Immature Granulocytes: 1 %
Lymphocytes Relative: 14 %
Lymphs Abs: 1.8 10*3/uL (ref 0.7–4.0)
MCH: 31.1 pg (ref 26.0–34.0)
MCHC: 32.3 g/dL (ref 30.0–36.0)
MCV: 96.2 fL (ref 80.0–100.0)
Monocytes Absolute: 1.6 10*3/uL — ABNORMAL HIGH (ref 0.1–1.0)
Monocytes Relative: 12 %
Neutro Abs: 9.3 10*3/uL — ABNORMAL HIGH (ref 1.7–7.7)
Neutrophils Relative %: 73 %
Platelets: 209 10*3/uL (ref 150–400)
RBC: 3.41 MIL/uL — AB (ref 3.87–5.11)
RDW: 14.8 % (ref 11.5–15.5)
WBC: 12.9 10*3/uL — AB (ref 4.0–10.5)
nRBC: 0 % (ref 0.0–0.2)

## 2018-03-01 LAB — BASIC METABOLIC PANEL
Anion gap: 12 (ref 5–15)
BUN: 21 mg/dL (ref 8–23)
CALCIUM: 8.5 mg/dL — AB (ref 8.9–10.3)
CO2: 22 mmol/L (ref 22–32)
Chloride: 104 mmol/L (ref 98–111)
Creatinine, Ser: 1 mg/dL (ref 0.44–1.00)
GFR calc Af Amer: 59 mL/min — ABNORMAL LOW (ref >60)
GFR calc non Af Amer: 51 mL/min — ABNORMAL LOW (ref >60)
Glucose, Bld: 105 mg/dL — ABNORMAL HIGH (ref 70–99)
Potassium: 3.7 mmol/L (ref 3.5–5.1)
SODIUM: 138 mmol/L (ref 135–145)

## 2018-03-01 LAB — GLUCOSE, CAPILLARY
GLUCOSE-CAPILLARY: 77 mg/dL (ref 70–99)
Glucose-Capillary: 93 mg/dL (ref 70–99)
Glucose-Capillary: 91 mg/dL (ref 70–99)

## 2018-03-01 LAB — HEPATIC FUNCTION PANEL
ALT: 36 U/L (ref 0–44)
AST: 28 U/L (ref 15–41)
Albumin: 2.8 g/dL — ABNORMAL LOW (ref 3.5–5.0)
Alkaline Phosphatase: 72 U/L (ref 38–126)
Bilirubin, Direct: 0.4 mg/dL — ABNORMAL HIGH (ref 0.0–0.2)
Indirect Bilirubin: 1.5 mg/dL — ABNORMAL HIGH (ref 0.3–0.9)
Total Bilirubin: 1.9 mg/dL — ABNORMAL HIGH (ref 0.3–1.2)
Total Protein: 5.2 g/dL — ABNORMAL LOW (ref 6.5–8.1)

## 2018-03-01 LAB — ECHOCARDIOGRAM COMPLETE
Height: 62 in
WEIGHTICAEL: 2197.55 [oz_av]

## 2018-03-01 LAB — PHOSPHORUS: Phosphorus: 4.1 mg/dL (ref 2.5–4.6)

## 2018-03-01 MED ORDER — FUROSEMIDE 10 MG/ML IJ SOLN
20.0000 mg | Freq: Once | INTRAMUSCULAR | Status: AC
  Administered 2018-03-01: 20 mg via INTRAVENOUS
  Filled 2018-03-01: qty 2

## 2018-03-01 MED ORDER — WHITE PETROLATUM EX OINT
TOPICAL_OINTMENT | CUTANEOUS | Status: AC
  Administered 2018-03-01: 1 via TOPICAL
  Filled 2018-03-01: qty 28.35

## 2018-03-01 MED ORDER — INSULIN ASPART 100 UNIT/ML ~~LOC~~ SOLN: 0.0000 [IU] | Freq: Three times a day (TID) | SUBCUTANEOUS | Status: DC

## 2018-03-01 MED ORDER — CHLORHEXIDINE GLUCONATE 0.12 % MT SOLN
15.0000 mL | Freq: Two times a day (BID) | OROMUCOSAL | Status: DC
  Administered 2018-03-01 – 2018-03-03 (×4): 15 mL via OROMUCOSAL
  Filled 2018-03-01 (×2): qty 15

## 2018-03-01 MED ORDER — ORAL CARE MOUTH RINSE
15.0000 mL | Freq: Two times a day (BID) | OROMUCOSAL | Status: DC
  Administered 2018-03-02 – 2018-03-03 (×3): 15 mL via OROMUCOSAL

## 2018-03-01 MED ORDER — PANTOPRAZOLE SODIUM 40 MG PO TBEC
40.0000 mg | DELAYED_RELEASE_TABLET | Freq: Every day | ORAL | Status: DC
  Administered 2018-03-02 – 2018-03-03 (×2): 40 mg via ORAL
  Filled 2018-03-01 (×2): qty 1

## 2018-03-01 MED ORDER — CHLORHEXIDINE GLUCONATE 0.12 % MT SOLN
OROMUCOSAL | Status: AC
  Administered 2018-03-01: 15 mL
  Filled 2018-03-01: qty 15

## 2018-03-01 NOTE — Progress Notes (Signed)
STROKE TEAM PROGRESS NOTE   SUBJECTIVE (INTERVAL HISTORY) Her son is at the bedside.  Patient is extubated and tolerating well, still has low voice but no aphasia. She has afib on tele and EKG, will need to start DOAC at day 3. MRI showed right BG and periventricular small infarcts. Still on neo, will adjust BP parameters. Has not had swallow eval yet.    OBJECTIVE Vitals:   03/01/18 0730 03/01/18 0800 03/01/18 0911 03/01/18 0934  BP: (!) 134/56 (!) 134/56 135/63 133/63  Pulse: 78 80 87 87  Resp: 14 14 17 20   Temp:  98.6 F (37 C)    TempSrc:      SpO2: 100% 100% 99% 99%  Weight:      Height:        CBC:  Recent Labs  Lab 02/28/18 0303 02/28/18 0305 03/01/18 0158  WBC 8.0  --  12.9*  NEUTROABS 5.2  --  9.3*  HGB 12.8 13.3 10.6*  HCT 40.4 39.0 32.8*  MCV 97.6  --  96.2  PLT 227  --  962    Basic Metabolic Panel:  Recent Labs  Lab 02/28/18 0303 02/28/18 0305 02/28/18 1010 03/01/18 0158  NA 134* 134*  --  138  K 4.0 4.0  --  3.7  CL 97* 99  --  104  CO2 21*  --   --  22  GLUCOSE 191* 187*  --  105*  BUN 25* 26*  --  21  CREATININE 1.18* 1.00  --  1.00  CALCIUM 9.2  --   --  8.5*  MG  --   --  2.0  --   PHOS  --   --   --  4.1    Lipid Panel:     Component Value Date/Time   CHOL 174 02/28/2018 0303   TRIG 57 02/28/2018 1010   HDL 60 02/28/2018 0303   CHOLHDL 2.9 02/28/2018 0303   VLDL 16 02/28/2018 0303   LDLCALC 98 02/28/2018 0303   HgbA1c:  Lab Results  Component Value Date   HGBA1C 5.3 02/28/2018   Urine Drug Screen: No results found for: LABOPIA, COCAINSCRNUR, LABBENZ, AMPHETMU, THCU, LABBARB  Alcohol Level     Component Value Date/Time   ETH <10 02/28/2018 0303    IMAGING  Ct Angio Head W Or Wo Contrast Ct Angio Neck W Or Wo Contrast Ct Cerebral Perfusion W Contrast 02/28/2018 IMPRESSION:   CTA NECK:  1. No hemodynamically significant stenosis ICA's. Patent vertebral arteries.  2. Small pleural effusions and findings of pulmonary  edema.   CTA HEAD:  1. Emergent RIGHT M1 occlusion. Good collaterals by single-phase CTA.  2. 3 mm RIGHT paraophthalmic artery aneurysm. Neuro-Interventional Radiology consultation is suggested to evaluate the appropriateness of potential treatment.   CT PERFUSION:  1. RIGHT MCA penumbra.  Aortic Atherosclerosis (ICD10-I70.0).  Emphysema (ICD10-J43.9).    Ct Head Code Stroke Wo Contrast 02/28/2018 IMPRESSION:  1. No acute intracranial process.  2. ASPECTS is 10.  3. Small LEFT occipital lobe infarct, likely chronic.   Otherwise negative CT HEAD without contrast for age.   DSA  S/P RT common carotid arteriogram followed by complete revascularization of occluded RT MCA M 1 seg with x 1 pass with 42mm x 44mm embotrap retriever device achieving a TICI 3 revascularization. Approx 41mm x 6 mm  bilobed RT PCOM aneurysm.  Mr Jodene Nam Head Wo Contrast  Result Date: 02/28/2018 CLINICAL DATA:  Acute RIGHT M1 occlusion. Slurred speech with  LEFT-sided weakness. TICI3 revascularization. EXAM: MRI HEAD WITHOUT CONTRAST MRA HEAD WITHOUT CONTRAST TECHNIQUE: Multiplanar, multiecho pulse sequences of the brain and surrounding structures were obtained without intravenous contrast. Angiographic images of the head were obtained using MRA technique without contrast. COMPARISON:  CT head 02/28/2018. CTA head neck 02/28/2018. CT perfusion 02/28/2018. postprocedural CT on the angiographic table, 02/28/2018. FINDINGS: MRI HEAD FINDINGS Brain: Patchy areas of restricted diffusion RIGHT centrum semiovale periventricular white matter, also involving the RIGHT putamen, corresponding low ADC, relatively minor compared with the large penumbra on prior CT perfusion, consistent with acute RIGHT MCA territory infarction. No acute parenchymal hemorrhage, mass lesion, hydrocephalus, or extra-axial fluid. Faint increased/asymmetric FLAIR signal in the subarachnoid space, posterior sylvian fissure on the RIGHT and over the convexity,  corresponds to subarachnoid contrast extravasation on postprocedural CT, consistent with small volume subarachnoid hemorrhage. Vascular: Reported separately. Skull and upper cervical spine: Normal marrow signal. Sinuses/Orbits: No acute findings.  RIGHT cataract extraction. Other: None. MRA HEAD FINDINGS Internal carotid arteries are widely patent. Basilar artery widely patent with LEFT vertebral dominant. Previous RIGHT M1 occlusion has been revascularized, with excellent and symmetric M2 and M3 opacification. Bilobed RIGHT PCOM/paraophthalmic aneurysm, 4 x 6 mm, inferiorly and posteriorly projecting, unchanged compared to prior imaging. IMPRESSION: Small areas of restricted diffusion affecting the RIGHT putamen and RIGHT periventricular white matter, much smaller than the previously identified penumbra, consistent with acute RIGHT MCA territory infarction. These are nonhemorrhagic. Small volume subarachnoid hemorrhage in the RIGHT posterior sylvian fissure and over the convexity, [place] 3C class of postprocedural intracranial hemorrhage. Bilobed RIGHT PCOM/paraophthalmic aneurysm, 4 x 6 mm, stable. Electronically Signed   By: Staci Righter M.D.   On: 02/28/2018 15:35   Mr Brain Wo Contrast  Result Date: 02/28/2018 CLINICAL DATA:  Acute RIGHT M1 occlusion. Slurred speech with LEFT-sided weakness. TICI3 revascularization. EXAM: MRI HEAD WITHOUT CONTRAST MRA HEAD WITHOUT CONTRAST TECHNIQUE: Multiplanar, multiecho pulse sequences of the brain and surrounding structures were obtained without intravenous contrast. Angiographic images of the head were obtained using MRA technique without contrast. COMPARISON:  CT head 02/28/2018. CTA head neck 02/28/2018. CT perfusion 02/28/2018. postprocedural CT on the angiographic table, 02/28/2018. FINDINGS: MRI HEAD FINDINGS Brain: Patchy areas of restricted diffusion RIGHT centrum semiovale periventricular white matter, also involving the RIGHT putamen, corresponding low  ADC, relatively minor compared with the large penumbra on prior CT perfusion, consistent with acute RIGHT MCA territory infarction. No acute parenchymal hemorrhage, mass lesion, hydrocephalus, or extra-axial fluid. Faint increased/asymmetric FLAIR signal in the subarachnoid space, posterior sylvian fissure on the RIGHT and over the convexity, corresponds to subarachnoid contrast extravasation on postprocedural CT, consistent with small volume subarachnoid hemorrhage. Vascular: Reported separately. Skull and upper cervical spine: Normal marrow signal. Sinuses/Orbits: No acute findings.  RIGHT cataract extraction. Other: None. MRA HEAD FINDINGS Internal carotid arteries are widely patent. Basilar artery widely patent with LEFT vertebral dominant. Previous RIGHT M1 occlusion has been revascularized, with excellent and symmetric M2 and M3 opacification. Bilobed RIGHT PCOM/paraophthalmic aneurysm, 4 x 6 mm, inferiorly and posteriorly projecting, unchanged compared to prior imaging. IMPRESSION: Small areas of restricted diffusion affecting the RIGHT putamen and RIGHT periventricular white matter, much smaller than the previously identified penumbra, consistent with acute RIGHT MCA territory infarction. These are nonhemorrhagic. Small volume subarachnoid hemorrhage in the RIGHT posterior sylvian fissure and over the convexity, [place] 3C class of postprocedural intracranial hemorrhage. Bilobed RIGHT PCOM/paraophthalmic aneurysm, 4 x 6 mm, stable. Electronically Signed   By: Staci Righter M.D.   On:  02/28/2018 15:35    PHYSICAL EXAM  Temp:  [97 F (36.1 C)-100 F (37.8 C)] 98.6 F (37 C) (12/15 0800) Pulse Rate:  [71-111] 87 (12/15 0934) Resp:  [14-25] 20 (12/15 0934) BP: (75-135)/(43-87) 133/63 (12/15 0934) SpO2:  [95 %-100 %] 99 % (12/15 0934) Arterial Line BP: (88-139)/(37-74) 139/60 (12/15 0800) FiO2 (%):  [40 %-50 %] 40 % (12/15 0911) Weight:  [62.3 kg] 62.3 kg (12/14 1247)  General - Well  nourished, well developed, not in distress.  Ophthalmologic - fundi not visualized due to noncooperation.  Cardiovascular - irregularly irregular heart rate and rhythm.  Neuro - extubated, awake, alert, orientated x 3. No aphasia, but hypophonia after extubation. Follow all simple commands. Eyes middle position, EOMI, PERRL, visual field full, facial symmetric.  Tongue midline in mouth.  Bilateral upper extremity 4/5, bilateral lower extremity 3/5 proximal, LLE distal 4/5 and RLE distal 5/5.  DTR 1+, no Babinski. Sensation symmetrical, coordination intact and gait not tested.   ASSESSMENT/PLAN Haley Costa is a 84 y.o. female with history of CHF with EF 25 to 30%, HTN, HLD, LBBB and left breast cancer status post surgery and chemotherapy presenting with Lt sided weakness.  She did not receive IV t-PA due to late presentation.  Stroke:  Rt MCA small infarcts at right BG and periventricular due to right M1 occlusion status post IR with TICI3 reperfusion, embolic, likely due to severe CHF and newly diagnosed A. fib  Resultant intubated with left leg weakness  CT head - No acute intracranial process. Small LEFT occipital lobe infarct, likely chronic.  CTA H&N - Emergent RIGHT M1 occlusion.  CTP positive for penumbra  DSA - S/P revascularization of occluded RT MCA M 1 achieving a TICI 3 revascularization.  MRI head - right BG and periventricular small infarct. Small post procedural right sylvian fissure SAH.   MRA head - bilobed PCOM aneurysm 4x7mm  2D Echo - pending  LDL - 98  HgbA1c - 5.3  VTE prophylaxis - SCDs  Diet - NPO  aspirin 81 mg daily prior to admission, now on aspirin 325 mg daily. Will start [place] at day 3 (03/03/18) given small stroke and post procedural SAH.   Patient counseled to be compliant with her antithrombotic medications  Ongoing aggressive stroke risk factor management  Therapy recommendations:  pending  Disposition:  Pending  Afib, new  diagnosis  Found on telemetry monitoring this morning  EKG showed A. fib episode  On Coreg 6.25  On ASA 325  Will start DOAC at day 3 (03/03/18) given small stroke and post procedural SAH.  CHF  TTE pending  TTE in 11/2016 showed EF 25 to 30%  Following with cardiology on Coreg and losartan  Coreg resumed  CCM on board  Respiratory failure, resolved  Intubated for procedure, now extubated  Tolerating well  Still on neo  Wean off as able   CCM on board  Hypertension  Stable . BP goal 100-160 . On Coreg . Off Cleviprex . On neo . Wean off as able . Long-term BP goal normotensive  Hyperlipidemia  Lipid lowering medication PTA:  Crestor 5 mg daily  LDL 98, goal < 70  Current lipid lowering medication: Crestor 5 mg daily  Continue statin at discharge  PCOM aneurysm   DSA showed 68mm x 6 mm  bilobed RT PCOM aneurysm  MRA showed 4x19mm bilobed right PCOM aneurysm  Incidental finding  Follow-up with Dr. [name] as outpt  Other Stroke Risk Factors  Advanced age  Hx stroke/TIA - by imaging   Other Active Problems  Leukocytosis 8.0->12.9    Hospital day # 1  This patient is critically ill due to right MCA infarct, CHF, right MCA occlusion status post mechanical thrombectomy, A. fib, cerebral aneurysm and at significant risk of neurological worsening, death form recurrent stroke, hemorrhagic conversion, heart failure, seizure. This patient's care requires constant monitoring of vital signs, hemodynamics, respiratory and cardiac monitoring, review of multiple databases, neurological assessment, discussion with family, other specialists and medical decision making of high complexity. I spent 35 minutes of neurocritical care time in the care of this patient.  I also discussed with CCM NP.  Rosalin Hawking, MD PhD Stroke Neurology 03/01/2018 2:20 PM      To contact Stroke Continuity provider, please refer to [email or web address] After hours, contact General  Neurology

## 2018-03-01 NOTE — Progress Notes (Signed)
eLink Physician-Brief Progress Note Patient Name: Haley Costa DOB: 10-25-1928 MRN: [id number]   Date of Service  03/01/2018  HPI/Events of Note  Oliguria - History of heart failure with LVEF = 25-30%. Nurse states that she has "crackles at the bases" bilateral on lung exam.   eICU Interventions  Will order: 1. Lasix 20 mg IV X 1 now.      Intervention Category Intermediate Interventions: Oliguria - evaluation and management  Sommer,Steven Eugene 03/01/2018, 8:23 PM

## 2018-03-01 NOTE — Anesthesia Postprocedure Evaluation (Signed)
Anesthesia Post Note  Patient: Haley Costa  Procedure(s) Performed: RADIOLOGY WITH ANESTHESIA (N/A )     Patient location during evaluation: SICU Anesthesia Type: General Level of consciousness: sedated and patient remains intubated per anesthesia plan Pain management: pain level controlled Vital Signs Assessment: post-procedure vital signs reviewed and stable Respiratory status: patient remains intubated per anesthesia plan Cardiovascular status: stable Postop Assessment: no apparent nausea or vomiting Anesthetic complications: no    Last Vitals:  Vitals:   03/01/18 0545 03/01/18 0600  BP:  106/64  Pulse: 80 80  Resp: 14 14  Temp: 37.3 C 37.3 C  SpO2: 100% 100%    Last Pain:  Vitals:   03/01/18 0000  TempSrc: Bladder                 Catalina Gravel

## 2018-03-01 NOTE — Progress Notes (Signed)
PT Cancellation Note  Patient Details Name: Haley Costa MRN: [id number] DOB: Jul 17, 1928   Cancelled Treatment:    Reason Eval/Treat Not Completed: Patient not medically ready;Active bedrest order(weaning on vent at this time)   Duncan Dull 03/01/2018, 9:13 AM Alben Deeds, PT DPT  Board Certified Neurologic Specialist [place] Pager [phone] Office [phone]

## 2018-03-01 NOTE — Progress Notes (Signed)
  Echocardiogram 2D Echocardiogram has been performed.  Haley Costa 03/01/2018, 11:10 AM

## 2018-03-01 NOTE — Evaluation (Signed)
Clinical/Bedside Swallow Evaluation Patient Details  Name: Haley Costa MRN: [id number] Date of Birth: [birth year]  Today's Date: 03/01/2018 Time: SLP Start Time (ACUTE ONLY): 66 SLP Stop Time (ACUTE ONLY): 1612 SLP Time Calculation (min) (ACUTE ONLY): 16 min  Past Medical History:  Past Medical History:  Diagnosis Date  . Breast cancer, left breast ([place]) 1992   S/P mastectomy & chemo  . CHF (congestive heart failure) ([place])   . Chronic back pain    "lower and middle back; I've had several broken vertebrae" (12/09/2017)  . Heart murmur   . High cholesterol   . History of kidney stones   . Hypertension   . LBBB (left bundle branch block)    Patient states that she was told by her PCP that she has left bundle blockage  . Osteoarthritis    "some of the joints" (12/09/2017)  . Pneumonia    "when I was a child" (12/09/2017)  . PONV (postoperative nausea and vomiting)    Past Surgical History:  Past Surgical History:  Procedure Laterality Date  . APPENDECTOMY    . BACK SURGERY    . CATARACT EXTRACTION W/ INTRAOCULAR LENS IMPLANT Right   . FIXATION KYPHOPLASTY THORACIC SPINE  2014  . MASTECTOMY Left 1992   HPI:  Haley Costa is a 84 y.o. female with a history of heart failure, pna, HTN, chronic back pain who presents with left-sided weakness that started tonight. MRI Small areas of restricted diffusion affecting the RIGHT putamen and RIGHT periventricular white matter, much smaller than the previously identified penumbra, consistent with acute RIGHT MCA territory infarction. Small volume subarachnoid hemorrhage in the RIGHT posterior sylvian fissure. CXR . There are bilateral pleural effusions with mild diffuse pulmonary edema   Assessment / Plan / Recommendation Clinical Impression  Failed [facility] swallow screen due to inability to fluidly consume 3 oz. Pt presents with subtle signs of pharyngeal dysphagia with recent CVA and intubation although 24 hours. Pt confirms  odonophagia and demonstrates facial grimace with effortful swallow with puree and reported globus sensation. Throat clearing post cup sips water was strong and consistent. Vocal quality hoarse and cough on command is fair. Pt would benefit from diagnostic treatment for continued trials with po to recommend safest textures/consistencies. May have limited amount of ice chips post oral care.  SLP Visit Diagnosis: Dysphagia, unspecified (R13.10)    Aspiration Risk  Mild aspiration risk    Diet Recommendation NPO;Ice chips PRN after oral care   Medication Administration: Via alternative means Postural Changes: Seated upright at 90 degrees    Other  Recommendations Oral Care Recommendations: Oral care BID   Follow up Recommendations Other (comment)(TBD)      Frequency and Duration min 2x/week  2 weeks       Prognosis        Swallow Study   General HPI: Haley Costa is a 84 y.o. female with a history of heart failure, pna, HTN, chronic back pain who presents with left-sided weakness that started tonight. MRI Small areas of restricted diffusion affecting the RIGHT putamen and RIGHT periventricular white matter, much smaller than the previously identified penumbra, consistent with acute RIGHT MCA territory infarction. Small volume subarachnoid hemorrhage in the RIGHT posterior sylvian fissure. CXR . There are bilateral pleural effusions with mild diffuse pulmonary edema Type of Study: Bedside Swallow Evaluation Previous Swallow Assessment: (none) Diet Prior to this Study: NPO Temperature Spikes Noted: Yes Respiratory Status: Nasal cannula History of Recent Intubation: Yes Length of Intubations (  days): 1 days Date extubated: 03/01/18(0930) Behavior/Cognition: Alert;Cooperative;Pleasant mood Oral Cavity Assessment: Other (comment)(lingual candidia) Oral Care Completed by SLP: Yes Oral Cavity - Dentition: Adequate natural dentition Vision: Functional for self-feeding Self-Feeding  Abilities: Able to feed self Patient Positioning: Upright in bed Baseline Vocal Quality: Hoarse Volitional Cough: Weak Volitional Swallow: Able to elicit    Oral/Motor/Sensory Function Overall Oral Motor/Sensory Function: Within functional limits   Ice Chips Ice chips: Not tested   Thin Liquid Thin Liquid: Impaired Presentation: Cup Pharyngeal  Phase Impairments: Throat Clearing - Immediate    Nectar Thick Nectar Thick Liquid: Not tested   Honey Thick Honey Thick Liquid: Not tested   Puree Puree: Impaired Presentation: Spoon;Self Fed Pharyngeal Phase Impairments: Multiple swallows   Solid     Solid: Not tested      Houston Siren 03/01/2018,4:28 PM  Orbie Pyo Colvin Caroli.Ed Risk analyst [phone] Office [phone]

## 2018-03-01 NOTE — Procedures (Signed)
Extubation Procedure Note  Patient Details:   Name: Haley Costa DOB: [birth year] MRN: [id number]   Airway Documentation:    Vent end date: 03/01/18 Vent end time: 0930   Evaluation  O2 sats: stable throughout Complications: No apparent complications Patient did tolerate procedure well. Bilateral Breath Sounds: Clear, Diminished   Yes   Positive cuff leak noted. Pt placed on [place] 3 L with humidity, no stridor noted.  Pt able to reach 625 with incentive spirometer.  Bayard Beaver 03/01/2018, 9:39 AM

## 2018-03-01 NOTE — Consult Note (Signed)
NAME:  Haley Costa, MRN:  [id number], DOB:  [birth year], LOS: 1 ADMISSION DATE:  02/28/2018, CONSULTATION DATE:  02/28/2018 REFERRING MD:  Dr. Leonel Ramsay, CHIEF COMPLAINT:  stroke  Brief History   78 yoF presenting with acute left sided weakness found to have acute right M1 occlusion s/p EVR with successful mechanical thrombectomy and revascularization.  Returns to ICU vented.  History of present illness   HPI obtained from medical chart review as patient is sedated on mechanical ventilation.  84 year old female with PMH of systolic /diastolic HF (EF 26-71%, I4PY, mod MR, PAP 37 mmHg on 12/09/2017), murmur, HLD, HTN, LBBB, osteoarthritis, breast cancer presenting with acute left sided weakness.  Patient at baseline is short of breath but is able to perform her ADLs independently. LSW around 2200 when patient went to bed. Patient apparently woke up and was unable to roll over.  She called her son and family found with with left sided weakness.  Presented as code stroke.  CT head negative. Outside of window for TPA.  CTA head/neck showed acute right M1 occlusion.  Patient intubated and taken to IR with successful revascularization.  Patient returns to ICU sedated and intubated on mechanical ventilation.  PCCM consulted for further vent and medical management.   Past Medical History  Systolic /diastolic HF (EF 09-98%, P3AS, mod MR, PAP 37 mmHg), murmur, HLD, HTN, LBBB, osteoarthritis, breast cancer  Significant Hospital Events   12/14 Admitted   Consults:  Neuro IR PCCM   Procedures:  12/14 cerebral angiogram s/p EVR  Significant Diagnostic Tests:  12/14 CTH >> 1. No acute intracranial process. 2. ASPECTS is 10. 3. Small LEFT occipital lobe infarct, likely chronic. Otherwise negative CT HEAD without contrast for age.  12/14 CTA head/ neck/ perfusion >> No hemodynamically significant stenosis ICA's. Patent vertebral arteries. Small pleural effusions and findings of pulmonary  edema Emergent RIGHT M1 occlusion. Good collaterals by single-phase CTA. 3 mm RIGHT paraophthalmic artery aneurysm. Right MCA penumbra  Micro Data:  12/14 MRSA PCR >>  Antimicrobials:  12/14 ancef preop  Interim history/subjective:  Awake and following commands POD 0 in the evening.  Hypotension related to sedation overnight corrected with phenylephrine.  Objective   Blood pressure 113/60, pulse 71, temperature 98.6 F (37 C), resp. rate 14, height 5\' 2"  (1.575 m), weight 62.3 kg, SpO2 100 %.     Intake/Output Summary (Last 24 hours) at 03/01/2018 0743 Last data filed at 03/01/2018 0700 Gross per 24 hour  Intake 1550.81 ml  Output 875 ml  Net 675.81 ml   Filed Weights   02/28/18 0639 02/28/18 0645 02/28/18 1247  Weight: 64 kg 64 kg 62.3 kg    Examination: General:  Elderly WF sedated HEENT: MM pink/moist, pupils 4/brisk, ETT 7 at 21, OGT Neuro: sedation stopped. Still somnolent but follows commands in all for symmetrically. CV: irir, murmur, doppler distal pulses, right groin soft, dressing CDI PULM: even/non-labored, lungs bilaterally rhonchi, not breathing over set rate.  Good tidal volumes but apnea on PSV still. GI: soft, +bs Extremities: warm/dry, trace LE edema  Skin: no rashes, prior left mastectomy   Resolved Hospital Problem list    Assessment & Plan:  Acute R M1 occlusion s/p EVR with successful revascularizatio Critically ill due to expected acute respiratory failure post procedure Afib - new onset, not noted on prior EKG Critically ill due to hypotension from sedation. Hx systolic/diastolic HF (EF 50-53%, Z7QB, mod MR, PAP 37 mmHg) , HLD, mod M Mild AKI resolving.  Mild Transaminitis - resolved  PLAN: Extubation if tolerates 1h SBT. Propofol stopped Wean phenylephrine to off  Monitor for recurrent hypertension and resume Cleveprex if necessary to keep SBP<140 until able to take orals. Intermittent rate controlled AF. Will need long-term  anticoagulation for stroke prevention. Echo and stroke prevention work-up pending. Stop maintenance fluids, hold furosemide for now unless shows clear signs of volume overload.  Best practice:  Diet: NPO Pain/Anxiety/Delirium protocol (if indicated): propofol / prn fentanyl VAP protocol (if indicated): yes DVT prophylaxis: SCDs GI prophylaxis: pepcid Glucose control: SSI Mobility: BR - PT and progressive ambulation once extubated.  Code Status: Full but will need to clarify with family as patient was prior DNR   Family Communication: no family at bedside Disposition: ICU  Labs   CBC: Recent Labs  Lab 02/28/18 0303 02/28/18 0305 03/01/18 0158  WBC 8.0  --  12.9*  NEUTROABS 5.2  --  9.3*  HGB 12.8 13.3 10.6*  HCT 40.4 39.0 32.8*  MCV 97.6  --  96.2  PLT 227  --  960    Basic Metabolic Panel: Recent Labs  Lab 02/28/18 0303 02/28/18 0305 02/28/18 1010 03/01/18 0158  NA 134* 134*  --  138  K 4.0 4.0  --  3.7  CL 97* 99  --  104  CO2 21*  --   --  22  GLUCOSE 191* 187*  --  105*  BUN 25* 26*  --  21  CREATININE 1.18* 1.00  --  1.00  CALCIUM 9.2  --   --  8.5*  MG  --   --  2.0  --   PHOS  --   --   --  4.1   GFR: Estimated Creatinine Clearance: 34.4 mL/min (by C-G formula based on SCr of 1 mg/dL). Recent Labs  Lab 02/28/18 0303 03/01/18 0158  WBC 8.0 12.9*    Liver Function Tests: Recent Labs  Lab 02/28/18 0303 03/01/18 0158  AST 54* 28  ALT 57* 36  ALKPHOS 92 72  BILITOT 1.8* 1.9*  PROT 6.3* 5.2*  ALBUMIN 3.5 2.8*   No results for input(s): LIPASE, AMYLASE in the last 168 hours. No results for input(s): AMMONIA in the last 168 hours.  ABG    Component Value Date/Time   PHART 7.405 02/28/2018 0814   PCO2ART 34.9 02/28/2018 0814   PO2ART 78.0 (L) 02/28/2018 0814   HCO3 22.0 02/28/2018 0814   TCO2 23 02/28/2018 0814   ACIDBASEDEF 2.0 02/28/2018 0814   O2SAT 96.0 02/28/2018 0814     Coagulation Profile: Recent Labs  Lab 02/28/18 0303  INR  1.02    Cardiac Enzymes: No results for input(s): CKTOTAL, CKMB, CKMBINDEX, TROPONINI in the last 168 hours.  HbA1C: Hgb A1c MFr Bld  Date/Time Value Ref Range Status  02/28/2018 03:03 AM 5.3 4.8 - 5.6 % Final    Comment:    (NOTE) Pre diabetes:          5.7%-6.4% Diabetes:              >6.4% Glycemic control for   <7.0% adults with diabetes   11/21/2017 04:38 AM 5.3 4.8 - 5.6 % Final    Comment:    (NOTE) Pre diabetes:          5.7%-6.4% Diabetes:              >6.4% Glycemic control for   <7.0% adults with diabetes    Lipid Panel     Component Value  Date/Time   CHOL 174 02/28/2018 0303   TRIG 57 02/28/2018 1010   HDL 60 02/28/2018 0303   CHOLHDL 2.9 02/28/2018 0303   VLDL 16 02/28/2018 0303   LDLCALC 98 02/28/2018 0303   CBG: Recent Labs  Lab 02/28/18 1613 02/28/18 1936 02/28/18 2341 03/01/18 0327 03/01/18 0740  GLUCAP 113* 112* 98 93 77     Critical care time: 45 min spent review labs and overnight events, examining patient and assessing suitability for extubation.     Kipp Brood, MD [facility] ICU Physician [place]  Pager: [phone] Mobile: [phone] After hours: [phone].  03/01/2018, 8:03 AM

## 2018-03-01 NOTE — Progress Notes (Signed)
Pt placed on wean by MD/

## 2018-03-02 ENCOUNTER — Inpatient Hospital Stay (HOSPITAL_COMMUNITY): Payer: Medicare Other

## 2018-03-02 ENCOUNTER — Other Ambulatory Visit: Payer: Self-pay

## 2018-03-02 ENCOUNTER — Encounter (HOSPITAL_COMMUNITY): Payer: Self-pay | Admitting: Interventional Radiology

## 2018-03-02 DIAGNOSIS — I5022 Chronic systolic (congestive) heart failure: Secondary | ICD-10-CM

## 2018-03-02 LAB — CBC
HEMATOCRIT: 35.3 % — AB (ref 36.0–46.0)
Hemoglobin: 11.2 g/dL — ABNORMAL LOW (ref 12.0–15.0)
MCH: 30.9 pg (ref 26.0–34.0)
MCHC: 31.7 g/dL (ref 30.0–36.0)
MCV: 97.5 fL (ref 80.0–100.0)
Platelets: 189 10*3/uL (ref 150–400)
RBC: 3.62 MIL/uL — ABNORMAL LOW (ref 3.87–5.11)
RDW: 14.6 % (ref 11.5–15.5)
WBC: 7.8 10*3/uL (ref 4.0–10.5)
nRBC: 0 % (ref 0.0–0.2)

## 2018-03-02 LAB — BASIC METABOLIC PANEL
Anion gap: 12 (ref 5–15)
BUN: 25 mg/dL — ABNORMAL HIGH (ref 8–23)
CO2: 21 mmol/L — ABNORMAL LOW (ref 22–32)
Calcium: 8.6 mg/dL — ABNORMAL LOW (ref 8.9–10.3)
Chloride: 107 mmol/L (ref 98–111)
Creatinine, Ser: 0.99 mg/dL (ref 0.44–1.00)
GFR calc Af Amer: 59 mL/min — ABNORMAL LOW (ref >60)
GFR calc non Af Amer: 51 mL/min — ABNORMAL LOW (ref >60)
Glucose, Bld: 83 mg/dL (ref 70–99)
Potassium: 3.6 mmol/L (ref 3.5–5.1)
Sodium: 140 mmol/L (ref 135–145)

## 2018-03-02 LAB — GLUCOSE, CAPILLARY: Glucose-Capillary: 89 mg/dL (ref 70–99)

## 2018-03-02 MED ORDER — FUROSEMIDE 10 MG/ML IJ SOLN: 20.0000 mg | Freq: Every day | INTRAMUSCULAR | Status: DC

## 2018-03-02 MED ORDER — FUROSEMIDE 20 MG PO TABS
20.0000 mg | ORAL_TABLET | Freq: Every day | ORAL | Status: DC
  Administered 2018-03-02 – 2018-03-03 (×2): 20 mg via ORAL
  Filled 2018-03-02 (×2): qty 1

## 2018-03-02 NOTE — Evaluation (Signed)
Occupational Therapy Evaluation Patient Details Name: Haley Costa MRN: [id number] DOB: 1928/09/27 Today's Date: 03/02/2018    History of Present Illness Haley Costa is a 84 y.o. female with a history of heart failure who presents with left-sided weakness. CTA of head reveals R MI occlusion resulting in L MCA Infarct. Pt s/p revasculariation of  occluded RT MCA. PMH: pt with new diagnosis of A.FIB.   Clinical Impression   PTA patient independent with ADLs, limited IADLs, but completes medication mgmt. Admitted for above and limited by problem list below, including L UE weakness, impaired balance, blurry vision with slight R peripheral visual impairment and difficulty scanning towards R side, and decreased activity tolerance.  She requires min guard for transfers, min assist LB ADLs, setup for UB ADLs and min guard for grooming tasks standing at sink.  Patient has very supportive family and plans to dc home with her sons support. Patient will benefit from continued OT services while admitted and after dc at [facility] level in order to optimize independence and safety with ADLs.     Follow Up Recommendations  Home health OT;Supervision/Assistance - 24 hour    Equipment Recommendations  3 in 1 bedside commode    Recommendations for Other Services       Precautions / Restrictions Precautions Precautions: Fall Precaution Comments: R eye blurriness (aneurysm of 87mm behind eye) Restrictions Weight Bearing Restrictions: No      Mobility Bed Mobility               General bed mobility comments: OOB in recliner   Transfers Overall transfer level: Needs assistance Equipment used: None Transfers: Sit to/from Stand Sit to Stand: Min guard         General transfer comment: min guard for safety and balance     Balance Overall balance assessment: Needs assistance Sitting-balance support: Feet supported;No upper extremity supported Sitting balance-Leahy Scale: Fair      Standing balance support: Bilateral upper extremity supported Standing balance-Leahy Scale: Fair Standing balance comment: reliant on UE support with RW, able to complete grooming with 0 hand support but min guard required and fatigue easily                            ADL either performed or assessed with clinical judgement   ADL Overall ADL's : Needs assistance/impaired     Grooming: Oral care;Standing;Min guard   Upper Body Bathing: Set up;Sitting   Lower Body Bathing: Sit to/from stand;Min guard   Upper Body Dressing : Set up;Sitting   Lower Body Dressing: Minimal assistance;Sit to/from stand   Toilet Transfer: Min guard;Ambulation;RW Toilet Transfer Details (indicate cue type and reason): simulated to recliner          Functional mobility during ADLs: Min guard;Rolling walker;Cueing for safety General ADL Comments: min guard for safety and balance, limited by decreased activity tolerance and L sided weakness      Vision Baseline Vision/History: Wears glasses Wears Glasses: Reading only Patient Visual Report: Other (comment);Blurring of vision(in R eye specifically) Vision Assessment?: Yes Eye Alignment: Within Functional Limits Ocular Range of Motion: Restricted on the right Alignment/Gaze Preference: Within Defined Limits Tracking/Visual Pursuits: Able to track stimulus in all quads without difficulty Visual Fields: No apparent deficits Additional Comments: patient presents with report of "blurriness" over R eye only, difficulty scanning with fully towards R side with slight peripheral vision loss (~15 degrees)     Perception  Praxis      Pertinent Vitals/Pain Pain Assessment: No/denies pain     Hand Dominance Right   Extremity/Trunk Assessment Upper Extremity Assessment Upper Extremity Assessment: Generalized weakness;LUE deficits/detail LUE Deficits / Details: grossly weaker than R UE, but functional   Lower Extremity Assessment Lower  Extremity Assessment: Defer to PT evaluation   Cervical / Trunk Assessment Cervical / Trunk Assessment: Kyphotic   Communication Communication Communication: No difficulties   Cognition Arousal/Alertness: Awake/alert Behavior During Therapy: WFL for tasks assessed/performed Overall Cognitive Status: Within Functional Limits for tasks assessed                                     General Comments  pt with fluid collection in back, RN aware    Exercises     Shoulder Instructions      Home Living Family/patient expects to be discharged to:: Private residence Living Arrangements: Children(son ) Available Help at Discharge: Family;Available 24 hours/day Type of Home: House Home Access: Stairs to enter [employer] of Steps: 3 Entrance Stairs-Rails: Right Home Layout: One level     Bathroom Shower/Tub: Occupational psychologist: Standard     Home Equipment: Cane - single point;Shower seat - built in          Prior Functioning/Environment Level of Independence: Independent        Comments: patient was indepedent with self care, limited IADLs (son assist) but completes med mgmt         OT Problem List: Decreased strength;Decreased activity tolerance;Impaired balance (sitting and/or standing);Impaired vision/perception;Decreased knowledge of use of DME or AE;Decreased knowledge of precautions      OT Treatment/Interventions: Self-care/ADL training;Therapeutic exercise;Neuromuscular education;Energy conservation;DME and/or AE instruction;Therapeutic activities;Patient/family education;Balance training;Visual/perceptual remediation/compensation    OT Goals(Current goals can be found in the care plan section) Acute Rehab OT Goals Patient Stated Goal: home OT Goal Formulation: With patient Time For Goal Achievement: 03/16/18 Potential to Achieve Goals: Good  OT Frequency: Min 2X/week   Barriers to D/C:            Co-evaluation               AM-PAC OT "6 Clicks" Daily Activity     Outcome Measure Help from another person eating meals?: None Help from another person taking care of personal grooming?: A Little Help from another person toileting, which includes using toliet, bedpan, or urinal?: A Little Help from another person bathing (including washing, rinsing, drying)?: A Little Help from another person to put on and taking off regular upper body clothing?: None Help from another person to put on and taking off regular lower body clothing?: A Little 6 Click Score: 20   End of Session Equipment Utilized During Treatment: Gait belt;Rolling walker Nurse Communication: Mobility status  Activity Tolerance: Patient tolerated treatment well Patient left: in chair;with call bell/phone within reach;with chair alarm set;with family/visitor present  OT Visit Diagnosis: Unsteadiness on feet (R26.81);Muscle weakness (generalized) (M62.81);Hemiplegia and hemiparesis Hemiplegia - Right/Left: Left Hemiplegia - dominant/non-dominant: Non-Dominant Hemiplegia - caused by: Cerebral infarction                Time: 3474-2595 OT Time Calculation (min): 26 min Charges:  OT General Charges $OT Visit: 1 Visit OT Evaluation $OT Eval Moderate Complexity: 1 Mod OT Treatments $Self Care/Home Management : 8-22 mins  Delight Stare, OT Acute Rehabilitation Services Pager [phone] Office [phone]  Delight Stare 03/02/2018, 12:12 PM

## 2018-03-02 NOTE — Progress Notes (Signed)
Patient's foley became kinked on itself and leaked around the catheter. Patient's bed was soaked with urine. Could not measure this amount even though it was after dose of lasix was given. Patient was cleaned up and foley was repositioned in a way that would be less likely to kink. Will continue to monitor.

## 2018-03-02 NOTE — Evaluation (Signed)
Speech Language Pathology Evaluation Patient Details Name: Haley Costa MRN: [id number] DOB: 1928-10-20 Today's Date: 03/02/2018 Time: 1550-1601 SLP Time Calculation (min) (ACUTE ONLY): 11 min  Problem List:  Patient Active Problem List   Diagnosis Date Noted  . Stroke ([place]) 02/28/2018  . Acute ischemic stroke ([place]) 02/28/2018  . 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 AF (paroxysmal atrial fibrillation) ([place])   . Acute on chronic respiratory failure ([place])   . Hyponatremia 12/09/2017  . Acute exacerbation of CHF (congestive heart failure) ([place]) 11/22/2017  . Chest pain 11/21/2017  . Acute CHF (congestive heart failure) ([place]) 11/21/2017  . HTN (hypertension) 11/21/2017   Past Medical History:  Past Medical History:  Diagnosis Date  . Breast cancer, left breast ([place]) 1992   S/P mastectomy & chemo  . CHF (congestive heart failure) ([place])   . Chronic back pain    "lower and middle back; I've had several broken vertebrae" (12/09/2017)  . Heart murmur   . High cholesterol   . History of kidney stones   . Hypertension   . LBBB (left bundle branch block)    Patient states that she was told by her PCP that she has left bundle blockage  . Osteoarthritis    "some of the joints" (12/09/2017)  . Pneumonia    "when I was a child" (12/09/2017)  . PONV (postoperative nausea and vomiting)    Past Surgical History:  Past Surgical History:  Procedure Laterality Date  . APPENDECTOMY    . BACK SURGERY    . CATARACT EXTRACTION W/ INTRAOCULAR LENS IMPLANT Right   . FIXATION KYPHOPLASTY THORACIC SPINE  2014  . MASTECTOMY Left 1992  . RADIOLOGY WITH ANESTHESIA N/A 02/28/2018   Procedure: RADIOLOGY WITH ANESTHESIA;  Surgeon: [name], MD;  Location: [facility];  Service: Radiology;  Laterality: N/A;   HPI:  Haley Costa is a 84 y.o. female with a history of heart failure, pna, HTN, chronic back pain who presents with left-sided weakness that started tonight. MRI  Small areas of restricted diffusion affecting the RIGHT putamen and RIGHT periventricular white matter, much smaller than the previously identified penumbra, consistent with acute RIGHT MCA territory infarction. Small volume subarachnoid hemorrhage in the RIGHT posterior sylvian fissure. CXR . There are bilateral pleural effusions with mild diffuse pulmonary edema   Assessment / Plan / Recommendation Clinical Impression  Pt presents with functional cognitive linguistic abilities. This is further confirmed by her son who was present during evaluation. Educaiton completed and all questions answered to pt and son's satisfaction. ST to sign off.     SLP Assessment  SLP Recommendation/Assessment: Patient does not need any further Speech Lanaguage Pathology Services SLP Visit Diagnosis: Cognitive communication deficit (R41.841)    Follow Up Recommendations  None          SLP Evaluation Cognition  Overall Cognitive Status: Within Functional Limits for tasks assessed Arousal/Alertness: Awake/alert Orientation Level: Oriented X4 Attention: Selective Selective Attention: Appears intact Memory: Appears intact Awareness: Appears intact Problem Solving: Appears intact Safety/Judgment: Appears intact       Comprehension  Auditory Comprehension Overall Auditory Comprehension: Appears within functional limits for tasks assessed Yes/No Questions: Within Functional Limits Commands: Within Functional Limits Conversation: Complex Visual Recognition/Discrimination Discrimination: Within Function Limits Reading Comprehension Reading Status: Within funtional limits    Expression Expression Primary Mode of Expression: Verbal Verbal Expression Overall Verbal Expression: Appears within functional limits for tasks assessed Initiation: No impairment Written Expression Dominant Hand: Right Written Expression: Not  tested   Oral / Motor  Oral Motor/Sensory Function Overall Oral Motor/Sensory  Function: Within functional limits Motor Speech Overall Motor Speech: Appears within functional limits for tasks assessed Respiration: Within functional limits Phonation: Normal Resonance: Within functional limits Articulation: Within functional limitis Intelligibility: Intelligible Motor Planning: Witnin functional limits Motor Speech Errors: Not applicable   GO                    Ebenezer Mccaskey 03/02/2018, 5:02 PM

## 2018-03-02 NOTE — Progress Notes (Signed)
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 [name]. Patient awake and alert sitting in chair. Accompanied by granddaughter. Complains of "something over my left eye". Granddaughter states it was described as a "film over her eye". Dr. [name] states this is possibly due to her incidental finding of right PCOM aneurysm. Informed patient that we will discuss this finding further at her post-procedure follow-up. Can spontaneously move all extremities. Right groin incision c/d/i.   Allergies: Chlorthalidone; Lisinopril; and Persantine [dipyridamole]  Medications: Prior to Admission medications   Medication Sig Start Date End Date Taking? Authorizing Provider  aspirin EC 81 MG EC tablet Take 1 tablet (81 mg total) by mouth daily. 11/26/17  Yes [name], [name], MD  azithromycin (ZITHROMAX) 500 MG tablet Take 500 mg by mouth daily. 02/20/18 03/02/18 Yes [provider]  carvedilol (COREG) 6.25 MG tablet Take 1 tablet (6.25 mg total) by mouth 2 (two) times daily with a meal. 02/04/18  Yes [name], [name], MD  furosemide (LASIX) 40 MG tablet Take 0.5 tablets (20 mg total) by mouth daily. 12/10/17  Yes [name], [name], MD  losartan (COZAAR) 25 MG tablet Take 1 tablet (25 mg total) by mouth daily. 11/26/17  Yes [name], [name], MD  potassium chloride 20 MEQ TBCR Take 20 mEq by mouth daily. 12/10/17  Yes [name], [name], MD  rosuvastatin (CRESTOR) 5 MG tablet Take 1 tablet (5 mg total) by mouth daily at 6 PM. 11/25/17  Yes [name], [name], MD     Vital Signs: BP (!) 107/54 (BP Location: Right Arm)   Pulse 83   Temp 98.8 F (37.1 C) (Bladder)   Resp 14   Ht 5\' 2"  (1.575 m)   Wt 137 lb 5.6 oz  (62.3 kg)   SpO2 97%   BMI 25.12 kg/m   Physical Exam Constitutional:      General: She is not in acute distress.    Appearance: Normal appearance.  Pulmonary:     Effort: Pulmonary effort is normal. No respiratory distress.  Skin:    General: Skin is warm and dry.     Comments: Right groin incision soft without active bleeding or hematoma.  Neurological:     Mental Status: She is alert.     Comments: Alert, awake, and oriented x3. Speech and comprehension intact. PERRL bilaterally. EOMs intact bilaterally without nystagmus or subjective diplopia. Visual fields not assessed. No facial asymmetry. Tongue midline. Can spontaneously move all extremities. No pronator drift. Fine motor and coordination intact and symmetric. Gait not assessed. Romberg not assessed. Heel to toe not assessed. Distal pulses 2+ bilaterally.  Psychiatric:        Mood and Affect: Mood normal.        Behavior: Behavior normal.        Thought Content: Thought content normal.        Judgment: Judgment normal.     Imaging: Ct Angio Head W Or Wo Contrast  Result Date: 02/28/2018 CLINICAL DATA:  Slurred speech, weakness. EXAM: CT ANGIOGRAPHY HEAD AND NECK CT PERFUSION BRAIN TECHNIQUE: Multidetector CT imaging of the head and neck was performed using the standard protocol during bolus administration of intravenous contrast. Multiplanar CT image reconstructions and MIPs were obtained to evaluate the vascular anatomy. Carotid  stenosis measurements (when applicable) are obtained utilizing NASCET criteria, using the distal internal carotid diameter as the denominator. Multiphase CT imaging of the brain was performed following IV bolus contrast injection. Subsequent parametric perfusion maps were calculated using RAPID software. CONTRAST:  184mL ISOVUE-370 IOPAMIDOL (ISOVUE-370) INJECTION 76% COMPARISON:  CT HEAD February 28, 2018. FINDINGS: CTA NECK FINDINGS: AORTIC ARCH: Normal appearance of the thoracic arch,  normal branch pattern. Moderate calcific atherosclerosis aortic arch. The origins of the innominate, left Common carotid artery and subclavian artery are widely patent. RIGHT CAROTID SYSTEM: Common carotid artery is patent. Moderate calcific atherosclerosis resulting in less than 50% stenosis by NASCET criteria. Patent internal carotid artery. LEFT CAROTID SYSTEM: Common carotid artery is patent. Severe calcific atherosclerosis resulting in less than 50% stenosis by NASCET criteria. Patent internal carotid artery. VERTEBRAL ARTERIES:Left vertebral artery is dominant. Normal appearance of the vertebral arteries, widely patent. SKELETON: No acute osseous process though bone windows have not been submitted. Surgical clips LEFT axilla. OTHER NECK: Soft tissues of the neck are nonacute though, not tailored for evaluation. UPPER CHEST: Small bilateral pleural effusions. Interlobular septal thickening. Diffuse bronchial wall thickening seen with pulmonary edema, bronchitis. Mild debris distended stomach. Centrilobular emphysema. CTA HEAD FINDINGS: ANTERIOR CIRCULATION: Patent cervical internal carotid arteries, petrous, cavernous and supra clinoid internal carotid arteries. 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 Patent anterior communicating artery. Proximal RIGHT M1 occlusion. Good collaterals by single-phase CTA. Patent anterior a LEFT nd middle cerebral arteries, mild luminal irregularity compatible with atherosclerosis. No large vessel occlusion, significant stenosis, contrast extravasation. POSTERIOR CIRCULATION: Patent vertebral arteries, vertebrobasilar junction and basilar artery, as well as main branch vessels. Patent posterior cerebral arteries. No large vessel occlusion, significant stenosis, contrast extravasation or aneurysm. VENOUS SINUSES: Major dural venous sinuses are patent though not tailored for evaluation on this angiographic examination. ANATOMIC VARIANTS: Supernumerary  anterior cerebral artery. Hypoplastic RIGHT A1 segment. DELAYED PHASE: Not performed. MIP images reviewed. CT Brain Perfusion Findings: CBF (<30%) Volume: 14mL Perfusion (Tmax>6.0s) volume: 140mL Mismatch Volume: 166mL Infarction Location:RIGHT frontotemporal parietal lobes. IMPRESSION: CTA NECK: 1. No hemodynamically significant stenosis ICA's. Patent vertebral arteries. 2. Small pleural effusions and findings of pulmonary edema. CTA HEAD: 1. Emergent RIGHT M1 occlusion. Good collaterals by single-phase CTA. 2. 3 mm RIGHT paraophthalmic artery aneurysm. Neuro-Interventional Radiology consultation is suggested to evaluate the appropriateness of potential treatment. Non-emergent evaluation can be arranged by calling [phone] during usual hours. Emergency evaluation can be requested by paging [phone]. CT PERFUSION: 1. RIGHT MCA penumbra. Acute findings discussed with and reconfirmed by Dr.MCNEILL [facility] on 02/28/2018 at 3:50 am. Aortic Atherosclerosis (ICD10-I70.0). Emphysema (ICD10-J43.9). Electronically Signed   By: Elon Alas M.D.   On: 02/28/2018 03:50   Dg Abd 1 View  Result Date: 02/28/2018 CLINICAL DATA:  Orogastric tube placement. EXAM: ABDOMEN - 1 VIEW COMPARISON:  Lumbar spine radiographs 10/09/2017. FINDINGS: 0733 hours. Orogastric tube projects below the diaphragm, tip nonvisualized, projecting over the left iliac crest. The visualized abdomen is nearly gasless. There are bilateral pleural effusions with diffuse bilateral pulmonary opacities, likely edema. Degenerative changes are present throughout the thoracolumbar spine. IMPRESSION: Enteric tube projects into the expected location of the stomach. Electronically Signed   By: Richardean Sale M.D.   On: 02/28/2018 08:37   Ct Angio Neck W Or Wo Contrast  Result Date: 02/28/2018 CLINICAL DATA:  Slurred speech, weakness. EXAM: CT ANGIOGRAPHY HEAD AND NECK CT PERFUSION BRAIN TECHNIQUE: Multidetector CT imaging of the head and neck  was performed using the  standard protocol during bolus administration of intravenous contrast. Multiplanar CT image reconstructions and MIPs were obtained to evaluate the vascular anatomy. Carotid stenosis measurements (when applicable) are obtained utilizing NASCET criteria, using the distal internal carotid diameter as the denominator. Multiphase CT imaging of the brain was performed following IV bolus contrast injection. Subsequent parametric perfusion maps were calculated using RAPID software. CONTRAST:  136mL ISOVUE-370 IOPAMIDOL (ISOVUE-370) INJECTION 76% COMPARISON:  CT HEAD February 28, 2018. FINDINGS: CTA NECK FINDINGS: AORTIC ARCH: Normal appearance of the thoracic arch, normal branch pattern. Moderate calcific atherosclerosis aortic arch. The origins of the innominate, left Common carotid artery and subclavian artery are widely patent. RIGHT CAROTID SYSTEM: Common carotid artery is patent. Moderate calcific atherosclerosis resulting in less than 50% stenosis by NASCET criteria. Patent internal carotid artery. LEFT CAROTID SYSTEM: Common carotid artery is patent. Severe calcific atherosclerosis resulting in less than 50% stenosis by NASCET criteria. Patent internal carotid artery. VERTEBRAL ARTERIES:Left vertebral artery is dominant. Normal appearance of the vertebral arteries, widely patent. SKELETON: No acute osseous process though bone windows have not been submitted. Surgical clips LEFT axilla. OTHER NECK: Soft tissues of the neck are nonacute though, not tailored for evaluation. UPPER CHEST: Small bilateral pleural effusions. Interlobular septal thickening. Diffuse bronchial wall thickening seen with pulmonary edema, bronchitis. Mild debris distended stomach. Centrilobular emphysema. CTA HEAD FINDINGS: ANTERIOR CIRCULATION: Patent cervical internal carotid arteries, petrous, cavernous and supra clinoid internal carotid arteries. 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 Patent  anterior communicating artery. Proximal RIGHT M1 occlusion. Good collaterals by single-phase CTA. Patent anterior a LEFT nd middle cerebral arteries, mild luminal irregularity compatible with atherosclerosis. No large vessel occlusion, significant stenosis, contrast extravasation. POSTERIOR CIRCULATION: Patent vertebral arteries, vertebrobasilar junction and basilar artery, as well as main branch vessels. Patent posterior cerebral arteries. No large vessel occlusion, significant stenosis, contrast extravasation or aneurysm. VENOUS SINUSES: Major dural venous sinuses are patent though not tailored for evaluation on this angiographic examination. ANATOMIC VARIANTS: Supernumerary anterior cerebral artery. Hypoplastic RIGHT A1 segment. DELAYED PHASE: Not performed. MIP images reviewed. CT Brain Perfusion Findings: CBF (<30%) Volume: 2mL Perfusion (Tmax>6.0s) volume: 170mL Mismatch Volume: 163mL Infarction Location:RIGHT frontotemporal parietal lobes. IMPRESSION: CTA NECK: 1. No hemodynamically significant stenosis ICA's. Patent vertebral arteries. 2. Small pleural effusions and findings of pulmonary edema. CTA HEAD: 1. Emergent RIGHT M1 occlusion. Good collaterals by single-phase CTA. 2. 3 mm RIGHT paraophthalmic artery aneurysm. Neuro-Interventional Radiology consultation is suggested to evaluate the appropriateness of potential treatment. Non-emergent evaluation can be arranged by calling [phone] during usual hours. Emergency evaluation can be requested by paging [phone]. CT PERFUSION: 1. RIGHT MCA penumbra. Acute findings discussed with and reconfirmed by Dr.MCNEILL [facility] on 02/28/2018 at 3:50 am. Aortic Atherosclerosis (ICD10-I70.0). Emphysema (ICD10-J43.9). Electronically Signed   By: Elon Alas M.D.   On: 02/28/2018 03:50   Mr Jodene Nam Head Wo Contrast  Result Date: 02/28/2018 CLINICAL DATA:  Acute RIGHT M1 occlusion. Slurred speech with LEFT-sided weakness. TICI3 revascularization. EXAM:  MRI HEAD WITHOUT CONTRAST MRA HEAD WITHOUT CONTRAST TECHNIQUE: Multiplanar, multiecho pulse sequences of the brain and surrounding structures were obtained without intravenous contrast. Angiographic images of the head were obtained using MRA technique without contrast. COMPARISON:  CT head 02/28/2018. CTA head neck 02/28/2018. CT perfusion 02/28/2018. postprocedural CT on the angiographic table, 02/28/2018. FINDINGS: MRI HEAD FINDINGS Brain: Patchy areas of restricted diffusion RIGHT centrum semiovale periventricular white matter, also involving the RIGHT putamen, corresponding low ADC, relatively minor compared with the large penumbra on prior CT  perfusion, consistent with acute RIGHT MCA territory infarction. No acute parenchymal hemorrhage, mass lesion, hydrocephalus, or extra-axial fluid. Faint increased/asymmetric FLAIR signal in the subarachnoid space, posterior sylvian fissure on the RIGHT and over the convexity, corresponds to subarachnoid contrast extravasation on postprocedural CT, consistent with small volume subarachnoid hemorrhage. Vascular: Reported separately. Skull and upper cervical spine: Normal marrow signal. Sinuses/Orbits: No acute findings.  RIGHT cataract extraction. Other: None. MRA HEAD FINDINGS Internal carotid arteries are widely patent. Basilar artery widely patent with LEFT vertebral dominant. Previous RIGHT M1 occlusion has been revascularized, with excellent and symmetric M2 and M3 opacification. Bilobed RIGHT PCOM/paraophthalmic aneurysm, 4 x 6 mm, inferiorly and posteriorly projecting, unchanged compared to prior imaging. IMPRESSION: Small areas of restricted diffusion affecting the RIGHT putamen and RIGHT periventricular white matter, much smaller than the previously identified penumbra, consistent with acute RIGHT MCA territory infarction. These are nonhemorrhagic. Small volume subarachnoid hemorrhage in the RIGHT posterior sylvian fissure and over the convexity, [place] 3C  class of postprocedural intracranial hemorrhage. Bilobed RIGHT PCOM/paraophthalmic aneurysm, 4 x 6 mm, stable. Electronically Signed   By: Staci Righter M.D.   On: 02/28/2018 15:35   Mr Brain Wo Contrast  Result Date: 02/28/2018 CLINICAL DATA:  Acute RIGHT M1 occlusion. Slurred speech with LEFT-sided weakness. TICI3 revascularization. EXAM: MRI HEAD WITHOUT CONTRAST MRA HEAD WITHOUT CONTRAST TECHNIQUE: Multiplanar, multiecho pulse sequences of the brain and surrounding structures were obtained without intravenous contrast. Angiographic images of the head were obtained using MRA technique without contrast. COMPARISON:  CT head 02/28/2018. CTA head neck 02/28/2018. CT perfusion 02/28/2018. postprocedural CT on the angiographic table, 02/28/2018. FINDINGS: MRI HEAD FINDINGS Brain: Patchy areas of restricted diffusion RIGHT centrum semiovale periventricular white matter, also involving the RIGHT putamen, corresponding low ADC, relatively minor compared with the large penumbra on prior CT perfusion, consistent with acute RIGHT MCA territory infarction. No acute parenchymal hemorrhage, mass lesion, hydrocephalus, or extra-axial fluid. Faint increased/asymmetric FLAIR signal in the subarachnoid space, posterior sylvian fissure on the RIGHT and over the convexity, corresponds to subarachnoid contrast extravasation on postprocedural CT, consistent with small volume subarachnoid hemorrhage. Vascular: Reported separately. Skull and upper cervical spine: Normal marrow signal. Sinuses/Orbits: No acute findings.  RIGHT cataract extraction. Other: None. MRA HEAD FINDINGS Internal carotid arteries are widely patent. Basilar artery widely patent with LEFT vertebral dominant. Previous RIGHT M1 occlusion has been revascularized, with excellent and symmetric M2 and M3 opacification. Bilobed RIGHT PCOM/paraophthalmic aneurysm, 4 x 6 mm, inferiorly and posteriorly projecting, unchanged compared to prior imaging. IMPRESSION: Small  areas of restricted diffusion affecting the RIGHT putamen and RIGHT periventricular white matter, much smaller than the previously identified penumbra, consistent with acute RIGHT MCA territory infarction. These are nonhemorrhagic. Small volume subarachnoid hemorrhage in the RIGHT posterior sylvian fissure and over the convexity, [place] 3C class of postprocedural intracranial hemorrhage. Bilobed RIGHT PCOM/paraophthalmic aneurysm, 4 x 6 mm, stable. Electronically Signed   By: Staci Righter M.D.   On: 02/28/2018 15:35   Ct Cerebral Perfusion W Contrast  Result Date: 02/28/2018 CLINICAL DATA:  Slurred speech, weakness. EXAM: CT ANGIOGRAPHY HEAD AND NECK CT PERFUSION BRAIN TECHNIQUE: Multidetector CT imaging of the head and neck was performed using the standard protocol during bolus administration of intravenous contrast. Multiplanar CT image reconstructions and MIPs were obtained to evaluate the vascular anatomy. Carotid stenosis measurements (when applicable) are obtained utilizing NASCET criteria, using the distal internal carotid diameter as the denominator. Multiphase CT imaging of the brain was performed following IV bolus contrast injection.  Subsequent parametric perfusion maps were calculated using RAPID software. CONTRAST:  117mL ISOVUE-370 IOPAMIDOL (ISOVUE-370) INJECTION 76% COMPARISON:  CT HEAD February 28, 2018. FINDINGS: CTA NECK FINDINGS: AORTIC ARCH: Normal appearance of the thoracic arch, normal branch pattern. Moderate calcific atherosclerosis aortic arch. The origins of the innominate, left Common carotid artery and subclavian artery are widely patent. RIGHT CAROTID SYSTEM: Common carotid artery is patent. Moderate calcific atherosclerosis resulting in less than 50% stenosis by NASCET criteria. Patent internal carotid artery. LEFT CAROTID SYSTEM: Common carotid artery is patent. Severe calcific atherosclerosis resulting in less than 50% stenosis by NASCET criteria. Patent internal carotid  artery. VERTEBRAL ARTERIES:Left vertebral artery is dominant. Normal appearance of the vertebral arteries, widely patent. SKELETON: No acute osseous process though bone windows have not been submitted. Surgical clips LEFT axilla. OTHER NECK: Soft tissues of the neck are nonacute though, not tailored for evaluation. UPPER CHEST: Small bilateral pleural effusions. Interlobular septal thickening. Diffuse bronchial wall thickening seen with pulmonary edema, bronchitis. Mild debris distended stomach. Centrilobular emphysema. CTA HEAD FINDINGS: ANTERIOR CIRCULATION: Patent cervical internal carotid arteries, petrous, cavernous and supra clinoid internal carotid arteries. 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 Patent anterior communicating artery. Proximal RIGHT M1 occlusion. Good collaterals by single-phase CTA. Patent anterior a LEFT nd middle cerebral arteries, mild luminal irregularity compatible with atherosclerosis. No large vessel occlusion, significant stenosis, contrast extravasation. POSTERIOR CIRCULATION: Patent vertebral arteries, vertebrobasilar junction and basilar artery, as well as main branch vessels. Patent posterior cerebral arteries. No large vessel occlusion, significant stenosis, contrast extravasation or aneurysm. VENOUS SINUSES: Major dural venous sinuses are patent though not tailored for evaluation on this angiographic examination. ANATOMIC VARIANTS: Supernumerary anterior cerebral artery. Hypoplastic RIGHT A1 segment. DELAYED PHASE: Not performed. MIP images reviewed. CT Brain Perfusion Findings: CBF (<30%) Volume: 60mL Perfusion (Tmax>6.0s) volume: 121mL Mismatch Volume: 168mL Infarction Location:RIGHT frontotemporal parietal lobes. IMPRESSION: CTA NECK: 1. No hemodynamically significant stenosis ICA's. Patent vertebral arteries. 2. Small pleural effusions and findings of pulmonary edema. CTA HEAD: 1. Emergent RIGHT M1 occlusion. Good collaterals by single-phase CTA. 2. 3  mm RIGHT paraophthalmic artery aneurysm. Neuro-Interventional Radiology consultation is suggested to evaluate the appropriateness of potential treatment. Non-emergent evaluation can be arranged by calling [phone] during usual hours. Emergency evaluation can be requested by paging [phone]. CT PERFUSION: 1. RIGHT MCA penumbra. Acute findings discussed with and reconfirmed by Dr.MCNEILL [facility] on 02/28/2018 at 3:50 am. Aortic Atherosclerosis (ICD10-I70.0). Emphysema (ICD10-J43.9). Electronically Signed   By: Elon Alas M.D.   On: 02/28/2018 03:50   Dg Chest Port 1 View  Result Date: 02/28/2018 CLINICAL DATA:  Right MCA occlusion.  Intubation. EXAM: PORTABLE CHEST 1 VIEW COMPARISON:  02/19/2018. FINDINGS: Two views. 0650 hours. Endotracheal tube tip is in the mid trachea. Enteric tube projects to the level of the left lung base. There is cardiomegaly and aortic atherosclerosis. There are bilateral pleural effusions with mild diffuse pulmonary edema. No evidence of pneumothorax. IMPRESSION: 1. Satisfactory position of the endotracheal tube. 2. Enteric tube projects to the level of the left lung base. The patient has already had a follow-up abdominal radiograph at 0733 hours demonstrating repositioning of the tube in the stomach. 3. Pulmonary edema with bilateral pleural effusions. Electronically Signed   By: Richardean Sale M.D.   On: 02/28/2018 08:39   Ct Head Code Stroke Wo Contrast  Result Date: 02/28/2018 CLINICAL DATA:  Code stroke. Aphasic. History of breast cancer, hypertension. EXAM: CT HEAD WITHOUT CONTRAST TECHNIQUE: Contiguous axial images were obtained from the  base of the skull through the vertex without intravenous contrast. COMPARISON:  None. FINDINGS: BRAIN: No intraparenchymal hemorrhage, mass effect nor midline shift. The ventricles and sulci are normal for age. Minimal supratentorial white matter hypodensities less than expected for patient's age, though non-specific are  most compatible with chronic small vessel ischemic disease. Focal blurring of the LEFT occipital gray-white matter junction. No abnormal extra-axial fluid collections. Basal cisterns are patent. VASCULAR: Mild calcific atherosclerosis of the carotid siphons. SKULL: No skull fracture. 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 No significant scalp soft tissue swelling. SINUSES/ORBITS: Trace paranasal sinus mucosal thickening. Small RIGHT mastoid effusion without air cell coalescence.The included ocular globes and orbital contents are non-suspicious. Status post RIGHT ocular lens implant. OTHER: None. ASPECTS [facility] Stroke Program Early CT Score) - Ganglionic level infarction (caudate, lentiform nuclei, internal capsule, insula, M1-M3 cortex): 7 - Supraganglionic infarction (M4-M6 cortex): 3 Total score (0-10 with 10 being normal): 10 IMPRESSION: 1. No acute intracranial process. 2. ASPECTS is 10. 3. Small LEFT occipital lobe infarct, likely chronic. Otherwise negative CT HEAD without contrast for age. 4. Critical Value/emergent results text paged to [facility], Neurology via [employer] secure system on 02/28/2018 at 3:23 am, including interpreting physician's phone number. Electronically Signed   By: Elon Alas M.D.   On: 02/28/2018 03:24    Labs:  CBC: Recent Labs    12/10/17 0008 02/28/18 0303 02/28/18 0305 03/01/18 0158 03/02/18 0712  WBC 5.4 8.0  --  12.9* 7.8  HGB 11.2* 12.8 13.3 10.6* 11.2*  HCT 32.9* 40.4 39.0 32.8* 35.3*  PLT 229 227  --  209 189    COAGS: Recent Labs    02/28/18 0303  INR 1.02  APTT 29    BMP: Recent Labs    01/09/18 1022 02/28/18 0303 02/28/18 0305 03/01/18 0158 03/02/18 0712  NA 132* 134* 134* 138 140  K 4.9 4.0 4.0 3.7 3.6  CL 96 97* 99 104 107  CO2 22 21*  --  22 21*  GLUCOSE 92 191* 187* 105* 83  BUN 12 25* 26* 21 25*  CALCIUM 9.3 9.2  --  8.5* 8.6*  CREATININE 0.89 1.18* 1.00 1.00 0.99  GFRNONAA 59* 42*  --  51*  51*  GFRAA 68 48*  --  59* 59*    LIVER FUNCTION TESTS: Recent Labs    12/09/17 1120 02/28/18 0303 03/01/18 0158  BILITOT 1.3* 1.8* 1.9*  AST 26 54* 28  ALT 17 57* 36  ALKPHOS 87 92 72  PROT 6.2* 6.3* 5.2*  ALBUMIN 3.5 3.5 2.8*    Assessment and Plan:  Right MCA M1 segment occlusion s/p emergent mechanical thrombectomy achieving a TICI 3 revascularization 02/28/2018 by Dr. [name]. Patient's condition improving- can spontaneously move all extremities. Right groin incision stable. Plan to follow-up with Dr. [name] in clinic 4 weeks after discharge. Appreciate and agree with neurology management. IR to follow.   Electronically Signed: Earley Abide, PA-C 03/02/2018, 8:16 AM   I spent a total of 25 Minutes at the the patient's bedside AND on the patient's hospital floor or unit, greater than 50% of which was counseling/coordinating care for right MCA M1 segment occlusion s/p revascularization.

## 2018-03-02 NOTE — Evaluation (Signed)
Physical Therapy Evaluation Patient Details Name: Haley Costa MRN: [id number] DOB: 1928-07-03 Today's Date: 03/02/2018   History of Present Illness  Haley Costa is a 84 y.o. female with a history of heart failure who presents with left-sided weakness. CTA of head reveals R MI occlusion resulting in L MCA Infarct. Pt s/p revasculariation of  occluded RT MCA. PMH: pt with new diagnosis of A.FIB.  Clinical Impression  Pt admitted with above. Pt with mild L sided weakness. Pt functioning near baseline. Pt requiring RW for safe ambulation and to assist with energy conservation due to CHF. Acute PT to cont to follow.    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 Restrictions Weight Bearing Restrictions: No      Mobility  Bed Mobility               General bed mobility comments: pt up in chair upon PT arrival  Transfers Overall transfer level: Needs assistance Equipment used: None Transfers: Sit to/from Stand Sit to Stand: Min guard         General transfer comment: pt pushed up from arm rests, min guard to steady during transition of hands from arm rests to chair  Ambulation/Gait Ambulation/Gait assistance: Min guard Gait Distance (Feet): 50 Feet Assistive device: Rolling walker (2 wheeled) Gait Pattern/deviations: Step-through pattern;Decreased stride length Gait velocity: slow Gait velocity interpretation: <1.31 ft/sec, indicative of household ambulator General Gait Details: pt near baseline, granddaughter reports "she is much steadier with RW" pt with onset of fatigue, most likely due to CHF. Pt with no episodes of LOB but does require RW for safe mobility  Stairs             Wheelchair Mobility    Modified Rankin (Stroke Patients Only) Modified Rankin (Stroke Patients Only) Pre-Morbid Rankin Score: Slight disability Modified Rankin: Moderate disability     Balance Overall balance assessment: Needs assistance Sitting-balance support: Feet supported;No upper extremity supported Sitting balance-Leahy Scale: Fair     Standing balance support: Bilateral upper extremity supported Standing balance-Leahy Scale: Fair Standing balance comment: dependent on RW                             Pertinent Vitals/Pain Pain Assessment: No/denies pain    Home Living Family/patient expects to be discharged to:: Private residence Living Arrangements: Children(son) Available Help at Discharge: Family;Available 24 hours/day Type of Home: House Home Access: Stairs to enter Entrance Stairs-Rails: Right Entrance Stairs-Number of Steps: 3 Home Layout: One level Home Equipment: Cane - single point;Shower seat - built in      Prior Function Level of Independence: Independent         Comments: pt was driving minimally and was furniture walking, pt with limited ambulation tolerance due to CHF and onset of fatigue     Hand Dominance   Dominant Hand: Right    Extremity/Trunk Assessment   Upper Extremity Assessment Upper Extremity Assessment: Defer to OT evaluation    Lower Extremity Assessment Lower Extremity Assessment: Generalized weakness    Cervical / Trunk Assessment Cervical / Trunk Assessment: Kyphotic  Communication   Communication: No difficulties  Cognition Arousal/Alertness: Awake/alert Behavior During Therapy: WFL for tasks assessed/performed  Overall Cognitive Status: Within Functional Limits for tasks assessed                                        General Comments General comments (skin integrity, edema, etc.): pt with fluid collection in back, RN aware    Exercises     Assessment/Plan    PT  Assessment Patient needs continued PT services  PT Problem List Decreased strength;Decreased range of motion;Decreased activity tolerance;Decreased balance;Decreased mobility;Decreased coordination;Decreased cognition;Decreased knowledge of use of DME;Decreased safety awareness       PT Treatment Interventions DME instruction;Stair training;Functional mobility training;Gait training;Therapeutic activities;Therapeutic exercise;Balance training    PT Goals (Current goals can be found in the Care Plan section)  Acute Rehab PT Goals Patient Stated Goal: home PT Goal Formulation: With patient Time For Goal Achievement: 03/16/18 Potential to Achieve Goals: Good    Frequency Min 4X/week   Barriers to discharge        Co-evaluation               AM-PAC PT "6 Clicks" Mobility  Outcome Measure Help needed turning from your back to your side while in a flat bed without using bedrails?: A Little Help needed moving from lying on your back to sitting on the side of a flat bed without using bedrails?: A Little Help needed moving to and from a bed to a chair (including a wheelchair)?: A Little Help needed standing up from a chair using your arms (e.g., wheelchair or bedside chair)?: A Little Help needed to walk in hospital room?: A Little Help needed climbing 3-5 steps with a railing? : A Lot 6 Click Score: 17    End of Session Equipment Utilized During Treatment: Gait belt Activity Tolerance: Patient tolerated treatment well Patient left: in chair;with call bell/phone within reach;with chair alarm set;with family/visitor present Nurse Communication: Mobility status PT Visit Diagnosis: Unsteadiness on feet (R26.81);Difficulty in walking, not elsewhere classified (R26.2)    Time: 2633-3545 PT Time Calculation (min) (ACUTE ONLY): 31 min   Charges:   PT Evaluation $PT Eval Moderate Complexity: 1 Mod PT Treatments $Gait Training: 8-22 mins        Kittie Plater, PT, DPT Acute  Rehabilitation Services Pager #: [phone] Office #: [phone]   Berline Lopes 03/02/2018, 9:40 AM

## 2018-03-02 NOTE — Progress Notes (Addendum)
STROKE TEAM PROGRESS NOTE   SUBJECTIVE (INTERVAL HISTORY) No family is at the bedside.  Patient up in the chair at the bedside. Plan to start [place] at day 3 for AF. Off BP drips. Awaiting transfer to the floor.   OBJECTIVE Vitals:   03/02/18 0800 03/02/18 0900 03/02/18 1000 03/02/18 1200  BP: 124/63 122/62 119/62 (!) 112/56  Pulse: 90 88 96 81  Resp: (!) 22 (!) 22 (!) 25 19  Temp: 99.1 F (37.3 C) 99.3 F (37.4 C)  97.8 F (36.6 C)  TempSrc: Oral   Oral  SpO2: 94% 95% 95% 94%  Weight:      Height:        CBC:  Recent Labs  Lab 02/28/18 0303  03/01/18 0158 03/02/18 0712  WBC 8.0  --  12.9* 7.8  NEUTROABS 5.2  --  9.3*  --   HGB 12.8   < > 10.6* 11.2*  HCT 40.4   < > 32.8* 35.3*  MCV 97.6  --  96.2 97.5  PLT 227  --  209 189   < > = values in this interval not displayed.    Basic Metabolic Panel:  Recent Labs  Lab 02/28/18 1010 03/01/18 0158 03/02/18 0712  NA  --  138 140  K  --  3.7 3.6  CL  --  104 107  CO2  --  22 21*  GLUCOSE  --  105* 83  BUN  --  21 25*  CREATININE  --  1.00 0.99  CALCIUM  --  8.5* 8.6*  MG 2.0  --   --   PHOS  --  4.1  --     Lipid Panel:     Component Value Date/Time   CHOL 174 02/28/2018 0303   TRIG 57 02/28/2018 1010   HDL 60 02/28/2018 0303   CHOLHDL 2.9 02/28/2018 0303   VLDL 16 02/28/2018 0303   LDLCALC 98 02/28/2018 0303   HgbA1c:  Lab Results  Component Value Date   HGBA1C 5.3 02/28/2018   Urine Drug Screen: No results found for: LABOPIA, COCAINSCRNUR, LABBENZ, AMPHETMU, THCU, LABBARB  Alcohol Level     Component Value Date/Time   ETH <10 02/28/2018 0303    IMAGING Ct Head Code Stroke Wo Contrast 02/28/2018 1. No acute intracranial process.  2. ASPECTS is 10.  3. Small LEFT occipital lobe infarct, likely chronic. Otherwise negative CT HEAD without contrast for age.   Ct Angio Head W Or Wo Contrast 02/28/2018 1. Emergent RIGHT M1 occlusion. Good collaterals by single-phase CTA.  2. 3 mm RIGHT  paraophthalmic artery aneurysm. Neuro-Interventional Radiology consultation is suggested to evaluate the appropriateness of potential treatment.   Ct Angio Neck W Or Wo Contrast 02/28/2018 1. No hemodynamically significant stenosis ICA's. Patent vertebral arteries.  2. Small pleural effusions and findings of pulmonary edema.  Aortic Atherosclerosis (ICD10-I70.0).  Emphysema (ICD10-J43.9).   Ct Cerebral Perfusion W Contrast 02/28/2018 1. RIGHT MCA penumbra.  DSA  S/P RT common carotid arteriogram followed by complete revascularization of occluded RT MCA M 1 seg with x 1 pass with 59mm x 26mm embotrap retriever device achieving a TICI 3 revascularization. Approx 5mm x 6 mm  bilobed RT PCOM aneurysm.  Mr Brain 55 Contrast Mr Virgel Paling Wo Contrast 02/28/2018 Small areas of restricted diffusion affecting the RIGHT putamen and RIGHT periventricular white matter, much smaller than the previously identified penumbra, consistent with acute RIGHT MCA territory infarction. These are nonhemorrhagic. Small volume subarachnoid hemorrhage in the  RIGHT posterior sylvian fissure and over the convexity, [place] 3C class of postprocedural intracranial hemorrhage. Bilobed RIGHT PCOM/paraophthalmic aneurysm, 4 x 6 mm, stable.   Dg Chest Port 1 View 03/02/2018 Bilateral effusions and bilateral lower lobe atelectasis and or pneumonia. Electronically Signed   By: Nelson Chimes M.D.   On: 03/02/2018 10:22   2D Echocardiogram  - Left ventricle: The cavity size was normal. Wall thickness was normal. Systolic function was severely reduced. The estimated ejection fraction was in the range of 25% to 30%. Dyskinesis of the basal-midanteroseptal myocardium. - Aortic valve: Moderately calcified annulus. Moderately thickened, moderately calcified leaflets. Valve area (VTI): 2.58 cm^2. Valve area (Vmax): 2.71 cm^2. Valve area (Vmean): 2.28 cm^2. - Mitral valve: There was severe regurgitation. Valve area by continuity  equation (using LVOT flow): 1.42 cm^2. - Left atrium: The atrium was severely dilated. - Right ventricle: The cavity size was mildly decreased. Systolic function was moderately reduced. - Right atrium: The atrium was mildly dilated. - Tricuspid valve: There was moderate regurgitation. - Pulmonary arteries: Systolic pressure was moderately to severely increased. PA peak pressure: 60 mm Hg (S).   PHYSICAL EXAM General - Well nourished, well developed, not in distress. Cardiovascular - irregularly irregular heart rate and rhythm.  Neuro - awake, alert, orientated x 3. No aphasia, follows complex commands. Eyes middle position, EOMI, PERRL, visual field full, mild L facial weakness. Tongue midline in mouth.  Bilateral upper extremity 4/5, bilateral lower extremity 3/5 proximal, LLE distal 4/5 and RLE distal 5/5.  DTR 1+, no Babinski. Sensation symmetrical, coordination intact and gait not tested.   ASSESSMENT/PLAN [name] is a 84 y.o. female with history of CHF with EF 25 to 30%, HTN, HLD, LBBB and left breast cancer status post surgery and chemotherapy presenting with Lt sided weakness.  She did not receive IV t-PA due to late presentation.  Stroke:  Rt MCA small infarcts at right BG and periventricular due to right M1 occlusion status post IR with TICI3 reperfusion, embolic, likely due to severe CHF and newly diagnosed A. fib  CT head - No acute intracranial process. Small LEFT occipital lobe infarct, likely chronic.  CTA H&N - Emergent RIGHT M1 occlusion.  CTP positive for penumbra  DSA - S/P revascularization of occluded RT MCA M 1 achieving a TICI 3 revascularization.  MRI head - right BG and periventricular small infarct. Small post procedural right sylvian fissure SAH.   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 No source of embolus   LDL - 98  HgbA1c - 5.3  VTE prophylaxis - SCDs  Diet - Regular thin liquids   aspirin 81 mg daily prior to  admission, now on aspirin 325 mg daily. Will start [place] at day 3 (03/03/18) given small stroke and post procedural SAH.   Therapy recommendations:  HH PT, HH OT  Disposition:  Pending (lives w/ son)  Afib, new diagnosis  Found on telemetry monitoring this morning  EKG showed A. fib episode  On Coreg 6.25  On ASA 325  Will start DOAC at day 3 (03/03/18) given small stroke and post procedural SAH.  CHF  TTE EF 25-30%, stable.   TTE in 11/2016 showed EF 25 to 30%  Following with cardiology on Coreg and losartan  Coreg resumed  Lasix given during the night for crackles. Lungs now clear  CCM on board  Respiratory failure, resolved  Intubated for procedure, now extubated  Tolerating well  Treated  with new, now off  CCM on board  Hypertension  Stable . BP goal 100-160 . On Coreg . Treated post IR w/ Cleviprex . Long-term BP goal normotensive  Hyperlipidemia  Lipid lowering medication PTA:  Crestor 5 mg daily  LDL 98, goal < 70  Current lipid lowering medication: Crestor 5 mg daily  Continue statin at discharge  PCOM aneurysm   DSA showed 8mm x 6 mm  bilobed RT PCOM aneurysm  MRA showed 4x72mm bilobed right PCOM aneurysm  Incidental finding  Follow-up with Dr. [name] as outpt  Other Stroke Risk Factors  Advanced age  Hx stroke/TIA - by imaging   Other Active Problems  Leukocytosis, resolved 8.0->12.9->7.8   Hospital day # 2  Burnetta Sabin, MSN, APRN, ANVP-BC, AGPCNP-BC Advanced Practice Stroke Nurse [place] for Schedule & Pager information 03/02/2018 3:24 PM   ATTENDING NOTE: I reviewed above note and agree with the assessment and plan. Pt was seen and examined.   Patient granddaughter at bedside.  Patient sitting in chair, passed a swallow, on diet.  No complaints, telemetry still showing A. fib.  Will start Eliquis tomorrow.  EF 25 to 30%, no change from 3 months ago.  Resume Coreg and Lasix, hold off  losartan for now due to soft BP.  She will continue follow-up with Dr. Stanford Breed in cardiology.  Currently on aspirin and Crestor.  Transfer out of ICU.  Rosalin Hawking, MD PhD Stroke Neurology 03/02/2018 9:53 PM     To contact Stroke Continuity provider, please refer to [email or web address] After hours, contact General Neurology

## 2018-03-02 NOTE — Progress Notes (Signed)
   NAME:  KA BENCH, MRN:  [id number], DOB:  December 25, 1928, LOS: 2 ADMISSION DATE:  02/28/2018, CONSULTATION DATE:  02/28/2018 REFERRING MD:  Dr. Leonel Ramsay, CHIEF COMPLAINT:  stroke  Brief History   84 yoF presenting with acute left sided weakness found to have acute right M1 occlusion s/p EVR with successful mechanical thrombectomy and revascularization.  Returned to ICU vented.  Past Medical History  Systolic /diastolic HF (EF 44-01%, U2VO, mod MR, PAP 37 mmHg), murmur, HLD, HTN, LBBB, osteoarthritis, breast cancer  Significant Hospital Events   12/14 Admitted   Consults:  Neuro IR PCCM   Procedures:  12/14 cerebral angiogram s/p EVR  Significant Diagnostic Tests:  12/14 CTH >> 1. No acute intracranial process. 2. ASPECTS is 10. 3. Small LEFT occipital lobe infarct, likely chronic. Otherwise negative CT HEAD without contrast for age.  12/14 CTA head/ neck/ perfusion >> No hemodynamically significant stenosis ICA's. Patent vertebral arteries. Small pleural effusions and findings of pulmonary edema Emergent RIGHT M1 occlusion. Good collaterals by single-phase CTA. 3 mm RIGHT paraophthalmic artery aneurysm. Right MCA penumbra  Micro Data:  12/14 MRSA PCR >>  Antimicrobials:  12/14 ancef preop  Interim history/subjective:    Objective   Blood pressure 119/62, pulse 96, temperature 99.3 F (37.4 C), resp. rate (Abnormal) 25, height 5\' 2"  (1.575 m), weight 62.3 kg, SpO2 95 %.     Intake/Output Summary (Last 24 hours) at 03/02/2018 1029 Last data filed at 03/02/2018 1011 Gross per 24 hour  Intake 1141.04 ml  Output 1545 ml  Net -403.96 ml   Filed Weights   02/28/18 0639 02/28/18 0645 02/28/18 1247  Weight: 64 kg 64 kg 62.3 kg    Examination: General pleasant 84 year old white female sitting up in bed HENT NCAT no JVD pulm clear  Card reg irreg no MRG abd soft not tender + bowel sounds GU voids  Neuro awake, oriented. No focal def  Ext brisk CR    Resolved Hospital Problem list   Mild transaminates  acute respiratory failure s/p procedure (extubated 12/15) Drug related Hypotension   Assessment & Plan:  Acute R M1 occlusion s/p EVR with successful revascularization Plan Start DOAC day 3 Cont asa PT consulted  Afib - new onset, not noted on prior EKG Plan Cont coreg  Hx systolic/diastolic HF (EF 53-66%, Y4IH, mod MR, PAP 37 mmHg) , HLD, mod M Plan Coreg and lasix ordered  Cont crestor   Mild AKI resolving. Plan Avoid hypotension  PRN chemistries   Best practice:  Diet: NPO Pain/Anxiety/Delirium protocol (if indicated): propofol / prn fentanyl VAP protocol (if indicated): yes DVT prophylaxis: SCDs GI prophylaxis: pepcid Glucose control: SSI Mobility: BR - PT and progressive ambulation once extubated.  Code Status: Full but will need to clarify with family as patient was prior DNR   Family Communication: no family at bedside Disposition: no longer critically ill. PCCM will sign off   Erick Colace ACNP-BC [place] Pager # [phone] OR # [phone] if no answer

## 2018-03-03 DIAGNOSIS — E785 Hyperlipidemia, unspecified: Secondary | ICD-10-CM | POA: Diagnosis present

## 2018-03-03 DIAGNOSIS — I10 Essential (primary) hypertension: Secondary | ICD-10-CM

## 2018-03-03 DIAGNOSIS — I671 Cerebral aneurysm, nonruptured: Secondary | ICD-10-CM | POA: Diagnosis present

## 2018-03-03 DIAGNOSIS — I5023 Acute on chronic systolic (congestive) heart failure: Secondary | ICD-10-CM

## 2018-03-03 LAB — CBC
HCT: 31.9 % — ABNORMAL LOW (ref 36.0–46.0)
HEMOGLOBIN: 10.5 g/dL — AB (ref 12.0–15.0)
MCH: 31.7 pg (ref 26.0–34.0)
MCHC: 32.9 g/dL (ref 30.0–36.0)
MCV: 96.4 fL (ref 80.0–100.0)
Platelets: 179 10*3/uL (ref 150–400)
RBC: 3.31 MIL/uL — ABNORMAL LOW (ref 3.87–5.11)
RDW: 14.5 % (ref 11.5–15.5)
WBC: 7.7 10*3/uL (ref 4.0–10.5)
nRBC: 0 % (ref 0.0–0.2)

## 2018-03-03 LAB — BASIC METABOLIC PANEL
Anion gap: 10 (ref 5–15)
BUN: 27 mg/dL — ABNORMAL HIGH (ref 8–23)
CALCIUM: 8.5 mg/dL — AB (ref 8.9–10.3)
CHLORIDE: 108 mmol/L (ref 98–111)
CO2: 23 mmol/L (ref 22–32)
CREATININE: 1 mg/dL (ref 0.44–1.00)
GFR calc Af Amer: 59 mL/min — ABNORMAL LOW (ref >60)
GFR calc non Af Amer: 51 mL/min — ABNORMAL LOW (ref >60)
Glucose, Bld: 91 mg/dL (ref 70–99)
Potassium: 3.7 mmol/L (ref 3.5–5.1)
Sodium: 141 mmol/L (ref 135–145)

## 2018-03-03 MED ORDER — STROKE: EARLY STAGES OF RECOVERY BOOK
Freq: Once | Status: AC
  Administered 2018-03-03: 13:00:00
  Filled 2018-03-03 (×2): qty 1

## 2018-03-03 MED ORDER — APIXABAN 5 MG PO TABS: 5.0000 mg | ORAL_TABLET | Freq: Two times a day (BID) | ORAL | 2 refills | Status: DC

## 2018-03-03 MED ORDER — APIXABAN 5 MG PO TABS
5.0000 mg | ORAL_TABLET | Freq: Two times a day (BID) | ORAL | Status: DC
  Administered 2018-03-03: 5 mg via ORAL
  Filled 2018-03-03: qty 1

## 2018-03-03 NOTE — Discharge Instructions (Signed)

## 2018-03-03 NOTE — Progress Notes (Signed)
Physical Therapy Treatment Patient Details Name: Haley Costa MRN: [id number] DOB: [birth year] Today's Date: 03/03/2018    History of Present Illness Haley Costa is a 84 y.o. female with a history of heart failure who presents with left-sided weakness. CTA of head reveals R MI occlusion resulting in L MCA Infarct. Pt s/p revasculariation of  occluded RT MCA. PMH: pt with new diagnosis of A.FIB.    PT Comments    Patient seen for mobility progression. Pt tolerated gait training this am with min guard assist and RW and able to ascend/descend steps simulating home entrance with min A.  Pt will continue to benefit from further skilled PT services to maximize independence and safety with mobility.    Follow Up Recommendations  Home health PT;Supervision/Assistance - 24 hour     Equipment Recommendations  Rolling walker with 5" wheels;3in1 (PT);Other (comment)(youth RW )    Recommendations for Other Services       Precautions / Restrictions Precautions Precautions: Fall Precaution Comments: R eye blurriness (aneurysm of 66mm behind eye) - 03/03/18 Pt reports this is improving Restrictions Weight Bearing Restrictions: No    Mobility  Bed Mobility Overal bed mobility: Modified Independent                Transfers Overall transfer level: Needs assistance Equipment used: Rolling walker (2 wheeled) Transfers: Sit to/from [facility] Sit to Stand: Min guard Stand pivot transfers: Min guard       General transfer comment: cues for safe hand placement and min guard for safety  Ambulation/Gait Ambulation/Gait assistance: Min guard Gait Distance (Feet): 50 Feet Assistive device: Rolling walker (2 wheeled) Gait Pattern/deviations: Step-through pattern;Decreased stride length;Trunk flexed Gait velocity: decreased   General Gait Details: cues for posture and proximity to RW; min guard for safety; no physical assist required   Stairs Stairs:  Yes Stairs assistance: Min assist Stair Management: One rail Right;Step to pattern;Forwards;Sideways Number of Stairs: 4 General stair comments: cues for sequencing and technique; pt more steady descending sideways with bilat UE support on rail   Wheelchair Mobility    Modified Rankin (Stroke Patients Only) Modified Rankin (Stroke Patients Only) Pre-Morbid Rankin Score: Slight disability Modified Rankin: Moderate disability     Balance Overall balance assessment: Needs assistance Sitting-balance support: Feet supported;No upper extremity supported Sitting balance-Leahy Scale: Good     Standing balance support: Bilateral upper extremity supported Standing balance-Leahy Scale: Fair Standing balance comment: reliant on UE support with RW, & at times while standing while grooming, min guard required, fatigues easily.                            Cognition Arousal/Alertness: Awake/alert Behavior During Therapy: WFL for tasks assessed/performed Overall Cognitive Status: Within Functional Limits for tasks assessed                                        Exercises      General Comments        Pertinent Vitals/Pain Pain Assessment: No/denies pain Pain Score: 0-No pain Faces Pain Scale: No hurt    Home Living                      Prior Function            PT Goals (current goals can now be found in  the care plan section) Acute Rehab PT Goals Patient Stated Goal: home Progress towards PT goals: Progressing toward goals    Frequency    Min 4X/week      PT Plan Current plan remains appropriate    Co-evaluation              AM-PAC PT "6 Clicks" Mobility   Outcome Measure  Help needed turning from your back to your side while in a flat bed without using bedrails?: A Little Help needed moving from lying on your back to sitting on the side of a flat bed without using bedrails?: A Little Help needed moving to and from a  bed to a chair (including a wheelchair)?: None Help needed standing up from a chair using your arms (e.g., wheelchair or bedside chair)?: A Little Help needed to walk in hospital room?: A Little Help needed climbing 3-5 steps with a railing? : A Little 6 Click Score: 19    End of Session Equipment Utilized During Treatment: Gait belt Activity Tolerance: Patient tolerated treatment well Patient left: in chair;with call bell/phone within reach Nurse Communication: Mobility status PT Visit Diagnosis: Unsteadiness on feet (R26.81);Difficulty in walking, not elsewhere classified (R26.2)     Time: 1115-1140 PT Time Calculation (min) (ACUTE ONLY): 25 min  Charges:  $Gait Training: 23-37 mins                     Earney Navy, PTA Acute Rehabilitation Services Pager: [phone] Office: [phone]     Darliss Cheney 03/03/2018, 11:55 AM

## 2018-03-03 NOTE — Discharge Summary (Addendum)
Stroke Discharge Summary  Patient ID: Haley Costa   MRN: [id number]      DOB: Mar 07, 1929  Date of Admission: 02/28/2018 Date of Discharge: 03/03/2018  Attending Physician:  Rosalin Hawking, MD, Stroke MD Consultant(s):   pulmonary/intensive care, Olene Craven) [name], MD (Interventional Neuroradiologist)  Patient's PCP:  [name], DO  DISCHARGE DIAGNOSIS:  Principal Problem:   Acute ischemic stroke [facility]), s/p mechanical throbmectomy Active Problems:   Acute CHF (congestive heart failure) ([place])   HTN (hypertension)   Middle cerebral artery embolism, right   AF (paroxysmal atrial fibrillation) ([place])   Acute on chronic respiratory failure ([place])   Hyperlipidemia LDL goal <70   Posterior cerebral aneurysm, R PComm   Past Medical History:  Diagnosis Date  . Breast cancer, left breast ([place]) 1992   S/P mastectomy & chemo  . CHF (congestive heart failure) ([place])   . Chronic back pain    "lower and middle back; I've had several broken vertebrae" (12/09/2017)  . Heart murmur   . High cholesterol   . History of kidney stones   . Hypertension   . LBBB (left bundle branch block)    Patient states that she was told by her PCP that she has left bundle blockage  . Osteoarthritis    "some of the joints" (12/09/2017)  . Pneumonia    "when I was a child" (12/09/2017)  . PONV (postoperative nausea and vomiting)    Past Surgical History:  Procedure Laterality Date  . APPENDECTOMY    . BACK SURGERY    . CATARACT EXTRACTION W/ INTRAOCULAR LENS IMPLANT Right   . FIXATION KYPHOPLASTY THORACIC SPINE  2014  . MASTECTOMY Left 1992  . RADIOLOGY WITH ANESTHESIA N/A 02/28/2018   Procedure: RADIOLOGY WITH ANESTHESIA;  Surgeon: [name], MD;  Location: [place];  Service: Radiology;  Laterality: N/A;    Allergies as of 03/03/2018      Reactions   Chlorthalidone Other (See Comments)   Lisinopril Swelling   Eye  And cheek swelling   Persantine [dipyridamole] Other (See  Comments)      Medication List    STOP taking these medications   aspirin 81 MG EC tablet   azithromycin 500 MG tablet Commonly known as:  ZITHROMAX     TAKE these medications   apixaban 5 MG Tabs tablet Commonly known as:  ELIQUIS Take 1 tablet (5 mg total) by mouth 2 (two) times daily.   carvedilol 6.25 MG tablet Commonly known as:  COREG Take 1 tablet (6.25 mg total) by mouth 2 (two) times daily with a meal.   furosemide 40 MG tablet Commonly known as:  LASIX Take 0.5 tablets (20 mg total) by mouth daily.   losartan 25 MG tablet Commonly known as:  COZAAR Take 1 tablet (25 mg total) by mouth daily.   Potassium Chloride ER 20 MEQ Tbcr Take 20 mEq by mouth daily.   rosuvastatin 5 MG tablet Commonly known as:  CRESTOR Take 1 tablet (5 mg total) by mouth daily at 6 PM.            Durable Medical Equipment  (From admission, onward)         Start     Ordered   03/03/18 1418  For home use only DME 3 n 1  Once     03/03/18 1417   03/03/18 1417  For home use only DME Walker rolling  Once    Question:  Patient needs a walker  to treat with the following condition  Answer:  Stroke (cerebrum) ([place])   03/03/18 1417          LABORATORY STUDIES CBC    Component Value Date/Time   WBC 7.7 03/03/2018 0307   RBC 3.31 (L) 03/03/2018 0307   HGB 10.5 (L) 03/03/2018 0307   HCT 31.9 (L) 03/03/2018 0307   PLT 179 03/03/2018 0307   MCV 96.4 03/03/2018 0307   MCH 31.7 03/03/2018 0307   MCHC 32.9 03/03/2018 0307   RDW 14.5 03/03/2018 0307   LYMPHSABS 1.8 03/01/2018 0158   MONOABS 1.6 (H) 03/01/2018 0158   EOSABS 0.0 03/01/2018 0158   BASOSABS 0.0 03/01/2018 0158   CMP    Component Value Date/Time   NA 141 03/03/2018 0307   NA 132 (L) 01/09/2018 1022   K 3.7 03/03/2018 0307   CL 108 03/03/2018 0307   CO2 23 03/03/2018 0307   GLUCOSE 91 03/03/2018 0307   BUN 27 (H) 03/03/2018 0307   BUN 12 01/09/2018 1022   CREATININE 1.00 03/03/2018 0307   CALCIUM 8.5 (L)  03/03/2018 0307   PROT 5.2 (L) 03/01/2018 0158   ALBUMIN 2.8 (L) 03/01/2018 0158   AST 28 03/01/2018 0158   ALT 36 03/01/2018 0158   ALKPHOS 72 03/01/2018 0158   BILITOT 1.9 (H) 03/01/2018 0158   GFRNONAA 51 (L) 03/03/2018 0307   GFRAA 59 (L) 03/03/2018 0307   COAGS Lab Results  Component Value Date   INR 1.02 02/28/2018   INR 0.89 05/15/2012   Lipid Panel    Component Value Date/Time   CHOL 174 02/28/2018 0303   TRIG 57 02/28/2018 1010   HDL 60 02/28/2018 0303   CHOLHDL 2.9 02/28/2018 0303   VLDL 16 02/28/2018 0303   LDLCALC 98 02/28/2018 0303   HgbA1C  Lab Results  Component Value Date   HGBA1C 5.3 02/28/2018   Urinalysis    Component Value Date/Time   COLORURINE YELLOW 12/09/2017 1323   APPEARANCEUR CLEAR 12/09/2017 1323   LABSPEC 1.005 12/09/2017 1323   PHURINE 6.0 12/09/2017 1323   GLUCOSEU NEGATIVE 12/09/2017 1323   HGBUR NEGATIVE 12/09/2017 1323   BILIRUBINUR NEGATIVE 12/09/2017 1323   KETONESUR 20 (A) 12/09/2017 1323   PROTEINUR NEGATIVE 12/09/2017 1323   NITRITE NEGATIVE 12/09/2017 1323   LEUKOCYTESUR NEGATIVE 12/09/2017 1323   Urine Drug Screen No results found for: LABOPIA, COCAINSCRNUR, LABBENZ, AMPHETMU, THCU, LABBARB  Alcohol Level    Component Value Date/Time   ETH <10 02/28/2018 0303     SIGNIFICANT DIAGNOSTIC STUDIES Ct Head Code Stroke Wo Contrast 02/28/2018 1. No acute intracranial process.  2. ASPECTS is 10.  3. Small LEFT occipital lobe infarct, likely chronic. Otherwise negative CT HEAD without contrast for age.   Ct Angio Head W Or Wo Contrast 02/28/2018 1. Emergent RIGHT M1 occlusion. Good collaterals by single-phase CTA.  2. 3 mm RIGHT paraophthalmic artery aneurysm. Neuro-Interventional Radiology consultation is suggested to evaluate the appropriateness of potential treatment.   Ct Angio Neck W Or Wo Contrast 02/28/2018 1. No hemodynamically significant stenosis ICA's. Patent vertebral arteries.  2. Small pleural  effusions and findings of pulmonary edema.  Aortic Atherosclerosis (ICD10-I70.0).  Emphysema (ICD10-J43.9).   Ct Cerebral Perfusion W Contrast 02/28/2018 1. RIGHT MCA penumbra.  DSA  S/P RT common carotid arteriogram followed by complete revascularization of occluded RT MCA M 1 seg with x 1 pass with 86mm x 3mm embotrap retriever device achieving a TICI 3 revascularization. Approx 69mm x 6 mm bilobed RT  PCOM aneurysm.  Mr Brain 65 Contrast Mr Virgel Paling Wo Contrast 02/28/2018 Small areas of restricted diffusion affecting the RIGHT putamen and RIGHT periventricular white matter, much smaller than the previously identified penumbra, consistent with acute RIGHT MCA territory infarction. These are nonhemorrhagic. Small volume subarachnoid hemorrhage in the RIGHT posterior sylvian fissure and over the convexity, [place] 3C class of postprocedural intracranial hemorrhage. Bilobed RIGHT PCOM/paraophthalmic aneurysm, 4 x 6 mm, stable.   Dg Chest Port 1 View 03/02/2018 Bilateral effusions and bilateral lower lobe atelectasis and or pneumonia. Electronically Signed   By: Nelson Chimes M.D.   On: 03/02/2018 10:22   2D Echocardiogram  - Left ventricle: The cavity size was normal. Wall thickness wasnormal. Systolic function was severely reduced. The estimatedejection fraction was in the range of 25% to 30%. Dyskinesis ofthe basal-midanteroseptal myocardium. - Aortic valve: Moderately calcified annulus. Moderately thickened,moderately calcified leaflets. Valve area (VTI): 2.58 cm^2. Valvearea (Vmax): 2.71 cm^2. Valve area (Vmean): 2.28 cm^2. - Mitral valve: There was severe regurgitation. Valve area bycontinuity equation (using LVOT flow): 1.42 cm^2. - Left atrium: The atrium was severely dilated. - Right ventricle: The cavity size was mildly decreased. Systolicfunction was moderately reduced. - Right atrium: The atrium was mildly dilated. - Tricuspid valve: There was moderate  regurgitation. - Pulmonary arteries: Systolic pressure was moderately to severelyincreased. PA peak pressure: 60 mm Hg (S).     HISTORY OF PRESENT ILLNESS Ricky R Bones is a 84 y.o. female with a history of heart failure who presents with left-sided weakness that started tonight.  She went to bed 02/27/2018 about at 2200 and when she awoke she was unable to roll over. She called her son who came in and found her with left-sided weakness and therefore called 911.  She was brought into the emergency department where an M1 occlusion with significant penumbra was identified. Dr. Leonel Ramsay discussed intervention with the family and patient who wished to proceed.  Of note, she has been DNR but this is in the setting of cardiac arrest, not for procedures.   At baseline, she does have some limitation due to her heart failure with shortness of breath.  She is, however, able to attend to all of her own activities of daily living. Modified Rankin Scale: 2.   She was not a tpa candidate as she was outside of window.   HOSPITAL COURSE Ms. Danniela LORAE ROIG is a 84 y.o. female with history of CHF with EF 25 to 30%, HTN, HLD, LBBB and left breast cancer status post surgery and chemotherapy presenting with Lt sided weakness.  She did not receive IV t-PA due to late presentation. She had TICI3 revascularization s/p mechanical thrombectomy of R M1 occlusion  Stroke:  Rt MCA small infarcts at right BG and periventricular due to right M1 occlusion status post IR with TICI3 reperfusion and SAH, infarct embolic, likely due to severe CHF and newly diagnosed A. Fib.   CT head - No acute intracranial process. Small LEFT occipital lobe infarct, likely chronic.  CTA H&N - Emergent RIGHT M1 occlusion.  CTP positive for penumbra  DSA - S/P revascularization of occluded RT MCA M 1 achieving a TICI 3 revascularization.  MRI head - right BG and periventricular small infarct. Small post procedural right  sylvian fissure SAH.   MRA head - bilobed PCOM aneurysm 4x66mm  2D Echo - EF 25-30%. No source of embolus   LDL - 98  HgbA1c - 5.3  aspirin 81 mg daily prior to admission, treated  with aspirin 325 mg daily in the hospital. At day 3 (03/03/18) given small stroke and post procedural SAH, started Eliquis. Continue at d/c.     Therapy recommendations:  HH PT, HH OT, RW, 3N1  Disposition:  home w/ w/ son  Afib, new diagnosis  Found on telemetry monitoring this morning  EKG showed A. fib episode  On Coreg 6.25  started Eliquis at day 3 (03/03/18) given small stroke and post procedural SAH. Stopped Eliquis  CHF  TTE EF 25-30%, stable.   TTE in 11/2016 showed EF 25 to 30%  Following with cardiology on Coreg and losartan  Coreg resumed  Lasix given for crackles. Lungs now clear  CCM followed in hospital  Respiratory failure, resolved  Intubated for procedure, now extubated  Tolerating well  Hypertension  Stable  Treated post IR w/ Cleviprex  Continue Coreg  BP goal normotensive  Hyperlipidemia  Lipid lowering medication PTA:  Crestor 5 mg daily  LDL 98, goal < 70  Current lipid lowering medication: Crestor 5 mg daily  Continue statin at discharge  PCOM aneurysm, right   DSA showed 77mm x 6 mm bilobed RT PCOM aneurysm  MRA showed 4x57mm bilobed right PCOM aneurysm  Incidental finding  Follow-up with Dr. [name] as outpt  Other Stroke Risk Factors  Advanced age  Hx stroke/TIA - by imaging   Other Active Problems  Leukocytosis, resolved    DISCHARGE EXAM Blood pressure 136/76, pulse 82, temperature 98.7 F (37.1 C), temperature source Oral, resp. rate 18, height 5\' 1"  (1.549 m), weight 64.4 kg, SpO2 100 %. General - Well nourished, well developed, not in distress. Cardiovascular - irregularly irregular heart rate and rhythm.  Neuro - awake, alert, orientated x 3. No aphasia, follows complex commands. Eyes middle position, EOMI,  PERRL, visual field full, mild L facial weakness. Tongue midline in mouth.  Bilateral upper extremity 4/5, bilateral lower extremity 3/5 proximal, LLE distal 4/5 and RLE distal 5/5. DTR 1+, no Babinski. Sensation symmetrical, coordination intact and gait not tested.  Discharge Diet   Regular thin liquids  DISCHARGE PLAN  Disposition:  Home  HH PT, OT  Eliquis (apixaban) daily for secondary stroke prevention.  Ongoing risk factor control by Primary Care Physician at time of discharge  Follow-up [name], DO in 2 weeks.  Follow-up Dr. Cathlean Marseilles in 4 weeks.  Follow-up in [facility] in 4 weeks, office to schedule an appointment.   45 minutes were spent preparing discharge.  Burnetta Sabin, MSN, APRN, ANVP-BC, AGPCNP-BC Advanced Practice Stroke Nurse [place] for Schedule & Pager information 03/03/2018 5:17 PM    ATTENDING NOTE: I reviewed above note and agree with the assessment and plan. Pt was seen and examined.   Patient sitting in chair, no complaints.  No acute event overnight.  Neuro stable.  Granddaughter at bedside.  Started Eliquis today for stroke prevention.  Aspirin discontinued.  Patient will follow-up with cardiology for CHF.  BP still soft, resume the Coreg and Lasix but did not resume losartan.  Patient will follow-up with cardiology for further management of medication.  Continue Crestor.  She will also follow-up with Dr. [name] for cerebral aneurysm and stroke clinic at [facility] in 4 weeks.  PT/OT recommend home health PT/OT.  Stroke resection modification.  Patient ready for discharge.  Rosalin Hawking, MD PhD Stroke Neurology 03/03/2018 6:28 PM

## 2018-03-03 NOTE — Progress Notes (Signed)
Occupational Therapy Treatment Patient Details Name: Haley Costa MRN: [id number] DOB: 05-03-1928 Today's Date: 03/03/2018    History of present illness Haley Costa is a 84 y.o. female with a history of heart failure who presents with left-sided weakness. CTA of head reveals R MI occlusion resulting in L MCA Infarct. Pt s/p revasculariation of  occluded RT MCA. PMH: pt with new diagnosis of A.FIB.   OT comments  Pt participated in ADL retraining session today with focus on bed mobility, functional ambulation in room to bathroom, toileting and grooming standing at sink. Pt is overall Min Guard assist w/ VC's for safety secondary to L sided weakness. Pt will benefit from 3:1 at d/c.   Follow Up Recommendations  Home health OT;Supervision/Assistance - 24 hour    Equipment Recommendations  3 in 1 bedside commode    Recommendations for Other Services      Precautions / Restrictions Precautions Precautions: Fall Precaution Comments: R eye blurriness (aneurysm of 74mm behind eye) - 03/03/18 Pt reports this is improving Restrictions Weight Bearing Restrictions: No       Mobility Bed Mobility Overal bed mobility: Modified Independent                Transfers Overall transfer level: Needs assistance Equipment used: Rolling walker (2 wheeled) Transfers: Sit to/from [facility] Sit to Stand: Min guard Stand pivot transfers: Min guard       General transfer comment: min guard for safety and balance     Balance Overall balance assessment: Needs assistance Sitting-balance support: Feet supported;No upper extremity supported Sitting balance-Leahy Scale: Fair     Standing balance support: Bilateral upper extremity supported Standing balance-Leahy Scale: Fair Standing balance comment: reliant on UE support with RW, & at times while standing while grooming, min guard required, fatigues easily.                           ADL either  performed or assessed with clinical judgement   ADL Overall ADL's : Needs assistance/impaired     Grooming: Wash/dry hands;Wash/dry face;Oral care;Brushing hair;Standing;Min guard                   Toilet Transfer: Min guard;Ambulation;RW Toilet Transfer Details (indicate cue type and reason): VC's for safety and hand placement. Placed 3:1 over toilet as pt stated toilet was too low. Toileting- Clothing Manipulation and Hygiene: Supervision/safety;Min guard;Sit to/from stand Toileting - Clothing Manipulation Details (indicate cue type and reason): Pt performed peri care followed by washing, drying and pulling up underwear in standing with Min Guard A     Functional mobility during ADLs: Min guard;Rolling walker;Cueing for safety General ADL Comments: Pt participated in ADL retraining session today with focus on bed mobility, functional ambulation in room to bathroom, toileting and grooming standing at sink. Pt is overall Min Guard assist & VC's for safety secondary to L sided weakness. Pt will benefit from 3:1 at d/c.     Vision Baseline Vision/History: Wears glasses Wears Glasses: Reading only Patient Visual Report: Other (comment);Blurring of vision(In R eye only - however pt reports that this is improving as of 03/03/18)     Perception     Praxis      Cognition Arousal/Alertness: Awake/alert Behavior During Therapy: WFL for tasks assessed/performed Overall Cognitive Status: Within Functional Limits for tasks assessed  Exercises     Shoulder Instructions       General Comments      Pertinent Vitals/ Pain       Pain Assessment: 0-10 Pain Score: 0-No pain Faces Pain Scale: No hurt  Home Living                                          Prior Functioning/Environment              Frequency  Min 2X/week        Progress Toward Goals  OT Goals(current goals can now be found in  the care plan section)  Progress towards OT goals: Progressing toward goals  Acute Rehab OT Goals Patient Stated Goal: home  Plan Discharge plan remains appropriate    Co-evaluation                 AM-PAC OT "6 Clicks" Daily Activity     Outcome Measure   Help from another person eating meals?: None Help from another person taking care of personal grooming?: A Little Help from another person toileting, which includes using toliet, bedpan, or urinal?: A Little Help from another person bathing (including washing, rinsing, drying)?: A Little Help from another person to put on and taking off regular upper body clothing?: None Help from another person to put on and taking off regular lower body clothing?: A Little 6 Click Score: 20    End of Session Equipment Utilized During Treatment: Gait belt;Rolling walker  OT Visit Diagnosis: Unsteadiness on feet (R26.81);Muscle weakness (generalized) (M62.81);Hemiplegia and hemiparesis Hemiplegia - Right/Left: Left Hemiplegia - dominant/non-dominant: Non-Dominant Hemiplegia - caused by: Cerebral infarction   Activity Tolerance Patient tolerated treatment well   Patient Left with family/visitor present;with call bell/phone within reach(Sitting up at [facility], son in room)   Nurse Communication Mobility status;Other (comment)(Min guard for transfers using RW; grooming and toileting completed during therapy session)        Time: 8757-9728 OT Time Calculation (min): 25 min  Charges: OT General Charges $OT Visit: 1 Visit OT Treatments $Self Care/Home Management : 23-37 mins    [place], Amy Beth Dixon, OTR/L 03/03/2018, 11:14 AM

## 2018-03-03 NOTE — Progress Notes (Signed)
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 [name]. Patient awake and alert laying in bed with no complaints at this time. Accompanied by granddaughter at bedside. States that her left eye is "better". Can spontaneously move all extremities. Right groin incision c/d/i.   Allergies: Chlorthalidone; Lisinopril; and Persantine [dipyridamole]  Medications: Prior to Admission medications   Medication Sig Start Date End Date Taking? Authorizing Provider  aspirin EC 81 MG EC tablet Take 1 tablet (81 mg total) by mouth daily. 11/26/17  Yes [name], [name], MD  carvedilol (COREG) 6.25 MG tablet Take 1 tablet (6.25 mg total) by mouth 2 (two) times daily with a meal. 02/04/18  Yes [name], [name], MD  furosemide (LASIX) 40 MG tablet Take 0.5 tablets (20 mg total) by mouth daily. 12/10/17  Yes [name], [name], MD  losartan (COZAAR) 25 MG tablet Take 1 tablet (25 mg total) by mouth daily. 11/26/17  Yes [name], [name], MD  potassium chloride 20 MEQ TBCR Take 20 mEq by mouth daily. 12/10/17  Yes [name], [name], MD  rosuvastatin (CRESTOR) 5 MG tablet Take 1 tablet (5 mg total) by mouth daily at 6 PM. 11/25/17  Yes [name], [name], MD     Vital Signs: BP 135/77 (BP Location: Right Arm)   Pulse 86   Temp 98.9 F (37.2 C) (Oral)   Resp 18   Ht 5\' 1"  (1.549 m)   Wt 141 lb 15.6 oz (64.4 kg)   SpO2 94%   BMI 26.83 kg/m   Physical Exam Vitals signs and nursing note reviewed.  Constitutional:      General: She is not in acute distress.    Appearance: Normal appearance.  Pulmonary:     Effort: Pulmonary effort is normal. No respiratory distress.  Skin:    General: Skin is warm and dry.     Comments: Right groin incision  soft without active bleeding or hematoma.  Neurological:     Mental Status: She is alert.     Comments: Alert, awake, and oriented x3. Speech and comprehension intact. PERRL bilaterally. EOMs intact bilaterally without nystagmus or subjective diplopia. Visual fields not assessed. No facial asymmetry. Tongue midline. Can spontaneously move all extremities. No pronator drift. Fine motor and coordination intact and symmetric. Gait not assessed. Romberg not assessed. Heel to toe not assessed. Distal pulses 2+ bilaterally.   Psychiatric:        Mood and Affect: Mood normal.        Behavior: Behavior normal.        Thought Content: Thought content normal.        Judgment: Judgment normal.     Imaging: Ct Angio Head W Or Wo Contrast  Result Date: 02/28/2018 CLINICAL DATA:  Slurred speech, weakness. EXAM: CT ANGIOGRAPHY HEAD AND NECK CT PERFUSION BRAIN TECHNIQUE: Multidetector CT imaging of the head and neck was performed using the standard protocol during bolus administration of intravenous contrast. Multiplanar CT image reconstructions and MIPs were obtained to evaluate the vascular anatomy. Carotid stenosis measurements (when applicable) are obtained utilizing NASCET criteria, using the distal internal carotid diameter as the denominator. Multiphase CT imaging of the brain was performed following IV bolus contrast injection. Subsequent parametric perfusion maps were calculated using RAPID software. CONTRAST:  143mL ISOVUE-370 IOPAMIDOL (ISOVUE-370)  INJECTION 76% COMPARISON:  CT HEAD February 28, 2018. FINDINGS: CTA NECK FINDINGS: AORTIC ARCH: Normal appearance of the thoracic arch, normal branch pattern. Moderate calcific atherosclerosis aortic arch. The origins of the innominate, left Common carotid artery and subclavian artery are widely patent. RIGHT CAROTID SYSTEM: Common carotid artery is patent. Moderate calcific atherosclerosis resulting in less than 50% stenosis by NASCET criteria.  Patent internal carotid artery. LEFT CAROTID SYSTEM: Common carotid artery is patent. Severe calcific atherosclerosis resulting in less than 50% stenosis by NASCET criteria. Patent internal carotid artery. VERTEBRAL ARTERIES:Left vertebral artery is dominant. Normal appearance of the vertebral arteries, widely patent. SKELETON: No acute osseous process though bone windows have not been submitted. Surgical clips LEFT axilla. OTHER NECK: Soft tissues of the neck are nonacute though, not tailored for evaluation. UPPER CHEST: Small bilateral pleural effusions. Interlobular septal thickening. Diffuse bronchial wall thickening seen with pulmonary edema, bronchitis. Mild debris distended stomach. Centrilobular emphysema. CTA HEAD FINDINGS: ANTERIOR CIRCULATION: Patent cervical internal carotid arteries, petrous, cavernous and supra clinoid internal carotid arteries. 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 Patent anterior communicating artery. Proximal RIGHT M1 occlusion. Good collaterals by single-phase CTA. Patent anterior a LEFT nd middle cerebral arteries, mild luminal irregularity compatible with atherosclerosis. No large vessel occlusion, significant stenosis, contrast extravasation. POSTERIOR CIRCULATION: Patent vertebral arteries, vertebrobasilar junction and basilar artery, as well as main branch vessels. Patent posterior cerebral arteries. No large vessel occlusion, significant stenosis, contrast extravasation or aneurysm. VENOUS SINUSES: Major dural venous sinuses are patent though not tailored for evaluation on this angiographic examination. ANATOMIC VARIANTS: Supernumerary anterior cerebral artery. Hypoplastic RIGHT A1 segment. DELAYED PHASE: Not performed. MIP images reviewed. CT Brain Perfusion Findings: CBF (<30%) Volume: 62mL Perfusion (Tmax>6.0s) volume: 146mL Mismatch Volume: 135mL Infarction Location:RIGHT frontotemporal parietal lobes. IMPRESSION: CTA NECK: 1. No hemodynamically  significant stenosis ICA's. Patent vertebral arteries. 2. Small pleural effusions and findings of pulmonary edema. CTA HEAD: 1. Emergent RIGHT M1 occlusion. Good collaterals by single-phase CTA. 2. 3 mm RIGHT paraophthalmic artery aneurysm. Neuro-Interventional Radiology consultation is suggested to evaluate the appropriateness of potential treatment. Non-emergent evaluation can be arranged by calling [phone] during usual hours. Emergency evaluation can be requested by paging [phone]. CT PERFUSION: 1. RIGHT MCA penumbra. Acute findings discussed with and reconfirmed by Dr.MCNEILL [facility] on 02/28/2018 at 3:50 am. Aortic Atherosclerosis (ICD10-I70.0). Emphysema (ICD10-J43.9). Electronically Signed   By: Elon Alas M.D.   On: 02/28/2018 03:50   Dg Abd 1 View  Result Date: 02/28/2018 CLINICAL DATA:  Orogastric tube placement. EXAM: ABDOMEN - 1 VIEW COMPARISON:  Lumbar spine radiographs 10/09/2017. FINDINGS: 0733 hours. Orogastric tube projects below the diaphragm, tip nonvisualized, projecting over the left iliac crest. The visualized abdomen is nearly gasless. There are bilateral pleural effusions with diffuse bilateral pulmonary opacities, likely edema. Degenerative changes are present throughout the thoracolumbar spine. IMPRESSION: Enteric tube projects into the expected location of the stomach. Electronically Signed   By: Richardean Sale M.D.   On: 02/28/2018 08:37   Ct Angio Neck W Or Wo Contrast  Result Date: 02/28/2018 CLINICAL DATA:  Slurred speech, weakness. EXAM: CT ANGIOGRAPHY HEAD AND NECK CT PERFUSION BRAIN TECHNIQUE: Multidetector CT imaging of the head and neck was performed using the standard protocol during bolus administration of intravenous contrast. Multiplanar CT image reconstructions and MIPs were obtained to evaluate the vascular anatomy. Carotid stenosis measurements (when applicable) are obtained utilizing NASCET criteria, using the distal internal carotid  diameter as the denominator. Multiphase CT imaging of the brain  was performed following IV bolus contrast injection. Subsequent parametric perfusion maps were calculated using RAPID software. CONTRAST:  110mL ISOVUE-370 IOPAMIDOL (ISOVUE-370) INJECTION 76% COMPARISON:  CT HEAD February 28, 2018. FINDINGS: CTA NECK FINDINGS: AORTIC ARCH: Normal appearance of the thoracic arch, normal branch pattern. Moderate calcific atherosclerosis aortic arch. The origins of the innominate, left Common carotid artery and subclavian artery are widely patent. RIGHT CAROTID SYSTEM: Common carotid artery is patent. Moderate calcific atherosclerosis resulting in less than 50% stenosis by NASCET criteria. Patent internal carotid artery. LEFT CAROTID SYSTEM: Common carotid artery is patent. Severe calcific atherosclerosis resulting in less than 50% stenosis by NASCET criteria. Patent internal carotid artery. VERTEBRAL ARTERIES:Left vertebral artery is dominant. Normal appearance of the vertebral arteries, widely patent. SKELETON: No acute osseous process though bone windows have not been submitted. Surgical clips LEFT axilla. OTHER NECK: Soft tissues of the neck are nonacute though, not tailored for evaluation. UPPER CHEST: Small bilateral pleural effusions. Interlobular septal thickening. Diffuse bronchial wall thickening seen with pulmonary edema, bronchitis. Mild debris distended stomach. Centrilobular emphysema. CTA HEAD FINDINGS: ANTERIOR CIRCULATION: Patent cervical internal carotid arteries, petrous, cavernous and supra clinoid internal carotid arteries. 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 Patent anterior communicating artery. Proximal RIGHT M1 occlusion. Good collaterals by single-phase CTA. Patent anterior a LEFT nd middle cerebral arteries, mild luminal irregularity compatible with atherosclerosis. No large vessel occlusion, significant stenosis, contrast extravasation. POSTERIOR CIRCULATION: Patent  vertebral arteries, vertebrobasilar junction and basilar artery, as well as main branch vessels. Patent posterior cerebral arteries. No large vessel occlusion, significant stenosis, contrast extravasation or aneurysm. VENOUS SINUSES: Major dural venous sinuses are patent though not tailored for evaluation on this angiographic examination. ANATOMIC VARIANTS: Supernumerary anterior cerebral artery. Hypoplastic RIGHT A1 segment. DELAYED PHASE: Not performed. MIP images reviewed. CT Brain Perfusion Findings: CBF (<30%) Volume: 67mL Perfusion (Tmax>6.0s) volume: 174mL Mismatch Volume: 17mL Infarction Location:RIGHT frontotemporal parietal lobes. IMPRESSION: CTA NECK: 1. No hemodynamically significant stenosis ICA's. Patent vertebral arteries. 2. Small pleural effusions and findings of pulmonary edema. CTA HEAD: 1. Emergent RIGHT M1 occlusion. Good collaterals by single-phase CTA. 2. 3 mm RIGHT paraophthalmic artery aneurysm. Neuro-Interventional Radiology consultation is suggested to evaluate the appropriateness of potential treatment. Non-emergent evaluation can be arranged by calling [phone] during usual hours. Emergency evaluation can be requested by paging [phone]. CT PERFUSION: 1. RIGHT MCA penumbra. Acute findings discussed with and reconfirmed by Dr.MCNEILL [facility] on 02/28/2018 at 3:50 am. Aortic Atherosclerosis (ICD10-I70.0). Emphysema (ICD10-J43.9). Electronically Signed   By: Elon Alas M.D.   On: 02/28/2018 03:50   Mr Jodene Nam Head Wo Contrast  Result Date: 02/28/2018 CLINICAL DATA:  Acute RIGHT M1 occlusion. Slurred speech with LEFT-sided weakness. TICI3 revascularization. EXAM: MRI HEAD WITHOUT CONTRAST MRA HEAD WITHOUT CONTRAST TECHNIQUE: Multiplanar, multiecho pulse sequences of the brain and surrounding structures were obtained without intravenous contrast. Angiographic images of the head were obtained using MRA technique without contrast. COMPARISON:  CT head 02/28/2018. CTA head  neck 02/28/2018. CT perfusion 02/28/2018. postprocedural CT on the angiographic table, 02/28/2018. FINDINGS: MRI HEAD FINDINGS Brain: Patchy areas of restricted diffusion RIGHT centrum semiovale periventricular white matter, also involving the RIGHT putamen, corresponding low ADC, relatively minor compared with the large penumbra on prior CT perfusion, consistent with acute RIGHT MCA territory infarction. No acute parenchymal hemorrhage, mass lesion, hydrocephalus, or extra-axial fluid. Faint increased/asymmetric FLAIR signal in the subarachnoid space, posterior sylvian fissure on the RIGHT and over the convexity, corresponds to subarachnoid contrast extravasation on postprocedural CT, consistent with  small volume subarachnoid hemorrhage. Vascular: Reported separately. Skull and upper cervical spine: Normal marrow signal. Sinuses/Orbits: No acute findings.  RIGHT cataract extraction. Other: None. MRA HEAD FINDINGS Internal carotid arteries are widely patent. Basilar artery widely patent with LEFT vertebral dominant. Previous RIGHT M1 occlusion has been revascularized, with excellent and symmetric M2 and M3 opacification. Bilobed RIGHT PCOM/paraophthalmic aneurysm, 4 x 6 mm, inferiorly and posteriorly projecting, unchanged compared to prior imaging. IMPRESSION: Small areas of restricted diffusion affecting the RIGHT putamen and RIGHT periventricular white matter, much smaller than the previously identified penumbra, consistent with acute RIGHT MCA territory infarction. These are nonhemorrhagic. Small volume subarachnoid hemorrhage in the RIGHT posterior sylvian fissure and over the convexity, [place] 3C class of postprocedural intracranial hemorrhage. Bilobed RIGHT PCOM/paraophthalmic aneurysm, 4 x 6 mm, stable. Electronically Signed   By: Staci Righter M.D.   On: 02/28/2018 15:35   Mr Brain Wo Contrast  Result Date: 02/28/2018 CLINICAL DATA:  Acute RIGHT M1 occlusion. Slurred speech with LEFT-sided weakness.  TICI3 revascularization. EXAM: MRI HEAD WITHOUT CONTRAST MRA HEAD WITHOUT CONTRAST TECHNIQUE: Multiplanar, multiecho pulse sequences of the brain and surrounding structures were obtained without intravenous contrast. Angiographic images of the head were obtained using MRA technique without contrast. COMPARISON:  CT head 02/28/2018. CTA head neck 02/28/2018. CT perfusion 02/28/2018. postprocedural CT on the angiographic table, 02/28/2018. FINDINGS: MRI HEAD FINDINGS Brain: Patchy areas of restricted diffusion RIGHT centrum semiovale periventricular white matter, also involving the RIGHT putamen, corresponding low ADC, relatively minor compared with the large penumbra on prior CT perfusion, consistent with acute RIGHT MCA territory infarction. No acute parenchymal hemorrhage, mass lesion, hydrocephalus, or extra-axial fluid. Faint increased/asymmetric FLAIR signal in the subarachnoid space, posterior sylvian fissure on the RIGHT and over the convexity, corresponds to subarachnoid contrast extravasation on postprocedural CT, consistent with small volume subarachnoid hemorrhage. Vascular: Reported separately. Skull and upper cervical spine: Normal marrow signal. Sinuses/Orbits: No acute findings.  RIGHT cataract extraction. Other: None. MRA HEAD FINDINGS Internal carotid arteries are widely patent. Basilar artery widely patent with LEFT vertebral dominant. Previous RIGHT M1 occlusion has been revascularized, with excellent and symmetric M2 and M3 opacification. Bilobed RIGHT PCOM/paraophthalmic aneurysm, 4 x 6 mm, inferiorly and posteriorly projecting, unchanged compared to prior imaging. IMPRESSION: Small areas of restricted diffusion affecting the RIGHT putamen and RIGHT periventricular white matter, much smaller than the previously identified penumbra, consistent with acute RIGHT MCA territory infarction. These are nonhemorrhagic. Small volume subarachnoid hemorrhage in the RIGHT posterior sylvian fissure and over  the convexity, [place] 3C class of postprocedural intracranial hemorrhage. Bilobed RIGHT PCOM/paraophthalmic aneurysm, 4 x 6 mm, stable. Electronically Signed   By: Staci Righter M.D.   On: 02/28/2018 15:35   Ct Cerebral Perfusion W Contrast  Result Date: 02/28/2018 CLINICAL DATA:  Slurred speech, weakness. EXAM: CT ANGIOGRAPHY HEAD AND NECK CT PERFUSION BRAIN TECHNIQUE: Multidetector CT imaging of the head and neck was performed using the standard protocol during bolus administration of intravenous contrast. Multiplanar CT image reconstructions and MIPs were obtained to evaluate the vascular anatomy. Carotid stenosis measurements (when applicable) are obtained utilizing NASCET criteria, using the distal internal carotid diameter as the denominator. Multiphase CT imaging of the brain was performed following IV bolus contrast injection. Subsequent parametric perfusion maps were calculated using RAPID software. CONTRAST:  181mL ISOVUE-370 IOPAMIDOL (ISOVUE-370) INJECTION 76% COMPARISON:  CT HEAD February 28, 2018. FINDINGS: CTA NECK FINDINGS: AORTIC ARCH: Normal appearance of the thoracic arch, normal branch pattern. Moderate calcific atherosclerosis aortic arch. The origins  of the innominate, left Common carotid artery and subclavian artery are widely patent. RIGHT CAROTID SYSTEM: Common carotid artery is patent. Moderate calcific atherosclerosis resulting in less than 50% stenosis by NASCET criteria. Patent internal carotid artery. LEFT CAROTID SYSTEM: Common carotid artery is patent. Severe calcific atherosclerosis resulting in less than 50% stenosis by NASCET criteria. Patent internal carotid artery. VERTEBRAL ARTERIES:Left vertebral artery is dominant. Normal appearance of the vertebral arteries, widely patent. SKELETON: No acute osseous process though bone windows have not been submitted. Surgical clips LEFT axilla. OTHER NECK: Soft tissues of the neck are nonacute though, not tailored for evaluation.  UPPER CHEST: Small bilateral pleural effusions. Interlobular septal thickening. Diffuse bronchial wall thickening seen with pulmonary edema, bronchitis. Mild debris distended stomach. Centrilobular emphysema. CTA HEAD FINDINGS: ANTERIOR CIRCULATION: Patent cervical internal carotid arteries, petrous, cavernous and supra clinoid internal carotid arteries. 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 Patent anterior communicating artery. Proximal RIGHT M1 occlusion. Good collaterals by single-phase CTA. Patent anterior a LEFT nd middle cerebral arteries, mild luminal irregularity compatible with atherosclerosis. No large vessel occlusion, significant stenosis, contrast extravasation. POSTERIOR CIRCULATION: Patent vertebral arteries, vertebrobasilar junction and basilar artery, as well as main branch vessels. Patent posterior cerebral arteries. No large vessel occlusion, significant stenosis, contrast extravasation or aneurysm. VENOUS SINUSES: Major dural venous sinuses are patent though not tailored for evaluation on this angiographic examination. ANATOMIC VARIANTS: Supernumerary anterior cerebral artery. Hypoplastic RIGHT A1 segment. DELAYED PHASE: Not performed. MIP images reviewed. CT Brain Perfusion Findings: CBF (<30%) Volume: 8mL Perfusion (Tmax>6.0s) volume: 123mL Mismatch Volume: 180mL Infarction Location:RIGHT frontotemporal parietal lobes. IMPRESSION: CTA NECK: 1. No hemodynamically significant stenosis ICA's. Patent vertebral arteries. 2. Small pleural effusions and findings of pulmonary edema. CTA HEAD: 1. Emergent RIGHT M1 occlusion. Good collaterals by single-phase CTA. 2. 3 mm RIGHT paraophthalmic artery aneurysm. Neuro-Interventional Radiology consultation is suggested to evaluate the appropriateness of potential treatment. Non-emergent evaluation can be arranged by calling [phone] during usual hours. Emergency evaluation can be requested by paging [phone]. CT PERFUSION: 1.  RIGHT MCA penumbra. Acute findings discussed with and reconfirmed by Dr.[facility]ILL [facility] on 02/28/2018 at 3:50 am. Aortic Atherosclerosis (ICD10-I70.0). Emphysema (ICD10-J43.9). Electronically Signed   By: Elon Alas M.D.   On: 02/28/2018 03:50   Dg Chest Port 1 View  Result Date: 03/02/2018 CLINICAL DATA:  Recent admission for congestive heart failure. EXAM: PORTABLE CHEST 1 VIEW COMPARISON:  02/28/2018 FINDINGS: Endotracheal tube and nasogastric tube have been removed. There are bilateral effusions with bilateral lower lobe atelectasis and or pneumonia. Upper lungs are clear. IMPRESSION: Bilateral effusions and bilateral lower lobe atelectasis and or pneumonia. Electronically Signed   By: Nelson Chimes M.D.   On: 03/02/2018 10:22   Dg Chest Port 1 View  Result Date: 02/28/2018 CLINICAL DATA:  Right MCA occlusion.  Intubation. EXAM: PORTABLE CHEST 1 VIEW COMPARISON:  02/19/2018. FINDINGS: Two views. 0650 hours. Endotracheal tube tip is in the mid trachea. Enteric tube projects to the level of the left lung base. There is cardiomegaly and aortic atherosclerosis. There are bilateral pleural effusions with mild diffuse pulmonary edema. No evidence of pneumothorax. IMPRESSION: 1. Satisfactory position of the endotracheal tube. 2. Enteric tube projects to the level of the left lung base. The patient has already had a follow-up abdominal radiograph at 0733 hours demonstrating repositioning of the tube in the stomach. 3. Pulmonary edema with bilateral pleural effusions. Electronically Signed   By: Richardean Sale M.D.   On: 02/28/2018 08:39   Ct Head  Code Stroke Wo Contrast  Result Date: 02/28/2018 CLINICAL DATA:  Code stroke. Aphasic. History of breast cancer, hypertension. EXAM: CT HEAD WITHOUT CONTRAST TECHNIQUE: Contiguous axial images were obtained from the base of the skull through the vertex without intravenous contrast. COMPARISON:  None. FINDINGS: BRAIN: No intraparenchymal hemorrhage,  mass effect nor midline shift. The ventricles and sulci are normal for age. Minimal supratentorial white matter hypodensities less than expected for patient's age, though non-specific are most compatible with chronic small vessel ischemic disease. Focal blurring of the LEFT occipital gray-white matter junction. No abnormal extra-axial fluid collections. Basal cisterns are patent. VASCULAR: Mild calcific atherosclerosis of the carotid siphons. SKULL: No skull fracture. 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 No significant scalp soft tissue swelling. SINUSES/ORBITS: Trace paranasal sinus mucosal thickening. Small RIGHT mastoid effusion without air cell coalescence.The included ocular globes and orbital contents are non-suspicious. Status post RIGHT ocular lens implant. OTHER: None. ASPECTS [facility] Stroke Program Early CT Score) - Ganglionic level infarction (caudate, lentiform nuclei, internal capsule, insula, M1-M3 cortex): 7 - Supraganglionic infarction (M4-M6 cortex): 3 Total score (0-10 with 10 being normal): 10 IMPRESSION: 1. No acute intracranial process. 2. ASPECTS is 10. 3. Small LEFT occipital lobe infarct, likely chronic. Otherwise negative CT HEAD without contrast for age. 4. Critical Value/emergent results text paged to [place], Neurology via [employer] secure system on 02/28/2018 at 3:23 am, including interpreting physician's phone number. Electronically Signed   By: Elon Alas M.D.   On: 02/28/2018 03:24    Labs:  CBC: Recent Labs    02/28/18 0303 02/28/18 0305 03/01/18 0158 03/02/18 0712 03/03/18 0307  WBC 8.0  --  12.9* 7.8 7.7  HGB 12.8 13.3 10.6* 11.2* 10.5*  HCT 40.4 39.0 32.8* 35.3* 31.9*  PLT 227  --  209 189 179    COAGS: Recent Labs    02/28/18 0303  INR 1.02  APTT 29    BMP: Recent Labs    02/28/18 0303 02/28/18 0305 03/01/18 0158 03/02/18 0712 03/03/18 0307  NA 134* 134* 138 140 141  K 4.0 4.0 3.7 3.6 3.7  CL 97* 99 104  107 108  CO2 21*  --  22 21* 23  GLUCOSE 191* 187* 105* 83 91  BUN 25* 26* 21 25* 27*  CALCIUM 9.2  --  8.5* 8.6* 8.5*  CREATININE 1.18* 1.00 1.00 0.99 1.00  GFRNONAA 42*  --  51* 51* 51*  GFRAA 48*  --  59* 59* 59*    LIVER FUNCTION TESTS: Recent Labs    12/09/17 1120 02/28/18 0303 03/01/18 0158  BILITOT 1.3* 1.8* 1.9*  AST 26 54* 28  ALT 17 57* 36  ALKPHOS 87 92 72  PROT 6.2* 6.3* 5.2*  ALBUMIN 3.5 3.5 2.8*    Assessment and Plan:  Right MCA M1 segment occlusion s/p emergent mechanical thrombectomy achieving a TICI 3 revascularization 02/28/2018 by Dr. [name]. Patient's condition stable- still spontaneously moves all extremities. Right groin incision stable. Plan to follow-up with Dr. [name] in clinic 4 weeks after discharge. Appreciate and agree with neurology management. Please call IR with questions/concerns.   Electronically Signed: Earley Abide, PA-C 03/03/2018, 9:37 AM   I spent a total of 25 Minutes at the the patient's bedside AND on the patient's hospital floor or unit, greater than 50% of which was counseling/coordinating care for right MCA M1 segment occlusion s/p revascularization.

## 2018-03-03 NOTE — Care Management Important Message (Signed)
Important Message  Patient Details  Name: Haley Costa MRN: [id number] Date of Birth: 03-31-1928   Medicare Important Message Given:  Yes    Taliana Mersereau 03/03/2018, 3:50 PM

## 2018-03-03 NOTE — Progress Notes (Signed)
ANTICOAGULATION CONSULT NOTE - Initial Consult  Pharmacy Consult for apixaban Indication: atrial fibrillation  Allergies  Allergen Reactions  . Chlorthalidone Other (See Comments)  . Lisinopril Swelling    Eye  And cheek swelling  . Persantine [Dipyridamole] Other (See Comments)    Patient Measurements: Height: 5\' 1"  (154.9 cm) Weight: 141 lb 15.6 oz (64.4 kg) IBW/kg (Calculated) : 47.8  Vital Signs: Temp: 98.9 F (37.2 C) (12/17 0357) Temp Source: Oral (12/17 0357) BP: 135/77 (12/17 0357) Pulse Rate: 86 (12/17 0357)  Labs: Recent Labs    03/01/18 0158 03/02/18 0712 03/03/18 0307  HGB 10.6* 11.2* 10.5*  HCT 32.8* 35.3* 31.9*  PLT 209 189 179  CREATININE 1.00 0.99 1.00    Estimated Creatinine Clearance: 34 mL/min (by C-G formula based on SCr of 1 mg/dL).   Medical History: Past Medical History:  Diagnosis Date  . Breast cancer, left breast ([place]) 1992   S/P mastectomy & chemo  . CHF (congestive heart failure) ([place])   . Chronic back pain    "lower and middle back; I've had several broken vertebrae" (12/09/2017)  . Heart murmur   . High cholesterol   . History of kidney stones   . Hypertension   . LBBB (left bundle branch block)    Patient states that she was told by her PCP that she has left bundle blockage  . Osteoarthritis    "some of the joints" (12/09/2017)  . Pneumonia    "when I was a child" (12/09/2017)  . PONV (postoperative nausea and vomiting)     Medications:  Infusions:    Assessment: 38 YOF with Rt MCA (IR thrombectomy alone) and newly diagnosed AF not on AC PTA, given small stroke and post procedural [facility] pharmacy consulted to start [facility] 3 days out (12/17). Age > 80, Wt > 60kg, SCr < 1.5, qualifies for full dose  Goal of Therapy:  Monitor platelets by anticoagulation protocol: Yes   Plan:  Apixaban 5mg  PO BID Monitor renal function, s/s bleeding  Bertis Ruddy, PharmD Clinical Pharmacist Please check AMION for all [place]  numbers 03/03/2018 11:43 AM

## 2018-03-03 NOTE — Care Management Note (Signed)
Case Management Note  Patient Details  Name: ERRIN WHITELAW MRN: [id number] Date of Birth: 1928/11/07  Subjective/Objective:   Presented with left sided weakness.  Resides with son.                 Coralie Keens (Granddaughter) Ida Milbrath (Son)    [phone]     PCP: [name]  Action/Plan: Transition to home with home health services.  Son to provide transportation to home.  Expected Discharge Date:  03/03/18               Expected Discharge Plan:     In-House Referral:     Discharge planning Services     Post Acute Care Choice:    Choice offered to:   patient  DME Arranged:   rolling walker, BSC DME Agency:   [facility]  HH Arranged:   PT,OT [place] Agency:   [facility]  Status of Service:   completed  If discussed at [place] of Stay Meetings, dates discussed:    Additional Comments:  Sharin Mons, RN 03/03/2018, 2:26 PM

## 2018-03-04 ENCOUNTER — Encounter (HOSPITAL_COMMUNITY): Payer: Self-pay | Admitting: Interventional Radiology

## 2018-03-04 ENCOUNTER — Telehealth: Payer: Self-pay | Admitting: Cardiology

## 2018-03-04 NOTE — Telephone Encounter (Signed)
Advised granddaughter that echo appointment for 04/01/18 could be canceled since she did one on 03/01/18 while inpatient. Verbalized understanding.

## 2018-03-04 NOTE — Telephone Encounter (Signed)
Patient was in hospital 02/28/18 due to stroke. Patient had echo while in the hospital and so granddaughter would like to know if they can cancel the appt on 04/01/17 to have an echo done with [place]. Please call to advise.

## 2018-03-05 LAB — GLUCOSE, CAPILLARY
GLUCOSE-CAPILLARY: 75 mg/dL (ref 70–99)
Glucose-Capillary: 78 mg/dL (ref 70–99)
Glucose-Capillary: 78 mg/dL (ref 70–99)
Glucose-Capillary: 77 mg/dL (ref 70–99)
Glucose-Capillary: 75 mg/dL (ref 70–99)

## 2018-03-06 DIAGNOSIS — I110 Hypertensive heart disease with heart failure: Secondary | ICD-10-CM | POA: Diagnosis not present

## 2018-03-06 DIAGNOSIS — E785 Hyperlipidemia, unspecified: Secondary | ICD-10-CM | POA: Diagnosis not present

## 2018-03-06 DIAGNOSIS — I4891 Unspecified atrial fibrillation: Secondary | ICD-10-CM | POA: Diagnosis not present

## 2018-03-06 DIAGNOSIS — I69354 Hemiplegia and hemiparesis following cerebral infarction affecting left non-dominant side: Secondary | ICD-10-CM | POA: Diagnosis not present

## 2018-03-06 DIAGNOSIS — I509 Heart failure, unspecified: Secondary | ICD-10-CM | POA: Diagnosis not present

## 2018-03-06 DIAGNOSIS — Z7901 Long term (current) use of anticoagulants: Secondary | ICD-10-CM | POA: Diagnosis not present

## 2018-03-09 DIAGNOSIS — Z7901 Long term (current) use of anticoagulants: Secondary | ICD-10-CM | POA: Diagnosis not present

## 2018-03-09 DIAGNOSIS — E785 Hyperlipidemia, unspecified: Secondary | ICD-10-CM | POA: Diagnosis not present

## 2018-03-09 DIAGNOSIS — I69354 Hemiplegia and hemiparesis following cerebral infarction affecting left non-dominant side: Secondary | ICD-10-CM | POA: Diagnosis not present

## 2018-03-09 DIAGNOSIS — I110 Hypertensive heart disease with heart failure: Secondary | ICD-10-CM | POA: Diagnosis not present

## 2018-03-09 DIAGNOSIS — I4891 Unspecified atrial fibrillation: Secondary | ICD-10-CM | POA: Diagnosis not present

## 2018-03-09 DIAGNOSIS — I509 Heart failure, unspecified: Secondary | ICD-10-CM | POA: Diagnosis not present

## 2018-03-09 NOTE — Telephone Encounter (Signed)
No need for repeat echo in January.   Thank you. [name]

## 2018-03-11 ENCOUNTER — Emergency Department (HOSPITAL_COMMUNITY): Payer: Medicare Other

## 2018-03-11 ENCOUNTER — Other Ambulatory Visit: Payer: Self-pay

## 2018-03-11 ENCOUNTER — Emergency Department (HOSPITAL_COMMUNITY)
Admission: EM | Admit: 2018-03-11 | Discharge: 2018-03-11 | Disposition: A | Discharge status: Home / Self Care | Payer: Medicare Other | Attending: Emergency Medicine | Admitting: Emergency Medicine

## 2018-03-11 DIAGNOSIS — R079 Chest pain, unspecified: Secondary | ICD-10-CM | POA: Diagnosis not present

## 2018-03-11 DIAGNOSIS — Z7901 Long term (current) use of anticoagulants: Secondary | ICD-10-CM | POA: Diagnosis not present

## 2018-03-11 DIAGNOSIS — R05 Cough: Secondary | ICD-10-CM | POA: Insufficient documentation

## 2018-03-11 DIAGNOSIS — R072 Precordial pain: Secondary | ICD-10-CM | POA: Insufficient documentation

## 2018-03-11 DIAGNOSIS — I110 Hypertensive heart disease with heart failure: Secondary | ICD-10-CM | POA: Diagnosis not present

## 2018-03-11 DIAGNOSIS — R000 Tachycardia, unspecified: Secondary | ICD-10-CM | POA: Diagnosis not present

## 2018-03-11 DIAGNOSIS — R0789 Other chest pain: Secondary | ICD-10-CM | POA: Diagnosis not present

## 2018-03-11 DIAGNOSIS — I447 Left bundle-branch block, unspecified: Secondary | ICD-10-CM | POA: Diagnosis not present

## 2018-03-11 DIAGNOSIS — Z853 Personal history of malignant neoplasm of breast: Secondary | ICD-10-CM | POA: Diagnosis not present

## 2018-03-11 DIAGNOSIS — I509 Heart failure, unspecified: Secondary | ICD-10-CM | POA: Insufficient documentation

## 2018-03-11 DIAGNOSIS — Z79899 Other long term (current) drug therapy: Secondary | ICD-10-CM | POA: Diagnosis not present

## 2018-03-11 LAB — CBC
HCT: 35.2 % — ABNORMAL LOW (ref 36.0–46.0)
Hemoglobin: 11.4 g/dL — ABNORMAL LOW (ref 12.0–15.0)
MCH: 31.5 pg (ref 26.0–34.0)
MCHC: 32.4 g/dL (ref 30.0–36.0)
MCV: 97.2 fL (ref 80.0–100.0)
Platelets: 267 10*3/uL (ref 150–400)
RBC: 3.62 MIL/uL — AB (ref 3.87–5.11)
RDW: 14.3 % (ref 11.5–15.5)
WBC: 6.4 10*3/uL (ref 4.0–10.5)
nRBC: 0 % (ref 0.0–0.2)

## 2018-03-11 LAB — I-STAT TROPONIN, ED: Troponin i, poc: 0 ng/mL (ref 0.00–0.08)

## 2018-03-11 LAB — BASIC METABOLIC PANEL
ANION GAP: 13 (ref 5–15)
BUN: 16 mg/dL (ref 8–23)
CO2: 25 mmol/L (ref 22–32)
Calcium: 8.9 mg/dL (ref 8.9–10.3)
Chloride: 98 mmol/L (ref 98–111)
Creatinine, Ser: 1.06 mg/dL — ABNORMAL HIGH (ref 0.44–1.00)
GFR calc Af Amer: 55 mL/min — ABNORMAL LOW (ref >60)
GFR calc non Af Amer: 47 mL/min — ABNORMAL LOW (ref >60)
Glucose, Bld: 129 mg/dL — ABNORMAL HIGH (ref 70–99)
Potassium: 3.7 mmol/L (ref 3.5–5.1)
Sodium: 136 mmol/L (ref 135–145)

## 2018-03-11 MED ORDER — NITROGLYCERIN 0.4 MG SL SUBL: 0.4000 mg | SUBLINGUAL_TABLET | SUBLINGUAL | 0 refills | Status: DC | PRN

## 2018-03-11 NOTE — ED Provider Notes (Signed)
[facility] EMERGENCY DEPARTMENT Provider Note   CSN: [id number] Arrival date & time: 03/11/18  0145    History   Chief Complaint Chief Complaint  Patient presents with  . Chest Pain    HPI Haley Costa is a 84 y.o. female.  The history is provided by the patient and a relative.  Chest Pain   This is a new problem. The current episode started 3 to 5 hours ago. The problem occurs constantly. The problem has been resolved. The pain is present in the substernal region. The pain is moderate. Quality: Tightness. The pain does not radiate. Associated symptoms include cough. Pertinent negatives include no abdominal pain, no diaphoresis, no fever, no shortness of breath, no syncope and no vomiting. She has tried nitroglycerin (ASA) for the symptoms. The treatment provided significant relief. Risk factors include being elderly.  Her past medical history is significant for strokes.    Patient with significant history of recent stroke, atrial fibrillation on anticoagulation.  She presents with chest tightness.  She reports earlier in the night she had an episode of chest tightness that lasted about an hour, that it resolved with nitroglycerin and aspirin.  She denied any associated symptoms, denied shortness of breath/diaphoresis/vomiting.  She is now back to baseline.  She denies have this pain recently. Past Medical History:  Diagnosis Date  . Breast cancer, left breast ([place]) 1992   S/P mastectomy & chemo  . CHF (congestive heart failure) ([place])   . Chronic back pain    "lower and middle back; I've had several broken vertebrae" (12/09/2017)  . Heart murmur   . High cholesterol   . History of kidney stones   . Hypertension   . LBBB (left bundle branch block)    Patient states that she was told by her PCP that she has left bundle blockage  . Osteoarthritis    "some of the joints" (12/09/2017)  . Pneumonia    "when I was a child" (12/09/2017)  . PONV (postoperative  nausea and vomiting)     Patient Active Problem List   Diagnosis Date Noted  . Hyperlipidemia LDL goal <70 03/03/2018  . 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 Stroke ([place]) 02/28/2018  . Acute ischemic stroke [facility]), s/p mechanical throbmectomy 02/28/2018  . 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 AF (paroxysmal atrial fibrillation) ([place])   . Acute on chronic respiratory failure ([place])   . Hyponatremia 12/09/2017  . Acute exacerbation of CHF (congestive heart failure) ([place]) 11/22/2017  . Chest pain 11/21/2017  . Acute CHF (congestive heart failure) ([place]) 11/21/2017  . HTN (hypertension) 11/21/2017    Past Surgical History:  Procedure Laterality Date  . APPENDECTOMY    . BACK SURGERY    . CATARACT EXTRACTION W/ INTRAOCULAR LENS IMPLANT Right   . FIXATION KYPHOPLASTY THORACIC SPINE  2014  . IR CT HEAD LTD  02/28/2018  . IR PERCUTANEOUS ART THROMBECTOMY/INFUSION INTRACRANIAL INC DIAG ANGIO  02/28/2018  . MASTECTOMY Left 1992  . RADIOLOGY WITH ANESTHESIA N/A 02/28/2018   Procedure: RADIOLOGY WITH ANESTHESIA;  Surgeon: [name], MD;  Location: [place];  Service: Radiology;  Laterality: N/A;     OB History   No obstetric history on file.      Home Medications    Prior to Admission medications   Medication Sig Start Date End Date Taking? Authorizing Provider  apixaban (ELIQUIS) 5 MG TABS tablet Take 1 tablet (5 mg total) by mouth 2 (two) times  daily. 03/03/18   Donzetta Starch, NP  carvedilol (COREG) 6.25 MG tablet Take 1 tablet (6.25 mg total) by mouth 2 (two) times daily with a meal. 02/04/18   [name], [name], MD  furosemide (LASIX) 40 MG tablet Take 0.5 tablets (20 mg total) by mouth daily. 12/10/17   [name], [name], MD  losartan (COZAAR) 25 MG tablet Take 1 tablet (25 mg total) by mouth daily. 11/26/17   [name], [name], MD  potassium chloride 20 MEQ TBCR Take 20 mEq by mouth daily. 12/10/17   [name], [name], MD  rosuvastatin  (CRESTOR) 5 MG tablet Take 1 tablet (5 mg total) by mouth daily at 6 PM. 11/25/17   [name], [name], MD    Family History Family History  Problem Relation Age of Onset  . Cervical cancer Mother   . Hypertension Brother     Social History Social History   Tobacco Use  . Smoking status: Never Smoker  . Smokeless tobacco: Never Used  Substance Use Topics  . Alcohol use: Never    Frequency: Never  . Drug use: Never     Allergies   Chlorthalidone; Lisinopril; and Persantine [dipyridamole]   Review of Systems Review of Systems  Constitutional: Negative for diaphoresis and fever.  Respiratory: Positive for cough. Negative for shortness of breath.   Cardiovascular: Positive for chest pain. Negative for syncope.  Gastrointestinal: Negative for abdominal pain and vomiting.  All other systems reviewed and are negative.    Physical Exam Updated Vital Signs BP 130/83   Pulse 91   Resp (!) 21   Ht 1.549 m (5\' 1" )   Wt 60.8 kg   SpO2 97%   BMI 25.32 kg/m   Physical Exam CONSTITUTIONAL: Elderly, no acute distress HEAD: Normocephalic/atraumatic EYES: EOMI/PERRL ENMT: Mucous membranes moist NECK: supple no meningeal signs SPINE/BACK: Kyphotic spine CV: S1/S2 noted, irregular LUNGS: Lungs are clear to auscultation bilaterally, no apparent distress ABDOMEN: soft, nontender, no rebound or guarding, bowel sounds noted throughout abdomen GU:no cva tenderness NEURO: Pt is awake/alert/appropriate, moves all extremitiesx4.  No facial droop.   EXTREMITIES: pulses normal/equal, full ROM SKIN: warm, color normal PSYCH: no abnormalities of mood noted, alert and oriented to situation  ED Treatments / Results  Labs (all labs ordered are listed, but only abnormal results are displayed) Labs Reviewed  BASIC METABOLIC PANEL - Abnormal; Notable for the following components:      Result Value   Glucose, Bld 129 (*)    Creatinine, Ser 1.06 (*)    GFR calc non Af Amer 47 (*)    GFR  calc Af Amer 55 (*)    All other components within normal limits  CBC - Abnormal; Notable for the following components:   RBC 3.62 (*)    Hemoglobin 11.4 (*)    HCT 35.2 (*)    All other components within normal limits  I-STAT TROPONIN, ED    EKG EKG Interpretation  Date/Time:  Wednesday March 11 2018 02:03:13 EST Ventricular Rate:  89 PR Interval:    QRS Duration: 126 QT Interval:  389 QTC Calculation: 474 R Axis:   -51 Text Interpretation:  Atrial fibrillation Left bundle branch block Abnormal ekg Interpretation limited secondary to artifact needs repeat ekg Confirmed by [name] [phone]) on 03/11/2018 2:19:58 AM   Radiology Dg Chest 2 View  Result Date: 03/11/2018 CLINICAL DATA:  Acute onset of generalized chest pain and pressure. Patient on Eliquis. EXAM: CHEST - 2 VIEW COMPARISON:  Chest  radiograph performed 03/02/2018 FINDINGS: Small bilateral pleural effusions are noted. Mild bibasilar airspace opacities, new from the prior study, may reflect atelectasis or possibly mild infection. No pneumothorax is seen. The cardiomediastinal silhouette is mildly enlarged. No acute osseous abnormalities are identified. Postoperative change is noted at the left axilla. Changes of vertebroplasty are noted at the upper lumbar spine. IMPRESSION: 1. Small bilateral pleural effusions. Mild bibasilar airspace opacities, new from the prior study, may reflect atelectasis or possibly mild infection. 2. Mild cardiomegaly. Electronically Signed   By: Garald Balding M.D.   On: 03/11/2018 02:18       This patients CHA2DS2-VASc Score and unadjusted Ischemic Stroke Rate (% per year) is equal to 11.2 % stroke rate/year from a score of 7  Above score calculated as 1 point each if present [CHF, HTN, DM, Vascular=MI/PAD/Aortic Plaque, Age if 65-74, or Female] Above score calculated as 2 points each if present [Age > 75, or Stroke/TIA/TE]    Procedures Procedures    Medications Ordered in  ED Medications - No data to display   Initial Impression / Assessment and Plan / ED Course  I have reviewed the triage vital signs and the nursing notes.  Pertinent labs & imaging results that were available during my care of the patient were reviewed by me and considered in my medical decision making (see chart for details).     3:18 AM Patient had a stroke earlier this month and responded to mechanical thrombectomy at that time.  Tonight she presents with chest tightness that has since resolved.  She has multiple comorbidities which would raise her level of risk for ACS.  No signs or symptoms of PE or dissection at this time.  Work-up is pending at this time   Patient had no recurrent chest pain.  She feels at baseline. Advised that I would be unable to rule out ACS with one EKG and troponin as she is very high risk for MI.  Admission is warranted.  Patient declines as she just got out of the hospital, and it is Christmas Day. After further discussion with patient and family, they elect to go home. I will prescribe a course of nitroglycerin if any pain recurs, but I advised that if pain returns and does not improve with nitroglycerin or accelerates in pain, she must come back to the ER.  She already has cardiology and PCP follow-up plan Final Clinical Impressions(s) / ED Diagnoses   Final diagnoses:  Precordial pain    ED Discharge Orders         Ordered    nitroGLYCERIN (NITROSTAT) 0.4 MG SL tablet  Every 5 min PRN     03/11/18 0515           [name], MD 03/11/18 [phone]

## 2018-03-11 NOTE — ED Triage Notes (Signed)
Pt presents to ED via EMS from home with L sided chest pressure onset 2100 last night. Hx recent CVA and started on Eliquis. 324 ASA and 3 SLN given PTA which resolved the pain. Denies shob.

## 2018-03-11 NOTE — ED Notes (Signed)
Pt discharged from ED; instructions provided and scripts given; Pt encouraged to return to ED if symptoms worsen and to f/u with PCP; Pt verbalized understanding of all instructions 

## 2018-03-11 NOTE — ED Notes (Signed)
Nurse drawing labs. 

## 2018-03-11 NOTE — Discharge Instructions (Addendum)

## 2018-03-15 DIAGNOSIS — I69354 Hemiplegia and hemiparesis following cerebral infarction affecting left non-dominant side: Secondary | ICD-10-CM | POA: Diagnosis not present

## 2018-03-15 DIAGNOSIS — I110 Hypertensive heart disease with heart failure: Secondary | ICD-10-CM | POA: Diagnosis not present

## 2018-03-15 DIAGNOSIS — E785 Hyperlipidemia, unspecified: Secondary | ICD-10-CM | POA: Diagnosis not present

## 2018-03-15 DIAGNOSIS — I509 Heart failure, unspecified: Secondary | ICD-10-CM | POA: Diagnosis not present

## 2018-03-15 DIAGNOSIS — Z7901 Long term (current) use of anticoagulants: Secondary | ICD-10-CM | POA: Diagnosis not present

## 2018-03-15 DIAGNOSIS — I4891 Unspecified atrial fibrillation: Secondary | ICD-10-CM | POA: Diagnosis not present

## 2018-03-16 ENCOUNTER — Other Ambulatory Visit (HOSPITAL_COMMUNITY): Payer: Self-pay | Admitting: Interventional Radiology

## 2018-03-16 DIAGNOSIS — I639 Cerebral infarction, unspecified: Secondary | ICD-10-CM

## 2018-03-17 DIAGNOSIS — Z7901 Long term (current) use of anticoagulants: Secondary | ICD-10-CM | POA: Diagnosis not present

## 2018-03-17 DIAGNOSIS — I69354 Hemiplegia and hemiparesis following cerebral infarction affecting left non-dominant side: Secondary | ICD-10-CM | POA: Diagnosis not present

## 2018-03-17 DIAGNOSIS — I509 Heart failure, unspecified: Secondary | ICD-10-CM | POA: Diagnosis not present

## 2018-03-17 DIAGNOSIS — I110 Hypertensive heart disease with heart failure: Secondary | ICD-10-CM | POA: Diagnosis not present

## 2018-03-17 DIAGNOSIS — E785 Hyperlipidemia, unspecified: Secondary | ICD-10-CM | POA: Diagnosis not present

## 2018-03-17 DIAGNOSIS — I4891 Unspecified atrial fibrillation: Secondary | ICD-10-CM | POA: Diagnosis not present

## 2018-03-19 DIAGNOSIS — Z7901 Long term (current) use of anticoagulants: Secondary | ICD-10-CM | POA: Diagnosis not present

## 2018-03-19 DIAGNOSIS — I509 Heart failure, unspecified: Secondary | ICD-10-CM | POA: Diagnosis not present

## 2018-03-19 DIAGNOSIS — I69354 Hemiplegia and hemiparesis following cerebral infarction affecting left non-dominant side: Secondary | ICD-10-CM | POA: Diagnosis not present

## 2018-03-19 DIAGNOSIS — I4891 Unspecified atrial fibrillation: Secondary | ICD-10-CM | POA: Diagnosis not present

## 2018-03-19 DIAGNOSIS — E785 Hyperlipidemia, unspecified: Secondary | ICD-10-CM | POA: Diagnosis not present

## 2018-03-19 DIAGNOSIS — I110 Hypertensive heart disease with heart failure: Secondary | ICD-10-CM | POA: Diagnosis not present

## 2018-03-20 DIAGNOSIS — I509 Heart failure, unspecified: Secondary | ICD-10-CM | POA: Diagnosis not present

## 2018-03-20 DIAGNOSIS — E785 Hyperlipidemia, unspecified: Secondary | ICD-10-CM | POA: Diagnosis not present

## 2018-03-20 DIAGNOSIS — I4891 Unspecified atrial fibrillation: Secondary | ICD-10-CM | POA: Diagnosis not present

## 2018-03-20 DIAGNOSIS — Z7901 Long term (current) use of anticoagulants: Secondary | ICD-10-CM | POA: Diagnosis not present

## 2018-03-20 DIAGNOSIS — I110 Hypertensive heart disease with heart failure: Secondary | ICD-10-CM | POA: Diagnosis not present

## 2018-03-20 DIAGNOSIS — I69354 Hemiplegia and hemiparesis following cerebral infarction affecting left non-dominant side: Secondary | ICD-10-CM | POA: Diagnosis not present

## 2018-03-21 ENCOUNTER — Other Ambulatory Visit: Payer: Self-pay

## 2018-03-21 ENCOUNTER — Encounter (HOSPITAL_COMMUNITY): Payer: Self-pay | Admitting: Emergency Medicine

## 2018-03-21 ENCOUNTER — Emergency Department (HOSPITAL_COMMUNITY)
Admission: EM | Admit: 2018-03-21 | Discharge: 2018-03-21 | Disposition: A | Discharge status: Home / Self Care | Payer: Medicare Other | Attending: Emergency Medicine | Admitting: Emergency Medicine

## 2018-03-21 ENCOUNTER — Emergency Department (HOSPITAL_COMMUNITY): Payer: Medicare Other

## 2018-03-21 DIAGNOSIS — I447 Left bundle-branch block, unspecified: Secondary | ICD-10-CM | POA: Diagnosis not present

## 2018-03-21 DIAGNOSIS — Z79899 Other long term (current) drug therapy: Secondary | ICD-10-CM | POA: Insufficient documentation

## 2018-03-21 DIAGNOSIS — R630 Anorexia: Secondary | ICD-10-CM | POA: Diagnosis not present

## 2018-03-21 DIAGNOSIS — R600 Localized edema: Secondary | ICD-10-CM | POA: Diagnosis not present

## 2018-03-21 DIAGNOSIS — R0602 Shortness of breath: Secondary | ICD-10-CM | POA: Diagnosis present

## 2018-03-21 DIAGNOSIS — I509 Heart failure, unspecified: Secondary | ICD-10-CM | POA: Diagnosis not present

## 2018-03-21 DIAGNOSIS — R079 Chest pain, unspecified: Secondary | ICD-10-CM | POA: Diagnosis not present

## 2018-03-21 DIAGNOSIS — I110 Hypertensive heart disease with heart failure: Secondary | ICD-10-CM | POA: Insufficient documentation

## 2018-03-21 DIAGNOSIS — I959 Hypotension, unspecified: Secondary | ICD-10-CM | POA: Diagnosis not present

## 2018-03-21 DIAGNOSIS — R000 Tachycardia, unspecified: Secondary | ICD-10-CM | POA: Diagnosis not present

## 2018-03-21 DIAGNOSIS — Z7901 Long term (current) use of anticoagulants: Secondary | ICD-10-CM | POA: Insufficient documentation

## 2018-03-21 DIAGNOSIS — I4891 Unspecified atrial fibrillation: Secondary | ICD-10-CM | POA: Diagnosis not present

## 2018-03-21 DIAGNOSIS — R918 Other nonspecific abnormal finding of lung field: Secondary | ICD-10-CM | POA: Diagnosis not present

## 2018-03-21 DIAGNOSIS — Z853 Personal history of malignant neoplasm of breast: Secondary | ICD-10-CM | POA: Diagnosis not present

## 2018-03-21 DIAGNOSIS — R52 Pain, unspecified: Secondary | ICD-10-CM | POA: Diagnosis not present

## 2018-03-21 LAB — CBC WITH DIFFERENTIAL/PLATELET
Abs Immature Granulocytes: 0.01 10*3/uL (ref 0.00–0.07)
BASOS ABS: 0 10*3/uL (ref 0.0–0.1)
Basophils Relative: 1 %
Eosinophils Absolute: 0.1 10*3/uL (ref 0.0–0.5)
Eosinophils Relative: 2 %
HCT: 37.7 % (ref 36.0–46.0)
Hemoglobin: 12.2 g/dL (ref 12.0–15.0)
Immature Granulocytes: 0 %
Lymphocytes Relative: 21 %
Lymphs Abs: 0.9 10*3/uL (ref 0.7–4.0)
MCH: 31.6 pg (ref 26.0–34.0)
MCHC: 32.4 g/dL (ref 30.0–36.0)
MCV: 97.7 fL (ref 80.0–100.0)
Monocytes Absolute: 0.4 10*3/uL (ref 0.1–1.0)
Monocytes Relative: 11 %
Neutro Abs: 2.8 10*3/uL (ref 1.7–7.7)
Neutrophils Relative %: 65 %
Platelets: 222 10*3/uL (ref 150–400)
RBC: 3.86 MIL/uL — ABNORMAL LOW (ref 3.87–5.11)
RDW: 14.6 % (ref 11.5–15.5)
WBC: 4.2 10*3/uL (ref 4.0–10.5)
nRBC: 0 % (ref 0.0–0.2)

## 2018-03-21 LAB — COMPREHENSIVE METABOLIC PANEL
ALT: 23 U/L (ref 0–44)
AST: 25 U/L (ref 15–41)
Albumin: 3.4 g/dL — ABNORMAL LOW (ref 3.5–5.0)
Alkaline Phosphatase: 50 U/L (ref 38–126)
Anion gap: 10 (ref 5–15)
BUN: 14 mg/dL (ref 8–23)
CALCIUM: 9.1 mg/dL (ref 8.9–10.3)
CO2: 24 mmol/L (ref 22–32)
Chloride: 103 mmol/L (ref 98–111)
Creatinine, Ser: 1.02 mg/dL — ABNORMAL HIGH (ref 0.44–1.00)
GFR calc Af Amer: 57 mL/min — ABNORMAL LOW (ref >60)
GFR calc non Af Amer: 49 mL/min — ABNORMAL LOW (ref >60)
GLUCOSE: 117 mg/dL — AB (ref 70–99)
Potassium: 3.9 mmol/L (ref 3.5–5.1)
Sodium: 137 mmol/L (ref 135–145)
Total Bilirubin: 1.3 mg/dL — ABNORMAL HIGH (ref 0.3–1.2)
Total Protein: 5.9 g/dL — ABNORMAL LOW (ref 6.5–8.1)

## 2018-03-21 LAB — URINALYSIS, ROUTINE W REFLEX MICROSCOPIC
Bilirubin Urine: NEGATIVE
Glucose, UA: NEGATIVE mg/dL
Hgb urine dipstick: NEGATIVE
KETONES UR: NEGATIVE mg/dL
Nitrite: NEGATIVE
Protein, ur: NEGATIVE mg/dL
Specific Gravity, Urine: 1.018 (ref 1.005–1.030)
pH: 5 (ref 5.0–8.0)

## 2018-03-21 LAB — TROPONIN I: Troponin I: <0.03 ng/mL (ref <0.03)

## 2018-03-21 LAB — BRAIN NATRIURETIC PEPTIDE: B Natriuretic Peptide: 1575.2 pg/mL — ABNORMAL HIGH (ref 0.0–100.0)

## 2018-03-21 MED ORDER — FUROSEMIDE 10 MG/ML IJ SOLN
40.0000 mg | Freq: Once | INTRAMUSCULAR | Status: AC
  Administered 2018-03-21: 40 mg via INTRAVENOUS
  Filled 2018-03-21: qty 4

## 2018-03-21 NOTE — ED Provider Notes (Signed)
[facility] EMERGENCY DEPARTMENT Provider Note   CSN: [id number] Arrival date & time: 03/21/18  4696     History   Chief Complaint Chief Complaint  Patient presents with  . Flank Pain  . Abdominal Pain    HPI Haley Costa is a 85 y.o. female.   Flank Pain  Associated symptoms include chest pain, abdominal pain and shortness of breath.  Abdominal Pain   Pertinent negatives include fever.  Patient presents with chest pain.  It is in the epigastric area and between her sternum per the patient.  States she is had this before and was told it may be her heart failure.  It is been more severe recently.  Took some nitroglycerin at home and states it helped.  Has had some shortness of breath and increased swelling in her legs.  States her weight has been doing well.  No nausea vomiting.  No cough.  History of congestive heart failure.  Reportedly has had decreased appetite and decreased oral intake.  Past Medical History:  Diagnosis Date  . Breast cancer, left breast ([place]) 1992   S/P mastectomy & chemo  . CHF (congestive heart failure) ([place])   . Chronic back pain    "lower and middle back; I've had several broken vertebrae" (12/09/2017)  . Heart murmur   . High cholesterol   . History of kidney stones   . Hypertension   . LBBB (left bundle branch block)    Patient states that she was told by her PCP that she has left bundle blockage  . Osteoarthritis    "some of the joints" (12/09/2017)  . Pneumonia    "when I was a child" (12/09/2017)  . PONV (postoperative nausea and vomiting)     Patient Active Problem List   Diagnosis Date Noted  . Hyperlipidemia LDL goal <70 03/03/2018  . 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 Stroke ([place]) 02/28/2018  . Acute ischemic stroke [facility]), s/p mechanical throbmectomy 02/28/2018  . 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 AF (paroxysmal atrial fibrillation) ([place])   . Acute on chronic respiratory  failure ([place])   . Hyponatremia 12/09/2017  . Acute exacerbation of CHF (congestive heart failure) ([place]) 11/22/2017  . Chest pain 11/21/2017  . Acute CHF (congestive heart failure) ([place]) 11/21/2017  . HTN (hypertension) 11/21/2017    Past Surgical History:  Procedure Laterality Date  . APPENDECTOMY    . BACK SURGERY    . CATARACT EXTRACTION W/ INTRAOCULAR LENS IMPLANT Right   . FIXATION KYPHOPLASTY THORACIC SPINE  2014  . IR CT HEAD LTD  02/28/2018  . IR PERCUTANEOUS ART THROMBECTOMY/INFUSION INTRACRANIAL INC DIAG ANGIO  02/28/2018  . MASTECTOMY Left 1992  . RADIOLOGY WITH ANESTHESIA N/A 02/28/2018   Procedure: RADIOLOGY WITH ANESTHESIA;  Surgeon: [name], MD;  Location: [facility];  Service: Radiology;  Laterality: N/A;     OB History   No obstetric history on file.      Home Medications    Prior to Admission medications   Medication Sig Start Date End Date Taking? Authorizing Provider  apixaban (ELIQUIS) 5 MG TABS tablet Take 1 tablet (5 mg total) by mouth 2 (two) times daily. 03/03/18  Yes Donzetta Starch, NP  Artificial Tear Ointment (DRY EYES OP) Apply 1 drop to eye daily as needed (for dry eyes).   Yes [provider]  carvedilol (COREG) 6.25 MG tablet Take 1 tablet (6.25 mg total) by mouth 2 (two) times daily  with a meal. 02/04/18  Yes [name], [name], MD  furosemide (LASIX) 40 MG tablet Take 0.5 tablets (20 mg total) by mouth daily. Patient taking differently: Take 40 mg by mouth daily.  12/10/17  Yes [name], [name], MD  losartan (COZAAR) 25 MG tablet Take 1 tablet (25 mg total) by mouth daily. 11/26/17  Yes [name], [name], MD  nitroGLYCERIN (NITROSTAT) 0.4 MG SL tablet Place 1 tablet (0.4 mg total) under the tongue every 5 (five) minutes as needed for chest pain. 03/11/18  Yes [name], MD  potassium chloride 20 MEQ TBCR Take 20 mEq by mouth daily. 12/10/17  Yes [name], [name], MD  rosuvastatin (CRESTOR) 5 MG tablet Take 1 tablet (5 mg  total) by mouth daily at 6 PM. Patient not taking: Reported on 03/11/2018 11/25/17   Modena Jansky, MD    Family History Family History  Problem Relation Age of Onset  . Cervical cancer Mother   . Hypertension Brother     Social History Social History   Tobacco Use  . Smoking status: Never Smoker  . Smokeless tobacco: Never Used  Substance Use Topics  . Alcohol use: Never    Frequency: Never  . Drug use: Never     Allergies   Chlorthalidone; Lisinopril; and Persantine [dipyridamole]   Review of Systems Review of Systems  Constitutional: Negative for chills and fever.  HENT: Negative for congestion.   Eyes: Negative for visual disturbance.  Respiratory: Positive for shortness of breath.   Cardiovascular: Positive for chest pain.  Gastrointestinal: Positive for abdominal pain.  Genitourinary: Negative for flank pain.  Musculoskeletal: Negative for back pain.  Skin: Negative for rash.  Neurological: Negative for weakness.     Physical Exam Updated Vital Signs BP 116/68   Pulse 88   Temp 97.7 F (36.5 C) (Oral)   Resp 14   Ht 5\' 1"  (1.549 m)   Wt 59 kg   SpO2 96%   BMI 24.56 kg/m   Physical Exam Cardiovascular:     Comments: Irregular rhythm. Pulmonary:     Comments: Harsh breath sounds particularly at right base. Abdominal:     General: Abdomen is flat.     Tenderness: There is no abdominal tenderness.  Skin:    General: Skin is warm.  Neurological:     Mental Status: She is alert and oriented to person, place, and time.  Psychiatric:        Mood and Affect: Mood normal.   Pitting edema bilateral lower extremities.   ED Treatments / Results  Labs (all labs ordered are listed, but only abnormal results are displayed) Labs Reviewed  COMPREHENSIVE METABOLIC PANEL - Abnormal; Notable for the following components:      Result Value   Glucose, Bld 117 (*)    Creatinine, Ser 1.02 (*)    Total Protein 5.9 (*)    Albumin 3.4 (*)    Total  Bilirubin 1.3 (*)    GFR calc non Af Amer 49 (*)    GFR calc Af Amer 57 (*)    All other components within normal limits  CBC WITH DIFFERENTIAL/PLATELET - Abnormal; Notable for the following components:   RBC 3.86 (*)    All other components within normal limits  BRAIN NATRIURETIC PEPTIDE - Abnormal; Notable for the following components:   B Natriuretic Peptide 1,575.2 (*)    All other components within normal limits  URINALYSIS, ROUTINE W REFLEX MICROSCOPIC - Abnormal; Notable for the following components:  Color, Urine AMBER (*)    APPearance HAZY (*)    Leukocytes, UA TRACE (*)    Bacteria, UA RARE (*)    All other components within normal limits  TROPONIN I    EKG EKG Interpretation  Date/Time:  Saturday March 21 2018 07:53:41 EST Ventricular Rate:  83 PR Interval:    QRS Duration: 127 QT Interval:  400 QTC Calculation: 470 R Axis:   -68 Text Interpretation:  Atrial fibrillation Nonspecific IVCD with LAD LVH with secondary repolarization abnormality Anterior Q waves, possibly due to LVH No significant change since last tracing Confirmed by Davonna Belling [phone]) on 03/21/2018 8:51:34 AM   Radiology Dg Chest 2 View  Result Date: 03/21/2018 CLINICAL DATA:  Left flank/abdominal pain since last night. EXAM: CHEST - 2 VIEW COMPARISON:  03/11/2018 FINDINGS: Lungs are adequately inflated demonstrate bibasilar opacification with mild interval improvement likely small effusions with bibasilar atelectasis. Mild stable cardiomegaly. Remainder of the exam is unchanged. IMPRESSION: Improving bibasilar opacification likely improved effusions/atelectasis. Mild stable cardiomegaly. Electronically Signed   By: Marin Olp M.D.   On: 03/21/2018 08:48    Procedures Procedures (including critical care time)  Medications Ordered in ED Medications  furosemide (LASIX) injection 40 mg (40 mg Intravenous Given 03/21/18 1127)     Initial Impression / Assessment and Plan / ED Course  I  have reviewed the triage vital signs and the nursing notes.  Pertinent labs & imaging results that were available during my care of the patient were reviewed by me and considered in my medical decision making (see chart for details).     Patient with mid chest pain.  Appears to be somewhat volume overloaded.  Edema on legs.  BNP elevated.  Give dose of Lasix.  EKG stable.  Troponin negative.  Patient has a history of CHF and cardiac problems with an EF of around 25%, however patient does not want further stress test or cardiac cath.  With this in mind I think it is reasonable for her to be discharged home.  IV Lasix given here and has follow-up tomorrow with PCP.  Discharge home.  Final Clinical Impressions(s) / ED Diagnoses   Final diagnoses:  Acute on chronic congestive heart failure, unspecified heart failure type [facility])    ED Discharge Orders    None       Davonna Belling, MD 03/21/18 1203

## 2018-03-21 NOTE — Discharge Instructions (Addendum)
Follow-up with your doctor on Monday as planned.  Follow-up with cardiology.  You will likely need to take extra Lasix also.

## 2018-03-21 NOTE — ED Triage Notes (Signed)
Per [facility]- pt picked up from home due to l flank and upper abd pain since 10pm last night. Pt reports hx of CHF and possible fluid build up that  Is causing issue.  Reports nausea, denies vomiting.  Hx of stroke with l side deficit.

## 2018-03-21 NOTE — ED Notes (Signed)
Pt transported to xray 

## 2018-03-23 DIAGNOSIS — I509 Heart failure, unspecified: Secondary | ICD-10-CM | POA: Diagnosis not present

## 2018-03-23 DIAGNOSIS — I6930 Unspecified sequelae of cerebral infarction: Secondary | ICD-10-CM | POA: Diagnosis not present

## 2018-03-23 DIAGNOSIS — Z9289 Personal history of other medical treatment: Secondary | ICD-10-CM | POA: Diagnosis not present

## 2018-03-25 DIAGNOSIS — E785 Hyperlipidemia, unspecified: Secondary | ICD-10-CM | POA: Diagnosis not present

## 2018-03-25 DIAGNOSIS — I110 Hypertensive heart disease with heart failure: Secondary | ICD-10-CM | POA: Diagnosis not present

## 2018-03-25 DIAGNOSIS — I509 Heart failure, unspecified: Secondary | ICD-10-CM | POA: Diagnosis not present

## 2018-03-25 DIAGNOSIS — I4891 Unspecified atrial fibrillation: Secondary | ICD-10-CM | POA: Diagnosis not present

## 2018-03-25 DIAGNOSIS — Z7901 Long term (current) use of anticoagulants: Secondary | ICD-10-CM | POA: Diagnosis not present

## 2018-03-25 DIAGNOSIS — I69354 Hemiplegia and hemiparesis following cerebral infarction affecting left non-dominant side: Secondary | ICD-10-CM | POA: Diagnosis not present

## 2018-03-26 DIAGNOSIS — E785 Hyperlipidemia, unspecified: Secondary | ICD-10-CM | POA: Diagnosis not present

## 2018-03-26 DIAGNOSIS — Z7901 Long term (current) use of anticoagulants: Secondary | ICD-10-CM | POA: Diagnosis not present

## 2018-03-26 DIAGNOSIS — I69354 Hemiplegia and hemiparesis following cerebral infarction affecting left non-dominant side: Secondary | ICD-10-CM | POA: Diagnosis not present

## 2018-03-26 DIAGNOSIS — I509 Heart failure, unspecified: Secondary | ICD-10-CM | POA: Diagnosis not present

## 2018-03-26 DIAGNOSIS — I110 Hypertensive heart disease with heart failure: Secondary | ICD-10-CM | POA: Diagnosis not present

## 2018-03-26 DIAGNOSIS — I4891 Unspecified atrial fibrillation: Secondary | ICD-10-CM | POA: Diagnosis not present

## 2018-03-27 DIAGNOSIS — I110 Hypertensive heart disease with heart failure: Secondary | ICD-10-CM | POA: Diagnosis not present

## 2018-03-27 DIAGNOSIS — I509 Heart failure, unspecified: Secondary | ICD-10-CM | POA: Diagnosis not present

## 2018-03-27 DIAGNOSIS — I69354 Hemiplegia and hemiparesis following cerebral infarction affecting left non-dominant side: Secondary | ICD-10-CM | POA: Diagnosis not present

## 2018-03-27 DIAGNOSIS — E785 Hyperlipidemia, unspecified: Secondary | ICD-10-CM | POA: Diagnosis not present

## 2018-03-27 DIAGNOSIS — Z7901 Long term (current) use of anticoagulants: Secondary | ICD-10-CM | POA: Diagnosis not present

## 2018-03-27 DIAGNOSIS — I4891 Unspecified atrial fibrillation: Secondary | ICD-10-CM | POA: Diagnosis not present

## 2018-03-30 DIAGNOSIS — I4891 Unspecified atrial fibrillation: Secondary | ICD-10-CM | POA: Diagnosis not present

## 2018-03-30 DIAGNOSIS — I110 Hypertensive heart disease with heart failure: Secondary | ICD-10-CM | POA: Diagnosis not present

## 2018-03-30 DIAGNOSIS — E785 Hyperlipidemia, unspecified: Secondary | ICD-10-CM | POA: Diagnosis not present

## 2018-03-30 DIAGNOSIS — I509 Heart failure, unspecified: Secondary | ICD-10-CM | POA: Diagnosis not present

## 2018-03-30 DIAGNOSIS — Z7901 Long term (current) use of anticoagulants: Secondary | ICD-10-CM | POA: Diagnosis not present

## 2018-03-30 DIAGNOSIS — I69354 Hemiplegia and hemiparesis following cerebral infarction affecting left non-dominant side: Secondary | ICD-10-CM | POA: Diagnosis not present

## 2018-03-31 NOTE — Progress Notes (Signed)
HPI: Follow-up congestive heart failure.  Patient admitted with chest pain and CHF in September 2019.  Echocardiogram showed ejection fraction 25%.  Cardiac catheterization was discussed with patient but she declined and preferred medical therapy.  She was placed on Entresto and spironolactone but developed hypotension resulting in syncope.  Had CVA December 2019.  Echocardiogram repeated December 2019 and showed ejection fraction 25 to 30%, severe mitral regurgitation, biatrial enlargement, moderate RV dysfunction, moderate tricuspid regurgitation and moderate to severe pulmonary hypertension.  Patient had thrombectomy of right M1 occlusion.  She was found to have atrial fibrillation on telemetry and Eliquis was initiated.  Seen in the emergency room December 25 with chest pain and enzymes negative.  Also seen with chest pain and abdominal pain March 21, 2018.  Troponin negative.  Since last seen she has some fatigue.  Occasional dyspnea on exertion but no orthopnea or PND.  Minimal pedal edema.  She has taken nitroglycerin occasionally but denies chest pain.  No syncope.  Current Outpatient Medications  Medication Sig Dispense Refill  . apixaban (ELIQUIS) 5 MG TABS tablet Take 1 tablet (5 mg total) by mouth 2 (two) times daily. 60 tablet 2  . carvedilol (COREG) 6.25 MG tablet Take 1 tablet (6.25 mg total) by mouth 2 (two) times daily with a meal. 180 tablet 3  . furosemide (LASIX) 40 MG tablet Take 0.5 tablets (20 mg total) by mouth daily. (Patient taking differently: Take 40 mg by mouth daily. ) 30 tablet 0  . losartan (COZAAR) 25 MG tablet Take 1 tablet (25 mg total) by mouth daily. 30 tablet 0  . nitroGLYCERIN (NITROSTAT) 0.4 MG SL tablet Place 1 tablet (0.4 mg total) under the tongue every 5 (five) minutes as needed for chest pain. 30 tablet 0  . potassium chloride 20 MEQ TBCR Take 20 mEq by mouth daily. 30 tablet 0   No current facility-administered medications for this visit.       Past Medical History:  Diagnosis Date  . Breast cancer, left breast ([place]) 1992   S/P mastectomy & chemo  . CHF (congestive heart failure) ([place])   . Chronic back pain    "lower and middle back; I've had several broken vertebrae" (12/09/2017)  . Heart murmur   . High cholesterol   . History of kidney stones   . Hypertension   . LBBB (left bundle branch block)    Patient states that she was told by her PCP that she has left bundle blockage  . Osteoarthritis    "some of the joints" (12/09/2017)  . Pneumonia    "when I was a child" (12/09/2017)  . PONV (postoperative nausea and vomiting)     Past Surgical History:  Procedure Laterality Date  . APPENDECTOMY    . BACK SURGERY    . CATARACT EXTRACTION W/ INTRAOCULAR LENS IMPLANT Right   . FIXATION KYPHOPLASTY THORACIC SPINE  2014  . IR CT HEAD LTD  02/28/2018  . IR PERCUTANEOUS ART THROMBECTOMY/INFUSION INTRACRANIAL INC DIAG ANGIO  02/28/2018  . MASTECTOMY Left 1992  . RADIOLOGY WITH ANESTHESIA N/A 02/28/2018   Procedure: RADIOLOGY WITH ANESTHESIA;  Surgeon: [name], MD;  Location: [place];  Service: Radiology;  Laterality: N/A;    Social History   Socioeconomic History  . Marital status: Widowed    Spouse name: Not on file  . Number of children: Not on file  . Years of education: Not on file  . Highest education level: Not on file  Occupational History  . Not on file  Social Needs  . Financial resource strain: Not on file  . Food insecurity:    Worry: Not on file    Inability: Not on file  . Transportation needs:    Medical: Not on file    Non-medical: Not on file  Tobacco Use  . Smoking status: Never Smoker  . Smokeless tobacco: Never Used  Substance and Sexual Activity  . Alcohol use: Never    Frequency: Never  . Drug use: Never  . Sexual activity: Not on file  Lifestyle  . Physical activity:    Days per week: Not on file    Minutes per session: Not on file  . Stress: Not on file  Relationships   . Social connections:    Talks on phone: Not on file    Gets together: Not on file    Attends religious service: Not on file    Active member of club or organization: Not on file    Attends meetings of clubs or organizations: Not on file    Relationship status: Not on file  . Intimate partner violence:    Fear of current or ex partner: Not on file    Emotionally abused: Not on file    Physically abused: Not on file    Forced sexual activity: Not on file  Other Topics Concern  . Not on file  Social History Narrative  . Not on file    Family History  Problem Relation Age of Onset  . Cervical cancer Mother   . Hypertension Brother     ROS: no fevers or chills, productive cough, hemoptysis, dysphasia, odynophagia, melena, hematochezia, dysuria, hematuria, rash, seizure activity, orthopnea, PND, pedal edema, claudication. Remaining systems are negative.  Physical Exam: Well-developed well-nourished in no acute distress.  Skin is warm and dry.  HEENT is normal.  Neck is supple.  Chest is clear to auscultation with normal expansion.  Cardiovascular exam is irregular Abdominal exam nontender or distended. No masses palpated. Extremities show no edema. neuro grossly intact  A/P  1 chronic systolic congestive heart failure-patient appears to be euvolemic on examination today.  We will continue with present dose of diuretic.  Check potassium and renal function.  Continue fluid restriction and low-sodium diet.  I have asked her to weigh daily and she will take an additional 20 mg of Lasix for weight gain of 2 to 3 pounds.  2 cardiomyopathy-previous ejection fraction 25 to 30%.  Patient declined cardiac catheterization previously.  We will continue medical therapy with ARB and beta-blocker.  We cannot advance medications as blood pressure is borderline.  3 hypertension-patient's blood pressure is controlled.  Continue present medications and follow.  4 paroxysmal atrial  fibrillation- Continue apixaban at but decrease to 2.5 mg twice daily.  She will need lifelong anticoagulation given CVA.  Continue carvedilol.  5 hyperlipidemia-per IM  6 severe MR-patient wants only conservative measures.  Kirk Ruths, MD

## 2018-04-01 ENCOUNTER — Other Ambulatory Visit (HOSPITAL_COMMUNITY): Payer: Medicare Other

## 2018-04-01 DIAGNOSIS — I69354 Hemiplegia and hemiparesis following cerebral infarction affecting left non-dominant side: Secondary | ICD-10-CM | POA: Diagnosis not present

## 2018-04-01 DIAGNOSIS — I4891 Unspecified atrial fibrillation: Secondary | ICD-10-CM | POA: Diagnosis not present

## 2018-04-01 DIAGNOSIS — Z7901 Long term (current) use of anticoagulants: Secondary | ICD-10-CM | POA: Diagnosis not present

## 2018-04-01 DIAGNOSIS — E785 Hyperlipidemia, unspecified: Secondary | ICD-10-CM | POA: Diagnosis not present

## 2018-04-01 DIAGNOSIS — I509 Heart failure, unspecified: Secondary | ICD-10-CM | POA: Diagnosis not present

## 2018-04-01 DIAGNOSIS — I110 Hypertensive heart disease with heart failure: Secondary | ICD-10-CM | POA: Diagnosis not present

## 2018-04-02 ENCOUNTER — Ambulatory Visit (INDEPENDENT_AMBULATORY_CARE_PROVIDER_SITE_OTHER): Payer: Medicare Other | Admitting: Cardiology

## 2018-04-02 ENCOUNTER — Encounter: Payer: Self-pay | Admitting: Cardiology

## 2018-04-02 VITALS — BP 98/48 | HR 69 | Ht 61.0 in | Wt 129.8 lb

## 2018-04-02 DIAGNOSIS — I420 Dilated cardiomyopathy: Secondary | ICD-10-CM

## 2018-04-02 DIAGNOSIS — I5022 Chronic systolic (congestive) heart failure: Secondary | ICD-10-CM

## 2018-04-02 DIAGNOSIS — I10 Essential (primary) hypertension: Secondary | ICD-10-CM

## 2018-04-02 LAB — BASIC METABOLIC PANEL
BUN/Creatinine Ratio: 23 (ref 12–28)
BUN: 24 mg/dL (ref 8–27)
CO2: 24 mmol/L (ref 20–29)
CREATININE: 1.06 mg/dL — AB (ref 0.57–1.00)
Calcium: 9.5 mg/dL (ref 8.7–10.3)
Chloride: 100 mmol/L (ref 96–106)
GFR calc Af Amer: 55 mL/min/{1.73_m2} — ABNORMAL LOW (ref >59)
GFR calc non Af Amer: 47 mL/min/{1.73_m2} — ABNORMAL LOW (ref >59)
Glucose: 96 mg/dL (ref 65–99)
Potassium: 4.4 mmol/L (ref 3.5–5.2)
Sodium: 141 mmol/L (ref 134–144)

## 2018-04-02 MED ORDER — APIXABAN 2.5 MG PO TABS: 2.5000 mg | ORAL_TABLET | Freq: Two times a day (BID) | ORAL | Status: DC

## 2018-04-02 NOTE — Patient Instructions (Signed)
Medication Instructions:  DECREASE ELIQUIS TO 2.5 MG TWICE DAILY= 1/2 OF THE 5 MG TABLET TWICE DAILY If you need a refill on your cardiac medications before your next appointment, please call your pharmacy.   Lab work: Your physician recommends that you HAVE LAB WORK TODAY If you have labs (blood work) drawn today and your tests are completely normal, you will receive your results only by: Marland Kitchen MyChart Message (if you have MyChart) OR . A paper copy in the mail If you have any lab test that is abnormal or we need to change your treatment, we will call you to review the results.  Follow-Up: At [facility], you and your health needs are our priority.  As part of our continuing mission to provide you with exceptional heart care, we have created designated Provider Care Teams.  These Care Teams include your primary Cardiologist (physician) and Advanced Practice Providers (APPs -  Physician Assistants and Nurse Practitioners) who all work together to provide you with the care you need, when you need it.  Your physician recommends that you schedule a follow-up appointment in: [place] physician recommends that you schedule a follow-up appointment in: [place]

## 2018-04-03 ENCOUNTER — Encounter: Payer: Self-pay | Admitting: *Deleted

## 2018-04-06 ENCOUNTER — Encounter (HOSPITAL_COMMUNITY): Payer: Self-pay

## 2018-04-06 ENCOUNTER — Ambulatory Visit (HOSPITAL_COMMUNITY)
Admission: RE | Admit: 2018-04-06 | Discharge: 2018-04-06 | Disposition: A | Discharge status: Home / Self Care | Payer: Medicare Other | Source: Ambulatory Visit | Attending: Interventional Radiology | Admitting: Interventional Radiology

## 2018-04-06 DIAGNOSIS — I639 Cerebral infarction, unspecified: Secondary | ICD-10-CM

## 2018-04-06 DIAGNOSIS — I6601 Occlusion and stenosis of right middle cerebral artery: Secondary | ICD-10-CM | POA: Diagnosis not present

## 2018-04-06 HISTORY — DX: Cerebral infarction, unspecified: I63.9

## 2018-04-06 NOTE — Progress Notes (Signed)
Chief Complaint: Patient was seen in consultation today for right MCA M1 segment occlusion s/p revascularization AND right PCOM aneurysm.  Referring Physician(s): CODE STROKE- [name]  Supervising Physician: [name]  Patient Status: [name] - Out-pt  History of Present Illness: Haley Costa is a 85 y.o. female with a past medical history of hypertension, high cholesterol, HF, atrial fibrillation on chronic anticoagulation with Eliquis, heart , LBBB, CVA 02/2018, pneumonia, nephrolithiasis, breast cancer 1992 s/p mastectomy and chemotherapy, chronic back pain, and osteoarthritis. She is known to [facility] and has been followed by Dr. [name] since 02/2018. She first presented to our department as an active code stroke. She presented to [name] ED 02/28/2018 with complaint of left sided weakness. She was found to have an acute ischemic stroke secondary to right MCA M1 segment occlusion and was admitted for management. She underwent an emergent image-guided cerebral angiogram with mechanical thrombectomy of her right MCA M1 segment occlusion achieving a TICI 3 revascularization 02/28/2018 by Dr. [name]. Of note, during procedure she was also found to have a right PCOM aneurysm. She was discharged home 03/03/2018 in stable condition.  Patient presents today for follow-up regarding her recent stroke and procedure 02/28/2018. Patient awake and alert sitting in wheelchair. Accompanied by granddaughter. Complains of left-sided weakness, improved since discharge. States that she sees PT/OT for this. Complains of intermittent right eye blurred vision. Describes it as a "film over my eye". Denies headache, numbness/tingling, dizziness, diplopia/curtain over eyes/blindness, hearing changes, tinnitus, or speech difficulty.  Patient is currently taking Eliquis 2.5 mg twice daily.   Past Medical History:  Diagnosis Date  . Breast cancer, left breast ([place]) 1992   S/P mastectomy &  chemo  . CHF (congestive heart failure) ([place])   . Chronic back pain    "lower and middle back; I've had several broken vertebrae" (12/09/2017)  . Heart murmur   . High cholesterol   . History of kidney stones   . Hypertension   . LBBB (left bundle branch block)    Patient states that she was told by her PCP that she has left bundle blockage  . Osteoarthritis    "some of the joints" (12/09/2017)  . Pneumonia    "when I was a child" (12/09/2017)  . PONV (postoperative nausea and vomiting)     Past Surgical History:  Procedure Laterality Date  . APPENDECTOMY    . BACK SURGERY    . CATARACT EXTRACTION W/ INTRAOCULAR LENS IMPLANT Right   . FIXATION KYPHOPLASTY THORACIC SPINE  2014  . IR CT HEAD LTD  02/28/2018  . IR PERCUTANEOUS ART THROMBECTOMY/INFUSION INTRACRANIAL INC DIAG ANGIO  02/28/2018  . MASTECTOMY Left 1992  . RADIOLOGY WITH ANESTHESIA N/A 02/28/2018   Procedure: RADIOLOGY WITH ANESTHESIA;  Surgeon: [name], MD;  Location: [place];  Service: Radiology;  Laterality: N/A;    Allergies: Chlorthalidone; Lisinopril; and Persantine [dipyridamole]  Medications: Prior to Admission medications   Medication Sig Start Date End Date Taking? Authorizing Provider  apixaban (ELIQUIS) 2.5 MG TABS tablet Take 1 tablet (2.5 mg total) by mouth 2 (two) times daily. 04/02/18   [name], MD  carvedilol (COREG) 6.25 MG tablet Take 1 tablet (6.25 mg total) by mouth 2 (two) times daily with a meal. 02/04/18   [name], [name], MD  furosemide (LASIX) 40 MG tablet Take 0.5 tablets (20 mg total) by mouth daily. Patient taking differently: Take 40 mg by mouth daily.  12/10/17   [name], [name], MD  losartan (COZAAR) 25 MG tablet Take 1 tablet (25 mg total) by mouth daily. 11/26/17   [name], [name], MD  nitroGLYCERIN (NITROSTAT) 0.4 MG SL tablet Place 1 tablet (0.4 mg total) under the tongue every 5 (five) minutes as needed for chest pain. 03/11/18   [name], MD    potassium chloride 20 MEQ TBCR Take 20 mEq by mouth daily. 12/10/17   [name], [name], MD     Family History  Problem Relation Age of Onset  . Cervical cancer Mother   . Hypertension Brother     Social History   Socioeconomic History  . Marital status: Widowed    Spouse name: Not on file  . Number of children: Not on file  . Years of education: Not on file  . Highest education level: Not on file  Occupational History  . Not on file  Social Needs  . Financial resource strain: Not on file  . Food insecurity:    Worry: Not on file    Inability: Not on file  . Transportation needs:    Medical: Not on file    Non-medical: Not on file  Tobacco Use  . Smoking status: Never Smoker  . Smokeless tobacco: Never Used  Substance and Sexual Activity  . Alcohol use: Never    Frequency: Never  . Drug use: Never  . Sexual activity: Not on file  Lifestyle  . Physical activity:    Days per week: Not on file    Minutes per session: Not on file  . Stress: Not on file  Relationships  . Social connections:    Talks on phone: Not on file    Gets together: Not on file    Attends religious service: Not on file    Active member of club or organization: Not on file    Attends meetings of clubs or organizations: Not on file    Relationship status: Not on file  Other Topics Concern  . Not on file  Social History Narrative  . Not on file     Review of Systems: A 12 point ROS discussed and pertinent positives are indicated in the HPI above.  All other systems are negative.  Review of Systems  Constitutional: Negative for chills and fever.  HENT: Negative for hearing loss and tinnitus.   Eyes: Positive for visual disturbance.  Respiratory: Negative for shortness of breath and wheezing.   Cardiovascular: Negative for chest pain and palpitations.  Neurological: Positive for dizziness. Negative for speech difficulty, weakness, numbness and headaches.  Psychiatric/Behavioral: Negative  for behavioral problems and confusion.    Vital Signs: There were no vitals taken for this visit.  Physical Exam Constitutional:      General: She is not in acute distress.    Appearance: Normal appearance.  Pulmonary:     Effort: Pulmonary effort is normal. No respiratory distress.  Skin:    General: Skin is warm and dry.  Neurological:     Mental Status: She is alert and oriented to person, place, and time.  Psychiatric:        Mood and Affect: Mood normal.        Behavior: Behavior normal.        Thought Content: Thought content normal.        Judgment: Judgment normal.      Imaging: Dg Chest 2 View  Result Date: 03/21/2018 CLINICAL DATA:  Left flank/abdominal pain since last night. EXAM: CHEST - 2 VIEW COMPARISON:  03/11/2018 FINDINGS:  Lungs are adequately inflated demonstrate bibasilar opacification with mild interval improvement likely small effusions with bibasilar atelectasis. Mild stable cardiomegaly. Remainder of the exam is unchanged. IMPRESSION: Improving bibasilar opacification likely improved effusions/atelectasis. Mild stable cardiomegaly. Electronically Signed   By: Marin Olp M.D.   On: 03/21/2018 08:48   Dg Chest 2 View  Result Date: 03/11/2018 CLINICAL DATA:  Acute onset of generalized chest pain and pressure. Patient on Eliquis. EXAM: CHEST - 2 VIEW COMPARISON:  Chest radiograph performed 03/02/2018 FINDINGS: Small bilateral pleural effusions are noted. Mild bibasilar airspace opacities, new from the prior study, may reflect atelectasis or possibly mild infection. No pneumothorax is seen. The cardiomediastinal silhouette is mildly enlarged. No acute osseous abnormalities are identified. Postoperative change is noted at the left axilla. Changes of vertebroplasty are noted at the upper lumbar spine. IMPRESSION: 1. Small bilateral pleural effusions. Mild bibasilar airspace opacities, new from the prior study, may reflect atelectasis or possibly mild infection. 2.  Mild cardiomegaly. Electronically Signed   By: Garald Balding M.D.   On: 03/11/2018 02:18    Labs:  CBC: Recent Labs    03/02/18 0712 03/03/18 0307 03/11/18 0157 03/21/18 0828  WBC 7.8 7.7 6.4 4.2  HGB 11.2* 10.5* 11.4* 12.2  HCT 35.3* 31.9* 35.2* 37.7  PLT 189 179 267 222    COAGS: Recent Labs    02/28/18 0303  INR 1.02  APTT 29    BMP: Recent Labs    03/03/18 0307 03/11/18 0157 03/21/18 0828 04/02/18 1031  NA 141 136 137 141  K 3.7 3.7 3.9 4.4  CL 108 98 103 100  CO2 23 25 24 24   GLUCOSE 91 129* 117* 96  BUN 27* 16 14 24   CALCIUM 8.5* 8.9 9.1 9.5  CREATININE 1.00 1.06* 1.02* 1.06*  GFRNONAA 51* 47* 49* 47*  GFRAA 59* 55* 57* 55*    LIVER FUNCTION TESTS: Recent Labs    12/09/17 1120 02/28/18 0303 03/01/18 0158 03/21/18 0828  BILITOT 1.3* 1.8* 1.9* 1.3*  AST 26 54* 28 25  ALT 17 57* 36 23  ALKPHOS 87 92 72 50  PROT 6.2* 6.3* 5.2* 5.9*  ALBUMIN 3.5 3.5 2.8* 3.4*    TUMOR MARKERS: No results for input(s): AFPTM, CEA, CA199, CHROMGRNA in the last 8760 hours.  Assessment and Plan:  Right MCA M1 segment occlusion s/p emergent mechanical thrombectomy achieving a TICI 3 revascularization 02/28/2018 by Dr. [name]. Right PCOM aneurysm. Reviewed imaging with patient and granddaughter. First brought to their attention was patient's right MCA M1 segment occlusion, both prior to and following mechanical thrombectomy. From stroke standpoint, patient is doing well and almost back to baseline. Next brought to their attention was patient's right PCOM aneurysm. Explained that this is the probable cause of her intermittent right eye blurred vision. Discussed the nature of aneurysms including risk of rupture being 1-3% per year. Explained that there are two management options for her right PCOM aneurysm- either with routine imaging scans to monitor for changes or with a procedure called an image-guided cerebral arteriogram with endovascular intervention. Explained  procedure including risks and benefits. Informed patient that if we are to move forward with intervention, she will need to take Plavix 75 mg once daily and Aspirin 81 mg once daily for 7 days prior to procedure. Explained that the earliest we would pursue the procedure would be in 3-4 weeks so patient has more time to recuperate from her stroke. In addition, explained that if patient chooses to move forward with  intervention, we will first need cardiac clearance from Dr. Stanford Breed. Patient asks that she has time to think of which management option to pursue. Instructed patient to call us once she has made her decision.  Plan for follow-up with routine imaging scans to monitor for changes versus an image-guided cerebral arteriogram with possible right PCOM aneurysm embolization. Patient to call us once she has made her decision. Instructed patient to continue taking Eliquis 2.5 mg twice daily.  All questions answered and concerns addressed. Patient and granddaughter convey understanding and agree with plan.  Thank you for this interesting consult.  I greatly enjoyed meeting [employer] and look forward to participating in their care.  A copy of this report was sent to the requesting provider on this date.  Electronically Signed: Earley Abide, PA-C 04/06/2018, 7:37 AM   I spent a total of 40 Minutes in face to face in clinical consultation, greater than 50% of which was counseling/coordinating care for right MCA M1 segment occlusion s/p revascularization AND right PCOM aneurysm.

## 2018-04-08 DIAGNOSIS — I110 Hypertensive heart disease with heart failure: Secondary | ICD-10-CM | POA: Diagnosis not present

## 2018-04-08 DIAGNOSIS — I69354 Hemiplegia and hemiparesis following cerebral infarction affecting left non-dominant side: Secondary | ICD-10-CM | POA: Diagnosis not present

## 2018-04-08 DIAGNOSIS — I4891 Unspecified atrial fibrillation: Secondary | ICD-10-CM | POA: Diagnosis not present

## 2018-04-08 DIAGNOSIS — I509 Heart failure, unspecified: Secondary | ICD-10-CM | POA: Diagnosis not present

## 2018-04-08 DIAGNOSIS — E785 Hyperlipidemia, unspecified: Secondary | ICD-10-CM | POA: Diagnosis not present

## 2018-04-08 DIAGNOSIS — Z7901 Long term (current) use of anticoagulants: Secondary | ICD-10-CM | POA: Diagnosis not present

## 2018-04-08 NOTE — Progress Notes (Deleted)
[facility] [address] [place]. [place] [address] [phone]       OFFICE FOLLOW UP NOTE  [name] Date of Birth:  June 22, 1928 Medical Record Number:  [id number]   Reason for Referral:  hospital stroke follow up  CHIEF COMPLAINT:  No chief complaint on file.   HPI: Haley Costa is being seen today for initial visit in the office for right MCA small infarcts in right BG and periventricular due to right M1 occlusion s/p IR with TICI 3 reperfusion and SAH embolic source likely due to severe CHF and newly diagnosed A. Fib on 02/28/2018. History obtained from *** and chart review. Reviewed all radiology images and labs personally.  Ms.Haley Costa a 85 y.o.femalewith history of CHF with EF 25 to 30%, HTN, HLD, LBBB and left breast cancer status post surgery and chemotherapy who presented with Ltsided weakness.Shedid not receive IV t-PA due to late presentation.   CT head reviewed and was negative for acute infarct with likely chronic left occipital lobe infarct.  CTA head and neck showed emergent right M1 occlusion.  CT perfusion positive for penumbra.  Patient transferred to IR for revascularization of occluded right MCA M1 achieving TICI 3 revascularization.  MRI brain reviewed and showed right BG and periventricular small infarct with small postprocedural right sylvian fissure SAH.  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  2D echo showed an EF of 25 to 30% without cardiac source of embolus identified.  LDL 98 A1c 5.3.  Etiology of stroke likely embolic due to severe CHF and newly diagnosed A. fib which was found on telemetry monitoring.  Recommended initiation of Eliquis for secondary stroke prevention.  HTN stable during admission recommended long-term BP goal normotensive range.  Continuation of Crestor 5 mg daily for HTN management.  Recommended outpatient follow-up with Dr. [name] for incidental finding of right PCOM aneurysm.   Other stroke risk factors include advanced age and history of stroke/TIA.  Patient discharged home in stable condition with recommendations of home health PT/OT.      ROS:   14 system review of systems performed and negative with exception of ***  PMH:  Past Medical History:  Diagnosis Date  . Breast cancer, left breast ([facility]) 1992   S/P mastectomy & chemo  . CHF (congestive heart failure) ([place])   . Chronic back pain    "lower and middle back; I've had several broken vertebrae" (12/09/2017)  . CVA (cerebral vascular accident) ([place]) 02/2018  . Heart murmur   . High cholesterol   . History of kidney stones   . Hypertension   . LBBB (left bundle branch block)    Patient states that she was told by her PCP that she has left bundle blockage  . Osteoarthritis    "some of the joints" (12/09/2017)  . Pneumonia    "when I was a child" (12/09/2017)  . PONV (postoperative nausea and vomiting)     PSH:  Past Surgical History:  Procedure Laterality Date  . APPENDECTOMY    . BACK SURGERY    . CATARACT EXTRACTION W/ INTRAOCULAR LENS IMPLANT Right   . FIXATION KYPHOPLASTY THORACIC SPINE  2014  . IR CT HEAD LTD  02/28/2018  . IR PERCUTANEOUS ART THROMBECTOMY/INFUSION INTRACRANIAL INC DIAG ANGIO  02/28/2018  . MASTECTOMY Left 1992  . RADIOLOGY WITH ANESTHESIA N/A 02/28/2018   Procedure: RADIOLOGY WITH ANESTHESIA;  Surgeon: [name], MD;  Location: [facility];  Service: Radiology;  Laterality: N/A;    Social History:  Social History   Socioeconomic History  . Marital status: Widowed    Spouse name: Not on file  . Number of children: Not on file  . Years of education: Not on file  . Highest education level: Not on file  Occupational History  . Not on file  Social Needs  . Financial resource strain: Not on file  . Food insecurity:    Worry: Not on file    Inability: Not on file  . Transportation needs:    Medical: Not on file    Non-medical: Not on file  Tobacco Use  .  Smoking status: Never Smoker  . Smokeless tobacco: Never Used  Substance and Sexual Activity  . Alcohol use: Never    Frequency: Never  . Drug use: Never  . Sexual activity: Not on file  Lifestyle  . Physical activity:    Days per week: Not on file    Minutes per session: Not on file  . Stress: Not on file  Relationships  . Social connections:    Talks on phone: Not on file    Gets together: Not on file    Attends religious service: Not on file    Active member of club or organization: Not on file    Attends meetings of clubs or organizations: Not on file    Relationship status: Not on file  . Intimate partner violence:    Fear of current or ex partner: Not on file    Emotionally abused: Not on file    Physically abused: Not on file    Forced sexual activity: Not on file  Other Topics Concern  . Not on file  Social History Narrative  . Not on file    Family History:  Family History  Problem Relation Age of Onset  . Cervical cancer Mother   . Hypertension Brother     Medications:   Current Outpatient Medications on File Prior to Visit  Medication Sig Dispense Refill  . apixaban (ELIQUIS) 2.5 MG TABS tablet Take 1 tablet (2.5 mg total) by mouth 2 (two) times daily.    . carvedilol (COREG) 6.25 MG tablet Take 1 tablet (6.25 mg total) by mouth 2 (two) times daily with a meal. 180 tablet 3  . furosemide (LASIX) 40 MG tablet Take 0.5 tablets (20 mg total) by mouth daily. (Patient taking differently: Take 40 mg by mouth daily. ) 30 tablet 0  . losartan (COZAAR) 25 MG tablet Take 1 tablet (25 mg total) by mouth daily. 30 tablet 0  . nitroGLYCERIN (NITROSTAT) 0.4 MG SL tablet Place 1 tablet (0.4 mg total) under the tongue every 5 (five) minutes as needed for chest pain. 30 tablet 0  . potassium chloride 20 MEQ TBCR Take 20 mEq by mouth daily. 30 tablet 0   No current facility-administered medications on file prior to visit.     Allergies:   Allergies  Allergen Reactions    . Chlorthalidone Swelling  . Lisinopril Swelling    Eye  And cheek swelling  . Persantine [Dipyridamole] Other (See Comments)    unknown     Physical Exam  There were no vitals filed for this visit. There is no height or weight on file to calculate BMI. No exam data present  General: well developed, well nourished, seated, in no evident distress Head: head normocephalic and atraumatic.   Neck: supple with no carotid or supraclavicular bruits Cardiovascular: regular rate and rhythm, no  murmurs Musculoskeletal: no deformity Skin:  no rash/petichiae Vascular:  Normal pulses all extremities  Neurologic Exam Mental Status: Awake and fully alert. Oriented to place and time. Recent and remote memory intact. Attention span, concentration and fund of knowledge appropriate. Mood and affect appropriate.  Cranial Nerves: Fundoscopic exam reveals sharp disc margins. Pupils equal, briskly reactive to light. Extraocular movements full without nystagmus. Visual fields full to confrontation. Hearing intact. Facial sensation intact. Face, tongue, palate moves normally and symmetrically.  Motor: Normal bulk and tone. Normal strength in all tested extremity muscles. Sensory.: intact to touch , pinprick , position and vibratory sensation.  Coordination: Rapid alternating movements normal in all extremities. Finger-to-nose and heel-to-shin performed accurately bilaterally. Gait and Station: Arises from chair without difficulty. Stance is normal. Gait demonstrates normal stride length and balance. Able to heel, toe and tandem walk without difficulty.  Reflexes: 1+ and symmetric. Toes downgoing.    NIHSS  *** Modified Rankin  *** CHA2DS2-VASc *** HAS-BLED ***   Diagnostic Data (Labs, Imaging, Testing)  Ct Head Code Stroke Wo Contrast 02/28/2018 1. No acute intracranial process.  2. ASPECTS is 10.  3. Small LEFT occipital lobe infarct, likely chronic. Otherwise negative CT HEAD without contrast  for age.   Ct Angio Head W Or Wo Contrast 02/28/2018 1. Emergent RIGHT M1 occlusion.Good collaterals by single-phase CTA.  2. 3 mm RIGHT paraophthalmic artery aneurysm.Neuro-Interventional Radiology consultation is suggested to evaluate the appropriateness of potential treatment.   Ct Angio Neck W Or Wo Contrast 02/28/2018 1. No hemodynamically significant stenosis ICA's. Patent vertebral arteries.  2. Small pleural effusions and findings of pulmonary edema. Aortic Atherosclerosis (ICD10-I70.0).  Emphysema (ICD10-J43.9).   Ct Cerebral Perfusion W Contrast 02/28/2018 1. RIGHT MCA penumbra.  DSA S/P RT common carotid arteriogram followed by complete revascularization of occluded RT MCA M 1 segwith x 1 pass with 58mm x 102mm embotrap retriever device achieving a TICI 3 revascularization. Approx 81mm x 6 mm bilobed RT PCOM aneurysm.  Mr Brain 28 Contrast Mr Virgel Paling Wo Contrast 02/28/2018 Small areas of restricted diffusion affecting the RIGHT putamen and RIGHT periventricular white matter, much smaller than the previously identified penumbra, consistent with acute RIGHT MCA territory infarction. These are nonhemorrhagic. Small volume subarachnoid hemorrhage in the RIGHT posterior sylvian fissure and over the convexity, [place] 3C class of postprocedural intracranial hemorrhage. Bilobed RIGHT PCOM/paraophthalmic aneurysm, 4 x 6 mm, stable.   Dg Chest Port 1 View 03/02/2018 Bilateral effusions and bilateral lower lobe atelectasis and or pneumonia. Electronically Signed By: Nelson Chimes M.D. On: 03/02/2018 10:22   2D Echocardiogram  - Left ventricle: The cavity size was normal. Wall thickness wasnormal. Systolic function was severely reduced. The estimatedejection fraction was in the range of 25% to 30%. Dyskinesis ofthe basal-midanteroseptal myocardium. - Aortic valve: Moderately calcified annulus. Moderately thickened,moderately calcified leaflets. Valve area (VTI):  2.58 cm^2. Valvearea (Vmax): 2.71 cm^2. Valve area (Vmean): 2.28 cm^2. - Mitral valve: There was severe regurgitation. Valve area bycontinuity equation (using LVOT flow): 1.42 cm^2. - Left atrium: The atrium was severely dilated. - Right ventricle: The cavity size was mildly decreased. Systolicfunction was moderately reduced. - Right atrium: The atrium was mildly dilated. - Tricuspid valve: There was moderate regurgitation. - Pulmonary arteries: Systolic pressure was moderately to severelyincreased. PA peak pressure: 60 mm Hg (S).    ASSESSMENT: Haley Costa is a 85 y.o. year old female here with right MCA infarcts including right BG and periventricular infarct on 02/27/2018 secondary to embolic source likely due  to severe CHF and newly diagnosed A. Fib s/p TICI 3 reperfusion of right M1 occlusion. Vascular risk factors include CHF with EF 25 to 30%, HTN, HLD, prior strokes, incidental finding right PCOM aneurysm and newly diagnosed A. fib.     PLAN:  1. Right MCA infarcts: Continue Eliquis (apixaban) daily  and Crestor for secondary stroke prevention. Maintain strict control of hypertension with blood pressure goal below 130/90, diabetes with hemoglobin A1c goal below 6.5% and cholesterol with LDL cholesterol (bad cholesterol) goal below 70 mg/dL.  I also advised the patient to eat a healthy diet with plenty of whole grains, cereals, fruits and vegetables, exercise regularly with at least 30 minutes of continuous activity daily and maintain ideal body weight. 2. HTN: Advised to continue current treatment regimen.  Today's BP ***.  Advised to continue to monitor at home along with continued follow-up with PCP for management 3. HLD: Advised to continue current treatment regimen along with continued follow-up with PCP for future prescribing and monitoring of lipid panel 4. Atrial fibrillation: Continue Eliquis 2.5 mg twice daily and follow-up with cardiology for continued  management 5. Right PCOM aneurysm: Continue to follow with vascular surgery -patient to decide whether to continue with routine imaging to monitor for changes versus undergo image guided cerebral arteriogram with possible right PCOM aneurysm embolization    Follow up in *** or call earlier if needed   Greater than 50% of time during this 25 minute visit was spent on counseling, explanation of diagnosis of right MCA infarcts, reviewing risk factor management of HTN, HLD and atrial fibrillation, planning of further management along with potential future management, and discussion with patient and family answering all questions.    Venancio Poisson, AGNP-BC  [facility] [address] [place], [place] [address]  Phone [phone] Fax [phone] Note: This document was prepared with digital dictation and possible smart phrase technology. Any transcriptional errors that result from this process are unintentional.

## 2018-04-09 ENCOUNTER — Ambulatory Visit: Payer: Medicare Other | Admitting: Adult Health

## 2018-04-09 ENCOUNTER — Telehealth: Payer: Self-pay

## 2018-04-09 NOTE — Telephone Encounter (Signed)
Patient no show for appointment today.

## 2018-04-10 ENCOUNTER — Encounter: Payer: Self-pay | Admitting: Adult Health

## 2018-04-13 DIAGNOSIS — E785 Hyperlipidemia, unspecified: Secondary | ICD-10-CM | POA: Diagnosis not present

## 2018-04-13 DIAGNOSIS — I110 Hypertensive heart disease with heart failure: Secondary | ICD-10-CM | POA: Diagnosis not present

## 2018-04-13 DIAGNOSIS — I69354 Hemiplegia and hemiparesis following cerebral infarction affecting left non-dominant side: Secondary | ICD-10-CM | POA: Diagnosis not present

## 2018-04-13 DIAGNOSIS — Z7901 Long term (current) use of anticoagulants: Secondary | ICD-10-CM | POA: Diagnosis not present

## 2018-04-13 DIAGNOSIS — I509 Heart failure, unspecified: Secondary | ICD-10-CM | POA: Diagnosis not present

## 2018-04-13 DIAGNOSIS — I4891 Unspecified atrial fibrillation: Secondary | ICD-10-CM | POA: Diagnosis not present

## 2018-04-14 ENCOUNTER — Telehealth (HOSPITAL_COMMUNITY): Payer: Self-pay

## 2018-04-14 NOTE — Telephone Encounter (Signed)
Called pt to find out her decision to have tx or f/u imaging scans, no answer, left vm. AW

## 2018-04-20 ENCOUNTER — Ambulatory Visit (HOSPITAL_COMMUNITY): Payer: Medicare Other | Attending: Cardiology

## 2018-04-20 DIAGNOSIS — I5022 Chronic systolic (congestive) heart failure: Secondary | ICD-10-CM | POA: Diagnosis not present

## 2018-04-23 DIAGNOSIS — K5900 Constipation, unspecified: Secondary | ICD-10-CM | POA: Diagnosis not present

## 2018-04-23 DIAGNOSIS — R32 Unspecified urinary incontinence: Secondary | ICD-10-CM | POA: Diagnosis not present

## 2018-04-23 DIAGNOSIS — I959 Hypotension, unspecified: Secondary | ICD-10-CM | POA: Diagnosis not present

## 2018-04-23 NOTE — Progress Notes (Signed)
The patient has been notified of the result and verbalized understanding.  All questions (if any) were answered and patient was informed that she may call back if she has any questions  Jacqulynn Cadet, [place] 04/23/2018 2:50 PM

## 2018-04-24 DIAGNOSIS — I110 Hypertensive heart disease with heart failure: Secondary | ICD-10-CM | POA: Diagnosis not present

## 2018-04-24 DIAGNOSIS — I509 Heart failure, unspecified: Secondary | ICD-10-CM | POA: Diagnosis not present

## 2018-04-24 DIAGNOSIS — I69344 Monoplegia of lower limb following cerebral infarction affecting left non-dominant side: Secondary | ICD-10-CM | POA: Diagnosis not present

## 2018-04-24 DIAGNOSIS — I480 Paroxysmal atrial fibrillation: Secondary | ICD-10-CM | POA: Diagnosis not present

## 2018-04-24 DIAGNOSIS — Z7901 Long term (current) use of anticoagulants: Secondary | ICD-10-CM | POA: Diagnosis not present

## 2018-04-25 ENCOUNTER — Other Ambulatory Visit: Payer: Self-pay

## 2018-04-25 ENCOUNTER — Encounter (HOSPITAL_COMMUNITY): Payer: Self-pay | Admitting: *Deleted

## 2018-04-25 ENCOUNTER — Observation Stay (HOSPITAL_COMMUNITY)
Admission: EM | Admit: 2018-04-25 | Discharge: 2018-04-26 | Disposition: A | Discharge status: Home / Self Care | Payer: Medicare Other | Attending: Family Medicine | Admitting: Family Medicine

## 2018-04-25 ENCOUNTER — Emergency Department (HOSPITAL_COMMUNITY): Payer: Medicare Other

## 2018-04-25 DIAGNOSIS — Z8673 Personal history of transient ischemic attack (TIA), and cerebral infarction without residual deficits: Secondary | ICD-10-CM | POA: Diagnosis not present

## 2018-04-25 DIAGNOSIS — R609 Edema, unspecified: Secondary | ICD-10-CM | POA: Diagnosis not present

## 2018-04-25 DIAGNOSIS — R1084 Generalized abdominal pain: Secondary | ICD-10-CM | POA: Diagnosis not present

## 2018-04-25 DIAGNOSIS — R52 Pain, unspecified: Secondary | ICD-10-CM | POA: Diagnosis not present

## 2018-04-25 DIAGNOSIS — I480 Paroxysmal atrial fibrillation: Secondary | ICD-10-CM | POA: Diagnosis not present

## 2018-04-25 DIAGNOSIS — K5641 Fecal impaction: Secondary | ICD-10-CM | POA: Diagnosis not present

## 2018-04-25 DIAGNOSIS — K529 Noninfective gastroenteritis and colitis, unspecified: Secondary | ICD-10-CM | POA: Diagnosis not present

## 2018-04-25 DIAGNOSIS — K5730 Diverticulosis of large intestine without perforation or abscess without bleeding: Secondary | ICD-10-CM | POA: Diagnosis not present

## 2018-04-25 DIAGNOSIS — K5289 Other specified noninfective gastroenteritis and colitis: Principal | ICD-10-CM | POA: Insufficient documentation

## 2018-04-25 DIAGNOSIS — I509 Heart failure, unspecified: Secondary | ICD-10-CM | POA: Diagnosis not present

## 2018-04-25 DIAGNOSIS — I110 Hypertensive heart disease with heart failure: Secondary | ICD-10-CM | POA: Diagnosis not present

## 2018-04-25 DIAGNOSIS — K5900 Constipation, unspecified: Secondary | ICD-10-CM | POA: Diagnosis not present

## 2018-04-25 DIAGNOSIS — K8020 Calculus of gallbladder without cholecystitis without obstruction: Secondary | ICD-10-CM | POA: Diagnosis not present

## 2018-04-25 DIAGNOSIS — I10 Essential (primary) hypertension: Secondary | ICD-10-CM

## 2018-04-25 DIAGNOSIS — Z7901 Long term (current) use of anticoagulants: Secondary | ICD-10-CM | POA: Diagnosis not present

## 2018-04-25 DIAGNOSIS — Z79899 Other long term (current) drug therapy: Secondary | ICD-10-CM | POA: Diagnosis not present

## 2018-04-25 DIAGNOSIS — R109 Unspecified abdominal pain: Secondary | ICD-10-CM | POA: Diagnosis present

## 2018-04-25 DIAGNOSIS — I447 Left bundle-branch block, unspecified: Secondary | ICD-10-CM | POA: Diagnosis not present

## 2018-04-25 LAB — COMPREHENSIVE METABOLIC PANEL
ALT: 20 U/L (ref 0–44)
AST: 21 U/L (ref 15–41)
Albumin: 3.2 g/dL — ABNORMAL LOW (ref 3.5–5.0)
Alkaline Phosphatase: 62 U/L (ref 38–126)
Anion gap: 14 (ref 5–15)
BUN: 22 mg/dL (ref 8–23)
CALCIUM: 8.5 mg/dL — AB (ref 8.9–10.3)
CO2: 23 mmol/L (ref 22–32)
CREATININE: 0.85 mg/dL (ref 0.44–1.00)
Chloride: 98 mmol/L (ref 98–111)
GFR calc Af Amer: >60 mL/min (ref >60)
Glucose, Bld: 125 mg/dL — ABNORMAL HIGH (ref 70–99)
Potassium: 3.5 mmol/L (ref 3.5–5.1)
Sodium: 135 mmol/L (ref 135–145)
Total Bilirubin: 2.3 mg/dL — ABNORMAL HIGH (ref 0.3–1.2)
Total Protein: 6 g/dL — ABNORMAL LOW (ref 6.5–8.1)

## 2018-04-25 LAB — CBC
HCT: 36 % (ref 36.0–46.0)
Hemoglobin: 11.9 g/dL — ABNORMAL LOW (ref 12.0–15.0)
MCH: 32 pg (ref 26.0–34.0)
MCHC: 33.1 g/dL (ref 30.0–36.0)
MCV: 96.8 fL (ref 80.0–100.0)
Platelets: 248 10*3/uL (ref 150–400)
RBC: 3.72 MIL/uL — ABNORMAL LOW (ref 3.87–5.11)
RDW: 14.6 % (ref 11.5–15.5)
WBC: 10.1 10*3/uL (ref 4.0–10.5)
nRBC: 0 % (ref 0.0–0.2)

## 2018-04-25 LAB — LIPASE, BLOOD: LIPASE: 25 U/L (ref 11–51)

## 2018-04-25 MED ORDER — SODIUM CHLORIDE 0.9% FLUSH: 3.0000 mL | Freq: Once | INTRAVENOUS | Status: DC

## 2018-04-25 MED ORDER — ONDANSETRON HCL 4 MG/2ML IJ SOLN: 4.0000 mg | Freq: Four times a day (QID) | INTRAMUSCULAR | Status: DC | PRN

## 2018-04-25 MED ORDER — CARVEDILOL 12.5 MG PO TABS
6.2500 mg | ORAL_TABLET | Freq: Two times a day (BID) | ORAL | Status: DC
  Administered 2018-04-26: 6.25 mg via ORAL
  Filled 2018-04-25: qty 1

## 2018-04-25 MED ORDER — SODIUM CHLORIDE 0.9 % IV SOLN
INTRAVENOUS | Status: DC
  Administered 2018-04-25: 22:00:00 via INTRAVENOUS

## 2018-04-25 MED ORDER — BELLADONNA ALKALOIDS-OPIUM 16.2-60 MG RE SUPP
1.0000 | Freq: Once | RECTAL | Status: AC
  Administered 2018-04-25: 1 via RECTAL
  Filled 2018-04-25: qty 1

## 2018-04-25 MED ORDER — AMOXICILLIN 500 MG PO CAPS: 500.0000 mg | ORAL_CAPSULE | Freq: Two times a day (BID) | ORAL | Status: DC

## 2018-04-25 MED ORDER — CIPROFLOXACIN IN D5W 400 MG/200ML IV SOLN
400.0000 mg | Freq: Once | INTRAVENOUS | Status: AC
  Administered 2018-04-25: 400 mg via INTRAVENOUS
  Filled 2018-04-25: qty 200

## 2018-04-25 MED ORDER — FENTANYL CITRATE (PF) 100 MCG/2ML IJ SOLN
25.0000 ug | INTRAMUSCULAR | Status: DC | PRN
  Administered 2018-04-25 – 2018-04-26 (×3): 25 ug via INTRAVENOUS
  Filled 2018-04-25 (×3): qty 2

## 2018-04-25 MED ORDER — IOHEXOL 300 MG/ML  SOLN
100.0000 mL | Freq: Once | INTRAMUSCULAR | Status: AC | PRN
  Administered 2018-04-25: 100 mL via INTRAVENOUS

## 2018-04-25 MED ORDER — ACETAMINOPHEN 325 MG PO TABS
650.0000 mg | ORAL_TABLET | Freq: Four times a day (QID) | ORAL | Status: DC | PRN
  Administered 2018-04-26 (×2): 650 mg via ORAL
  Filled 2018-04-25 (×2): qty 2

## 2018-04-25 MED ORDER — ONDANSETRON HCL 4 MG/2ML IJ SOLN
4.0000 mg | Freq: Once | INTRAMUSCULAR | Status: DC
  Filled 2018-04-25: qty 2

## 2018-04-25 MED ORDER — METRONIDAZOLE IN NACL 5-0.79 MG/ML-% IV SOLN
500.0000 mg | Freq: Once | INTRAVENOUS | Status: AC
  Administered 2018-04-26: 500 mg via INTRAVENOUS
  Filled 2018-04-25: qty 100

## 2018-04-25 MED ORDER — SENNOSIDES-DOCUSATE SODIUM 8.6-50 MG PO TABS: 1.0000 | ORAL_TABLET | Freq: Every day | ORAL | Status: DC

## 2018-04-25 MED ORDER — ONDANSETRON HCL 4 MG PO TABS: 4.0000 mg | ORAL_TABLET | Freq: Four times a day (QID) | ORAL | Status: DC | PRN

## 2018-04-25 MED ORDER — METRONIDAZOLE IN NACL 5-0.79 MG/ML-% IV SOLN: 500.0000 mg | Freq: Three times a day (TID) | INTRAVENOUS | Status: DC

## 2018-04-25 MED ORDER — LOSARTAN POTASSIUM 50 MG PO TABS
25.0000 mg | ORAL_TABLET | Freq: Every day | ORAL | Status: DC
  Administered 2018-04-26: 25 mg via ORAL
  Filled 2018-04-25: qty 1

## 2018-04-25 MED ORDER — APIXABAN 2.5 MG PO TABS
2.5000 mg | ORAL_TABLET | Freq: Two times a day (BID) | ORAL | Status: DC
  Administered 2018-04-26: 2.5 mg via ORAL
  Filled 2018-04-25: qty 1

## 2018-04-25 MED ORDER — ACETAMINOPHEN 650 MG RE SUPP: 650.0000 mg | Freq: Four times a day (QID) | RECTAL | Status: DC | PRN

## 2018-04-25 NOTE — ED Notes (Signed)
Pt c/o more pain in spite of the suppository

## 2018-04-25 NOTE — ED Provider Notes (Signed)
[facility] EMERGENCY DEPARTMENT Provider Note   CSN: [id number] Arrival date & time: 04/25/18  1948     History   Chief Complaint Chief Complaint  Patient presents with  . Abdominal Pain    HPI Haley Costa is a 85 y.o. female.  HPI  Elderly female with multiple medical issues presents with new abdominal pain. Pain is severe, sharp, worse in the lower abdomen, though with diffuse radiation. There is associated generalized discomfort, weakness, no chest pain, no dyspnea. No clear fever. Onset seems to been within the past few days, without improvement in spite of using Linzess, prescribed by her physician 3 days ago.  Is here with multiple family members who assist with the HPI. Notably, the patient has history of recent stroke, thrombectomy, has intermittently be been on anticoagulant. She denies any bright red blood per rectum, or hematemesis.   Past Medical History:  Diagnosis Date  . Breast cancer, left breast ([place]) 1992   S/P mastectomy & chemo  . CHF (congestive heart failure) ([place])   . Chronic back pain    "lower and middle back; I've had several broken vertebrae" (12/09/2017)  . CVA (cerebral vascular accident) ([place]) 02/2018  . Heart murmur   . High cholesterol   . History of kidney stones   . Hypertension   . LBBB (left bundle branch block)    Patient states that she was told by her PCP that she has left bundle blockage  . Osteoarthritis    "some of the joints" (12/09/2017)  . Pneumonia    "when I was a child" (12/09/2017)  . PONV (postoperative nausea and vomiting)     Patient Active Problem List   Diagnosis Date Noted  . Hyperlipidemia LDL goal <70 03/03/2018  . 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 Stroke ([place]) 02/28/2018  . Acute ischemic stroke [facility]), s/p mechanical throbmectomy 02/28/2018  . 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 AF (paroxysmal atrial fibrillation) ([place])   . Acute on chronic  respiratory failure ([place])   . Hyponatremia 12/09/2017  . Acute exacerbation of CHF (congestive heart failure) ([place]) 11/22/2017  . Chest pain 11/21/2017  . Acute CHF (congestive heart failure) ([place]) 11/21/2017  . HTN (hypertension) 11/21/2017    Past Surgical History:  Procedure Laterality Date  . APPENDECTOMY    . BACK SURGERY    . CATARACT EXTRACTION W/ INTRAOCULAR LENS IMPLANT Right   . FIXATION KYPHOPLASTY THORACIC SPINE  2014  . IR CT HEAD LTD  02/28/2018  . IR PERCUTANEOUS ART THROMBECTOMY/INFUSION INTRACRANIAL INC DIAG ANGIO  02/28/2018  . MASTECTOMY Left 1992  . RADIOLOGY WITH ANESTHESIA N/A 02/28/2018   Procedure: RADIOLOGY WITH ANESTHESIA;  Surgeon: [name], MD;  Location: [facility];  Service: Radiology;  Laterality: N/A;     OB History   No obstetric history on file.      Home Medications    Prior to Admission medications   Medication Sig Start Date End Date Taking? Authorizing Provider  amoxicillin (AMOXIL) 500 MG capsule Take 500 mg by mouth 2 (two) times daily. 04/23/18  Yes [provider]  apixaban (ELIQUIS) 2.5 MG TABS tablet Take 1 tablet (2.5 mg total) by mouth 2 (two) times daily. 04/02/18  Yes [name], MD  carvedilol (COREG) 6.25 MG tablet Take 1 tablet (6.25 mg total) by mouth 2 (two) times daily with a meal. 02/04/18  Yes [name], [name], MD  losartan (COZAAR) 25 MG tablet Take 1 tablet (  25 mg total) by mouth daily. 11/26/17  Yes [name], [name], MD  nitroGLYCERIN (NITROSTAT) 0.4 MG SL tablet Place 1 tablet (0.4 mg total) under the tongue every 5 (five) minutes as needed for chest pain. 03/11/18  Yes [name], MD  potassium chloride 20 MEQ TBCR Take 20 mEq by mouth daily. 12/10/17  Yes [name], [name], MD  furosemide (LASIX) 40 MG tablet Take 0.5 tablets (20 mg total) by mouth daily. Patient not taking: Reported on 04/25/2018 12/10/17   Jonetta Osgood, MD    Family History Family History  Problem Relation Age of  Onset  . Cervical cancer Mother   . Hypertension Brother     Social History Social History   Tobacco Use  . Smoking status: Never Smoker  . Smokeless tobacco: Never Used  Substance Use Topics  . Alcohol use: Never    Frequency: Never  . Drug use: Never     Allergies   Chlorthalidone; Lisinopril; and Persantine [dipyridamole]   Review of Systems Review of Systems  Constitutional:       Per HPI, otherwise negative  HENT:       Per HPI, otherwise negative  Respiratory:       Per HPI, otherwise negative  Cardiovascular:       Per HPI, otherwise negative  Gastrointestinal: Positive for abdominal pain and nausea. Negative for blood in stool and vomiting.  Endocrine:       Negative aside from HPI  Genitourinary:       Neg aside from HPI   Musculoskeletal:       Per HPI, otherwise negative  Skin: Negative.   Neurological: Positive for weakness. Negative for syncope.  Hematological: Bruises/bleeds easily.     Physical Exam Updated Vital Signs BP 134/80   Pulse 91   Temp (!) 97.3 F (36.3 C)   Resp 16   Ht 5\' 1"  (1.549 m)   Wt 57.2 kg   SpO2 97%   BMI 23.81 kg/m   Physical Exam Vitals signs and nursing note reviewed.  Constitutional:      General: She is not in acute distress.    Appearance: She is ill-appearing.  HENT:     Head: Normocephalic and atraumatic.  Eyes:     Conjunctiva/sclera: Conjunctivae normal.  Cardiovascular:     Rate and Rhythm: Normal rate and regular rhythm.  Pulmonary:     Effort: Pulmonary effort is normal. No respiratory distress.     Breath sounds: Normal breath sounds. No stridor.  Abdominal:     General: There is no distension.    Genitourinary:    Rectum: Tenderness and external hemorrhoid present.     Comments: Patient with liquidy stool, no obtainable stool can be retrieved manually Skin:    General: Skin is warm and dry.  Neurological:     Mental Status: She is alert and oriented to person, place, and time.      Cranial Nerves: No cranial nerve deficit.     Motor: Atrophy present.      ED Treatments / Results  Labs (all labs ordered are listed, but only abnormal results are displayed) Labs Reviewed  COMPREHENSIVE METABOLIC PANEL - Abnormal; Notable for the following components:      Result Value   Glucose, Bld 125 (*)    Calcium 8.5 (*)    Total Protein 6.0 (*)    Albumin 3.2 (*)    Total Bilirubin 2.3 (*)    All other components within normal limits  CBC - Abnormal; Notable for the following components:   RBC 3.72 (*)    Hemoglobin 11.9 (*)    All other components within normal limits  LIPASE, BLOOD  URINALYSIS, ROUTINE W REFLEX MICROSCOPIC   Radiology Ct Abdomen Pelvis W Contrast  Result Date: 04/25/2018 CLINICAL DATA:  85 y/o F; tenderness to the abdomen, constipation, hemorrhoids. EXAM: CT ABDOMEN AND PELVIS WITH CONTRAST TECHNIQUE: Multidetector CT imaging of the abdomen and pelvis was performed using the standard protocol following bolus administration of intravenous contrast. CONTRAST:  164mL OMNIPAQUE IOHEXOL 300 MG/ML  SOLN COMPARISON:  10/09/2017 lumbar spine radiographs. FINDINGS: Lower chest: Small bilateral pleural effusions. Minor dependent atelectasis of the lower lobes. Moderate to severe cardiomegaly. Mild interlobular septal thickening at the lung bases. Hepatobiliary: No focal liver abnormality is seen. Mild periportal edema, probably related to congestive heart failure. Cholelithiasis, no gallbladder wall thickening, and no biliary dilatation. Pancreas: Unremarkable. No pancreatic ductal dilatation or surrounding inflammatory changes. Spleen: Normal in size without focal abnormality. Adrenals/Urinary Tract: Adrenal glands are unremarkable. Right kidney interpolar 18 mm simple cyst. Additional tiny cortical lucencies in the kidneys bilaterally likely representing small cysts. Otherwise kidneys are normal, without renal calculi, focal lesion, or hydronephrosis. Bladder is  distended. Stomach/Bowel: Large volume of stool in the rectum with wall thickening and perirectal edema. No additional obstructive or inflammatory changes of the bowel. Sigmoid diverticulosis without findings of acute diverticulitis. Appendix not identified, no pericecal inflammation. Vascular/Lymphatic: Aortic atherosclerosis. No enlarged abdominal or pelvic lymph nodes. Reproductive: Uterus and bilateral adnexa are unremarkable. Other: No abdominal wall hernia or abnormality. No abdominopelvic ascites. Musculoskeletal: Stable moderate T12, mild L1, mild L2, mild L3, and mild L5 compression deformities. Interval mild L4 compression deformity, age indeterminate. Post augmentation of T12 and L1. IMPRESSION: 1. Large volume of stool in the rectum with wall thickening and perirectal edema may represent stercoral colitis. 2. Small bilateral pleural effusions. Mild interstitial edema. Moderate to severe cardiomegaly. 3. Cholelithiasis. 4. Sigmoid diverticulosis without findings of acute diverticulitis. 5. Interval mild L4 compression deformity, age indeterminate. 6. Aortic Atherosclerosis (ICD10-I70.0). Electronically Signed   By: Kristine Garbe M.D.   On: 04/25/2018 22:20    Procedures Procedures (including critical care time)  Medications Ordered in ED Medications  sodium chloride flush (NS) 0.9 % injection 3 mL (has no administration in time range)  0.9 %  sodium chloride infusion ( Intravenous New Bag/Given 04/25/18 2211)  ondansetron (ZOFRAN) injection 4 mg (4 mg Intravenous Not Given 04/25/18 2210)  fentaNYL (SUBLIMAZE) injection 25 mcg (has no administration in time range)  ciprofloxacin (CIPRO) IVPB 400 mg (has no administration in time range)  metroNIDAZOLE (FLAGYL) IVPB 500 mg (has no administration in time range)  opium-belladonna (B&O SUPPRETTES) 16.2-60 MG suppository 1 suppository (1 suppository Rectal Given 04/25/18 2300)  iohexol (OMNIPAQUE) 300 MG/ML solution 100 mL (100 mLs  Intravenous Contrast Given 04/25/18 2150)     Initial Impression / Assessment and Plan / ED Course  I have reviewed the triage vital signs and the nursing notes.  Pertinent labs & imaging results that were available during my care of the patient were reviewed by me and considered in my medical decision making (see chart for details).    11:39 PM Attempted disimpaction, with rectal exam, placement of belladonna opiate suppository performed. No stool was able to be obtained, though there is liquidy soft stool in the rectal vault. Notable tenderness with palpation.  Elderly female presents with worsening lower abdominal pain diminished bowel output, and given  these findings, there is concern for infection versus obstruction. Labs generally reassuring, and the patient is afebrile, however the patient is found to have distended colon, concern for stercoral colitis. Patient received analgesia, rectally and fentanyl via IV. Patient had some improvement, but given the concern for her discomfort, distended colon, though there is no current evidence for ischemia, she was started on prophylactic antibiotics, will be monitored, managed.  Enema pending, though the patient just received rectal belladonna opiate, this may be left in place for some time prior to the enema, for relief.  Final Clinical Impressions(s) / ED Diagnoses  Stercoral colitis   Carmin Muskrat, MD 04/25/18 2342

## 2018-04-25 NOTE — ED Notes (Signed)
Pt on the [place].

## 2018-04-25 NOTE — ED Notes (Signed)
Admitting doctor at  The bedside 

## 2018-04-25 NOTE — ED Notes (Signed)
To ct

## 2018-04-25 NOTE — ED Triage Notes (Signed)
The pt arrived by [facility] from home abd pain for 2 weeks with constipation  She saw her doctor Thursday dx hemotthoids  Alert oriented skin warm and dry  Her daughter is on the way here  She lives with her daughter

## 2018-04-25 NOTE — H&P (Signed)
History and Physical    Haley Costa QJJ:[id number] DOB: 1928/12/29 DOA: 04/25/2018  PCP: [name], DO  Patient coming from: Home  I have personally briefly reviewed patient's old medical records in [facility] [place]  Chief Complaint: Abd pain  HPI: Haley Costa is a 85 y.o. female with medical history significant of PAF, stroke, on eliquis, HTN, CHF.  Patient presents to the ED with c/o abd pain.  Pain is sharp, severe, worse in lower abdomen.  Onset within past few days without improvement despite linzess.  PCP thought she had UTI so prescribed amoxicillin.  No BRBPR nor hematemesis, nor melena.   ED Course: Found to have constipation with large stool volume in rectum.  Stercoral colitis on CT.  Given doses of cipro / flagyl, and rectal beladonna, and hospitalist asked to admit.   Review of Systems: As per HPI otherwise 10 point review of systems negative.   Past Medical History:  Diagnosis Date  . Breast cancer, left breast ([place] [address] [place]) 1992   S/P mastectomy & chemo  . CHF (congestive heart failure) ([place])   . Chronic back pain    "lower and middle back; I've had several broken vertebrae" (12/09/2017)  . CVA (cerebral vascular accident) ([place]) 02/2018  . Heart murmur   . High cholesterol   . History of kidney stones   . Hypertension   . LBBB (left bundle branch block)    Patient states that she was told by her PCP that she has left bundle blockage  . Osteoarthritis    "some of the joints" (12/09/2017)  . Pneumonia    "when I was a child" (12/09/2017)  . PONV (postoperative nausea and vomiting)     Past Surgical History:  Procedure Laterality Date  . APPENDECTOMY    . BACK SURGERY    . CATARACT EXTRACTION W/ INTRAOCULAR LENS IMPLANT Right   . FIXATION KYPHOPLASTY THORACIC SPINE  2014  . IR CT HEAD LTD  02/28/2018  . IR PERCUTANEOUS ART THROMBECTOMY/INFUSION INTRACRANIAL INC DIAG ANGIO  02/28/2018  . MASTECTOMY Left 1992  . RADIOLOGY WITH ANESTHESIA N/A  02/28/2018   Procedure: RADIOLOGY WITH ANESTHESIA;  Surgeon: [name], MD;  Location: [facility];  Service: Radiology;  Laterality: N/A;     reports that she has never smoked. She has never used smokeless tobacco. She reports that she does not drink alcohol or use drugs.  Allergies  Allergen Reactions  . Chlorthalidone Swelling  . Lisinopril Swelling    Eye  And cheek swelling  . Persantine [Dipyridamole] Other (See Comments)    unknown    Family History  Problem Relation Age of Onset  . Cervical cancer Mother   . Hypertension Brother      Prior to Admission medications   Medication Sig Start Date End Date Taking? Authorizing Provider  amoxicillin (AMOXIL) 500 MG capsule Take 500 mg by mouth 2 (two) times daily. 04/23/18  Yes [provider]  apixaban (ELIQUIS) 2.5 MG TABS tablet Take 1 tablet (2.5 mg total) by mouth 2 (two) times daily. 04/02/18  Yes [name], MD  carvedilol (COREG) 6.25 MG tablet Take 1 tablet (6.25 mg total) by mouth 2 (two) times daily with a meal. 02/04/18  Yes [name], [name], MD  losartan (COZAAR) 25 MG tablet Take 1 tablet (25 mg total) by mouth daily. 11/26/17  Yes [name], [name], MD  nitroGLYCERIN (NITROSTAT) 0.4 MG SL tablet Place 1 tablet (0.4 mg total) under the tongue every 5 (five)  minutes as needed for chest pain. 03/11/18  Yes [name], MD  potassium chloride 20 MEQ TBCR Take 20 mEq by mouth daily. 12/10/17  Yes GhimireHenreitta Leber, MD    Physical Exam: Vitals:   04/25/18 2200 04/25/18 2230 04/25/18 2300 04/25/18 2330  BP: 140/65 123/81 134/80 128/79  Pulse: 89 92 91 93  Resp: 16 20 16  (!) 22  Temp:      SpO2: 98% 98% 97% 97%  Weight:      Height:        Constitutional: NAD, calm, comfortable Eyes: PERRL, lids and conjunctivae normal ENMT: Mucous membranes are moist. Posterior pharynx clear of any exudate or lesions.Normal dentition.  Neck: normal, supple, no masses, no thyromegaly Respiratory: clear to  auscultation bilaterally, no wheezing, no crackles. Normal respiratory effort. No accessory muscle use.  Cardiovascular: Regular rate and rhythm, no murmurs / rubs / gallops. No extremity edema. 2+ pedal pulses. No carotid bruits.  Abdomen: TTP, no rebound, no guarding Musculoskeletal: no clubbing / cyanosis. No joint deformity upper and lower extremities. Good ROM, no contractures. Normal muscle tone.  Skin: no rashes, lesions, ulcers. No induration Neurologic: CN 2-12 grossly intact. Sensation intact, DTR normal. Strength 5/5 in all 4.  Psychiatric: Normal judgment and insight. Alert and oriented x 3. Normal mood.    Labs on Admission: I have personally reviewed following labs and imaging studies  CBC: Recent Labs  Lab 04/25/18 2000  WBC 10.1  HGB 11.9*  HCT 36.0  MCV 96.8  PLT 097   Basic Metabolic Panel: Recent Labs  Lab 04/25/18 2000  NA 135  K 3.5  CL 98  CO2 23  GLUCOSE 125*  BUN 22  CREATININE 0.85  CALCIUM 8.5*   GFR: Estimated Creatinine Clearance: 35.2 mL/min (by C-G formula based on SCr of 0.85 mg/dL). Liver Function Tests: Recent Labs  Lab 04/25/18 2000  AST 21  ALT 20  ALKPHOS 62  BILITOT 2.3*  PROT 6.0*  ALBUMIN 3.2*   Recent Labs  Lab 04/25/18 2000  LIPASE 25   No results for input(s): AMMONIA in the last 168 hours. Coagulation Profile: No results for input(s): INR, PROTIME in the last 168 hours. Cardiac Enzymes: No results for input(s): CKTOTAL, CKMB, CKMBINDEX, TROPONINI in the last 168 hours. BNP (last 3 results) No results for input(s): PROBNP in the last 8760 hours. HbA1C: No results for input(s): HGBA1C in the last 72 hours. CBG: No results for input(s): GLUCAP in the last 168 hours. Lipid Profile: No results for input(s): CHOL, HDL, LDLCALC, TRIG, CHOLHDL, LDLDIRECT in the last 72 hours. Thyroid Function Tests: No results for input(s): TSH, T4TOTAL, FREET4, T3FREE, THYROIDAB in the last 72 hours. Anemia Panel: No results for  input(s): VITAMINB12, FOLATE, FERRITIN, TIBC, IRON, RETICCTPCT in the last 72 hours. Urine analysis:    Component Value Date/Time   COLORURINE AMBER (A) 03/21/2018 0935   APPEARANCEUR HAZY (A) 03/21/2018 0935   LABSPEC 1.018 03/21/2018 0935   PHURINE 5.0 03/21/2018 0935   GLUCOSEU NEGATIVE 03/21/2018 0935   HGBUR NEGATIVE 03/21/2018 0935   BILIRUBINUR NEGATIVE 03/21/2018 0935   KETONESUR NEGATIVE 03/21/2018 0935   PROTEINUR NEGATIVE 03/21/2018 0935   NITRITE NEGATIVE 03/21/2018 0935   LEUKOCYTESUR TRACE (A) 03/21/2018 0935    Radiological Exams on Admission: Ct Abdomen Pelvis W Contrast  Result Date: 04/25/2018 CLINICAL DATA:  85 y/o F; tenderness to the abdomen, constipation, hemorrhoids. EXAM: CT ABDOMEN AND PELVIS WITH CONTRAST TECHNIQUE: Multidetector CT imaging of the abdomen and  pelvis was performed using the standard protocol following bolus administration of intravenous contrast. CONTRAST:  130mL OMNIPAQUE IOHEXOL 300 MG/ML  SOLN COMPARISON:  10/09/2017 lumbar spine radiographs. FINDINGS: Lower chest: Small bilateral pleural effusions. Minor dependent atelectasis of the lower lobes. Moderate to severe cardiomegaly. Mild interlobular septal thickening at the lung bases. Hepatobiliary: No focal liver abnormality is seen. Mild periportal edema, probably related to congestive heart failure. Cholelithiasis, no gallbladder wall thickening, and no biliary dilatation. Pancreas: Unremarkable. No pancreatic ductal dilatation or surrounding inflammatory changes. Spleen: Normal in size without focal abnormality. Adrenals/Urinary Tract: Adrenal glands are unremarkable. Right kidney interpolar 18 mm simple cyst. Additional tiny cortical lucencies in the kidneys bilaterally likely representing small cysts. Otherwise kidneys are normal, without renal calculi, focal lesion, or hydronephrosis. Bladder is distended. Stomach/Bowel: Large volume of stool in the rectum with wall thickening and perirectal edema.  No additional obstructive or inflammatory changes of the bowel. Sigmoid diverticulosis without findings of acute diverticulitis. Appendix not identified, no pericecal inflammation. Vascular/Lymphatic: Aortic atherosclerosis. No enlarged abdominal or pelvic lymph nodes. Reproductive: Uterus and bilateral adnexa are unremarkable. Other: No abdominal wall hernia or abnormality. No abdominopelvic ascites. Musculoskeletal: Stable moderate T12, mild L1, mild L2, mild L3, and mild L5 compression deformities. Interval mild L4 compression deformity, age indeterminate. Post augmentation of T12 and L1. IMPRESSION: 1. Large volume of stool in the rectum with wall thickening and perirectal edema may represent stercoral colitis. 2. Small bilateral pleural effusions. Mild interstitial edema. Moderate to severe cardiomegaly. 3. Cholelithiasis. 4. Sigmoid diverticulosis without findings of acute diverticulitis. 5. Interval mild L4 compression deformity, age indeterminate. 6. Aortic Atherosclerosis (ICD10-I70.0). Electronically Signed   By: Kristine Garbe M.D.   On: 04/25/2018 22:20    EKG: Independently reviewed.  Assessment/Plan Principal Problem:   Constipation Active Problems:   HTN (hypertension)   AF (paroxysmal atrial fibrillation) (H[facility])    1. Constipation - 1. Will try Tap water enema first 2. If that fails consider fleets vs soap suds as second line 3. Ordering daily senna-docusate for further prevention 2. HTN - Continue home BP meds 3. AF - 1. Cont home meds 2. Cont eliquis  DVT prophylaxis: Eliquis Code Status: Full Family Communication: Family at bedside Disposition Plan: Home after admit Consults called: None Admission status: Place in obs   Radha Coggins, [facility]  How to contact the [facility] Attending or Consulting provider [place] or covering provider during after hours [place], for this patient?  1. Check the care team in [facility] and look for a) attending/consulting  TRH provider listed and b) the [facility] team listed 2. Log into [email or web address]  Amion Physician Scheduling and messaging for groups and whole hospitals  On call and physician scheduling software for group practices, residents, hospitalists and other medical providers for call, clinic, rotation and shift schedules. On[employer] is a hospital-wide system for scheduling doctors and paging doctors on call. EasyPlot is for scientific plotting and data analysis.  [email or web address]  and use [facility]'s universal password to access. If you do not have the password, please contact the hospital operator.  3. Locate the [facility] provider you are looking for under [facility] and page to a number that you can be directly reached. 4. If you still have difficulty reaching the provider, please page the [facility] (Director on Call) for the Hospitalists listed on amion for assistance.  04/25/2018, 11:57 PM

## 2018-04-26 ENCOUNTER — Other Ambulatory Visit: Payer: Self-pay

## 2018-04-26 ENCOUNTER — Observation Stay (HOSPITAL_COMMUNITY): Payer: Medicare Other

## 2018-04-26 DIAGNOSIS — I480 Paroxysmal atrial fibrillation: Secondary | ICD-10-CM | POA: Diagnosis not present

## 2018-04-26 DIAGNOSIS — K5641 Fecal impaction: Secondary | ICD-10-CM | POA: Diagnosis present

## 2018-04-26 DIAGNOSIS — K5289 Other specified noninfective gastroenteritis and colitis: Secondary | ICD-10-CM | POA: Diagnosis not present

## 2018-04-26 DIAGNOSIS — I10 Essential (primary) hypertension: Secondary | ICD-10-CM | POA: Diagnosis not present

## 2018-04-26 DIAGNOSIS — K5900 Constipation, unspecified: Secondary | ICD-10-CM | POA: Diagnosis not present

## 2018-04-26 LAB — BASIC METABOLIC PANEL
ANION GAP: 12 (ref 5–15)
BUN: 21 mg/dL (ref 8–23)
CO2: 22 mmol/L (ref 22–32)
Calcium: 8 mg/dL — ABNORMAL LOW (ref 8.9–10.3)
Chloride: 101 mmol/L (ref 98–111)
Creatinine, Ser: 0.75 mg/dL (ref 0.44–1.00)
GFR calc Af Amer: >60 mL/min (ref >60)
GFR calc non Af Amer: >60 mL/min (ref >60)
GLUCOSE: 102 mg/dL — AB (ref 70–99)
Potassium: 3.4 mmol/L — ABNORMAL LOW (ref 3.5–5.1)
Sodium: 135 mmol/L (ref 135–145)

## 2018-04-26 LAB — URINALYSIS, ROUTINE W REFLEX MICROSCOPIC
Bilirubin Urine: NEGATIVE
Glucose, UA: NEGATIVE mg/dL
Hgb urine dipstick: NEGATIVE
Ketones, ur: NEGATIVE mg/dL
Leukocytes, UA: NEGATIVE
Nitrite: NEGATIVE
Protein, ur: NEGATIVE mg/dL
SPECIFIC GRAVITY, URINE: 1.023 (ref 1.005–1.030)
pH: 5 (ref 5.0–8.0)

## 2018-04-26 LAB — CBC
HEMATOCRIT: 31.6 % — AB (ref 36.0–46.0)
Hemoglobin: 10.6 g/dL — ABNORMAL LOW (ref 12.0–15.0)
MCH: 32.2 pg (ref 26.0–34.0)
MCHC: 33.5 g/dL (ref 30.0–36.0)
MCV: 96 fL (ref 80.0–100.0)
Platelets: 207 10*3/uL (ref 150–400)
RBC: 3.29 MIL/uL — ABNORMAL LOW (ref 3.87–5.11)
RDW: 14.6 % (ref 11.5–15.5)
WBC: 9.2 10*3/uL (ref 4.0–10.5)
nRBC: 0 % (ref 0.0–0.2)

## 2018-04-26 MED ORDER — SENNOSIDES-DOCUSATE SODIUM 8.6-50 MG PO TABS: 1.0000 | ORAL_TABLET | Freq: Every day | ORAL | 0 refills | Status: DC

## 2018-04-26 MED ORDER — POLYETHYLENE GLYCOL 3350 17 G PO PACK
17.0000 g | PACK | Freq: Two times a day (BID) | ORAL | Status: DC
  Administered 2018-04-26: 17 g via ORAL
  Filled 2018-04-26: qty 1

## 2018-04-26 MED ORDER — POLYETHYLENE GLYCOL 3350 17 G PO PACK: 17.0000 g | PACK | Freq: Two times a day (BID) | ORAL | 0 refills | Status: AC

## 2018-04-26 NOTE — Discharge Instructions (Addendum)
Haley Costa,  You were in the hospital because of constipation. You received an enema which relieved most of the stool. I recommend you take Miralax once-twice daily. Your goal is to have a soft bowel movement every 1-2 days. If you have watery stools, please decrease your Miralax. There was no evidence of bowel blockage. Please follow-up with your primary care physician.    Information on my medicine - ELIQUIS (apixaban)  Why was Eliquis prescribed for you? Eliquis was prescribed for you to reduce the risk of a blood clot forming that can cause a stroke if you have a medical condition called atrial fibrillation (a type of irregular heartbeat).  What do You need to know about Eliquis ? Take your Eliquis TWICE DAILY - one tablet in the morning and one tablet in the evening with or without food. If you have difficulty swallowing the tablet whole please discuss with your pharmacist how to take the medication safely.  Take Eliquis exactly as prescribed by your doctor and DO NOT stop taking Eliquis without talking to the doctor who prescribed the medication.  Stopping may increase your risk of developing a stroke.  Refill your prescription before you run out.  After discharge, you should have regular check-up appointments with your healthcare provider that is prescribing your Eliquis.  In the future your dose may need to be changed if your kidney function or weight changes by a significant amount or as you get older.  What do you do if you miss a dose? If you miss a dose, take it as soon as you remember on the same day and resume taking twice daily.  Do not take more than one dose of ELIQUIS at the same time to make up a missed dose.  Important Safety Information A possible side effect of Eliquis is bleeding. You should call your healthcare provider right away if you experience any of the following: ? Bleeding from an injury or your nose that does not stop. ? Unusual colored urine  (red or dark brown) or unusual colored stools (red or black). ? Unusual bruising for unknown reasons. ? A serious fall or if you hit your head (even if there is no bleeding).  Some medicines may interact with Eliquis and might increase your risk of bleeding or clotting while on Eliquis. To help avoid this, consult your healthcare provider or pharmacist prior to using any new prescription or non-prescription medications, including herbals, vitamins, non-steroidal anti-inflammatory drugs (NSAIDs) and supplements.  This website has more information on Eliquis (apixaban): http://www.eliquis.com/eliquis/home

## 2018-04-26 NOTE — Discharge Summary (Signed)
Physician Discharge Summary  Haley Costa CBU:[id number] DOB: 1928/11/10 DOA: 04/25/2018  PCP: Haley Morning, DO  Admit date: 04/25/2018 Discharge date: 04/26/2018  Admitted From: Home Disposition: Home  Recommendations for Outpatient Follow-up:  1. Follow up with PCP in 1 week 2. Please obtain BMP/CBC in one week 3. Please follow up on the following pending results: None  Home Health: None Equipment/Devices: None  Discharge Condition: Stable CODE STATUS: Full code Diet recommendation: Heart healthy   Brief/Interim Summary:  Admission HPI written by Etta Quill, DO   Chief Complaint: Abd pain  HPI: Haley Costa is a 85 y.o. female with medical history significant of PAF, stroke, on eliquis, HTN, CHF.  Patient presents to the ED with c/o abd pain.  Pain is sharp, severe, worse in lower abdomen.  Onset within past few days without improvement despite linzess.  PCP thought she had UTI so prescribed amoxicillin.  No BRBPR nor hematemesis, nor melena.   Hospital course:  Constipation Evidence of sterocolitis on CT scan. Patient given antibiotics in the ED. Given an enema with resolution of fecal impaction. Recommend Senokot-S and Miralax as an outpatient.  Essential hypertension Continue Coreg and losartan.  Atrial fibrillation Rate controlled. Continue carvedilol and Eliquis.  Discharge Diagnoses:  Principal Problem:   Fecal impaction ([place]) Active Problems:   HTN (hypertension)   AF (paroxysmal atrial fibrillation) (H[facility])   Constipation    Discharge Instructions  Discharge Instructions    Increase activity slowly   Complete by:  As directed      Allergies as of 04/26/2018      Reactions   Chlorthalidone Swelling   Lisinopril Swelling   Eye  And cheek swelling   Persantine [dipyridamole] Other (See Comments)   unknown      Medication List    STOP taking these medications   amoxicillin 500 MG capsule Commonly known as:  AMOXIL     TAKE these medications   apixaban 2.5 MG Tabs tablet Commonly known as:  ELIQUIS Take 1 tablet (2.5 mg total) by mouth 2 (two) times daily.   carvedilol 6.25 MG tablet Commonly known as:  COREG Take 1 tablet (6.25 mg total) by mouth 2 (two) times daily with a meal.   losartan 25 MG tablet Commonly known as:  COZAAR Take 1 tablet (25 mg total) by mouth daily.   nitroGLYCERIN 0.4 MG SL tablet Commonly known as:  NITROSTAT Place 1 tablet (0.4 mg total) under the tongue every 5 (five) minutes as needed for chest pain.   polyethylene glycol packet Commonly known as:  MIRALAX / GLYCOLAX Take 17 g by mouth 2 (two) times daily.   Potassium Chloride ER 20 MEQ Tbcr Take 20 mEq by mouth daily.   senna-docusate 8.6-50 MG tablet Commonly known as:  Senokot-S Take 1 tablet by mouth at bedtime.      Follow-up Information    Haley Morning, DO. Schedule an appointment as soon as possible for a visit in 1 week(s).   Specialty:  Family Medicine Why:  Hospital follow-up for constipation Contact information: [address] [place] [address] [address] [phone]          Allergies  Allergen Reactions  . Chlorthalidone Swelling  . Lisinopril Swelling    Eye  And cheek swelling  . Persantine [Dipyridamole] Other (See Comments)    unknown    Consultations:  None   Procedures/Studies: Ct Abdomen Pelvis W Contrast  Result Date: 04/25/2018 CLINICAL DATA:  85 y/o  F; tenderness to the abdomen, constipation, hemorrhoids. EXAM: CT ABDOMEN AND PELVIS WITH CONTRAST TECHNIQUE: Multidetector CT imaging of the abdomen and pelvis was performed using the standard protocol following bolus administration of intravenous contrast. CONTRAST:  160mL OMNIPAQUE IOHEXOL 300 MG/ML  SOLN COMPARISON:  10/09/2017 lumbar spine radiographs. FINDINGS: Lower chest: Small bilateral pleural effusions. Minor dependent atelectasis of the lower lobes. Moderate to severe cardiomegaly. Mild interlobular  septal thickening at the lung bases. Hepatobiliary: No focal liver abnormality is seen. Mild periportal edema, probably related to congestive heart failure. Cholelithiasis, no gallbladder wall thickening, and no biliary dilatation. Pancreas: Unremarkable. No pancreatic ductal dilatation or surrounding inflammatory changes. Spleen: Normal in size without focal abnormality. Adrenals/Urinary Tract: Adrenal glands are unremarkable. Right kidney interpolar 18 mm simple cyst. Additional tiny cortical lucencies in the kidneys bilaterally likely representing small cysts. Otherwise kidneys are normal, without renal calculi, focal lesion, or hydronephrosis. Bladder is distended. Stomach/Bowel: Large volume of stool in the rectum with wall thickening and perirectal edema. No additional obstructive or inflammatory changes of the bowel. Sigmoid diverticulosis without findings of acute diverticulitis. Appendix not identified, no pericecal inflammation. Vascular/Lymphatic: Aortic atherosclerosis. No enlarged abdominal or pelvic lymph nodes. Reproductive: Uterus and bilateral adnexa are unremarkable. Other: No abdominal wall hernia or abnormality. No abdominopelvic ascites. Musculoskeletal: Stable moderate T12, mild L1, mild L2, mild L3, and mild L5 compression deformities. Interval mild L4 compression deformity, age indeterminate. Post augmentation of T12 and L1. IMPRESSION: 1. Large volume of stool in the rectum with wall thickening and perirectal edema may represent stercoral colitis. 2. Small bilateral pleural effusions. Mild interstitial edema. Moderate to severe cardiomegaly. 3. Cholelithiasis. 4. Sigmoid diverticulosis without findings of acute diverticulitis. 5. Interval mild L4 compression deformity, age indeterminate. 6. Aortic Atherosclerosis (ICD10-I70.0). Electronically Signed   By: Kristine Garbe M.D.   On: 04/25/2018 22:20   Dg Abd Portable 1v  Result Date: 04/26/2018 CLINICAL DATA:  Constipation Pt was  able to lay supine for imaging She was unsure about how long it has been since she went poop. She stated her daughter said two weeks but her son in law said 3-4 days EXAM: PORTABLE ABDOMEN - 1 VIEW COMPARISON:  CT of the abdomen and pelvis on 04/25/2018 FINDINGS: There has been significant improvement in the appearance of impaction of the rectosigmoid colon. Moderate stool burden identified within the transverse and descending colon. There is no free intraperitoneal air. Status post vertebral augmentation of T12 and L1. There is residual contrast in the urinary bladder following CT yesterday. IMPRESSION: Significant improvement in the appearance of impaction of the rectosigmoid colon. Electronically Signed   By: Nolon Nations M.D.   On: 04/26/2018 08:37      Subjective: Improvement of symptoms. Rectal pain improved.  Discharge Exam: Vitals:   04/26/18 0117 04/26/18 0516  BP: 115/68 111/63  Pulse: 90 88  Resp: 17 16  Temp: 97.8 F (36.6 C) (!) 97.5 F (36.4 C)  SpO2: 99% 98%   Vitals:   04/25/18 2330 04/26/18 0000 04/26/18 0117 04/26/18 0516  BP: 128/79 100/71 115/68 111/63  Pulse: 93 89 90 88  Resp: (!) 22 19 17 16   Temp:   97.8 F (36.6 C) (!) 97.5 F (36.4 C)  TempSrc:   Oral Oral  SpO2: 97% 96% 99% 98%  Weight:      Height:        General: Pt is alert, awake, not in acute distress Cardiovascular: RRR, S1/S2 +, no rubs, no gallops Respiratory: CTA bilaterally,  no wheezing, no rhonchi Abdominal: Soft, NT, ND, bowel sounds + Extremities: no edema, no cyanosis    The results of significant diagnostics from this hospitalization (including imaging, microbiology, ancillary and laboratory) are listed below for reference.     Microbiology: No results found for this or any previous visit (from the past 240 hour(s)).   Labs: BNP (last 3 results) Recent Labs    11/21/17 0025 12/09/17 1120 03/21/18 0828  BNP 2,052.4* 931.1* 6,767.2*   Basic Metabolic Panel: Recent  Labs  Lab 04/25/18 2000 04/26/18 0500  NA 135 135  K 3.5 3.4*  CL 98 101  CO2 23 22  GLUCOSE 125* 102*  BUN 22 21  CREATININE 0.85 0.75  CALCIUM 8.5* 8.0*   Liver Function Tests: Recent Labs  Lab 04/25/18 2000  AST 21  ALT 20  ALKPHOS 62  BILITOT 2.3*  PROT 6.0*  ALBUMIN 3.2*   Recent Labs  Lab 04/25/18 2000  LIPASE 25   No results for input(s): AMMONIA in the last 168 hours. CBC: Recent Labs  Lab 04/25/18 2000 04/26/18 0500  WBC 10.1 9.2  HGB 11.9* 10.6*  HCT 36.0 31.6*  MCV 96.8 96.0  PLT 248 207   Cardiac Enzymes: No results for input(s): CKTOTAL, CKMB, CKMBINDEX, TROPONINI in the last 168 hours. BNP: Invalid input(s): POCBNP CBG: No results for input(s): GLUCAP in the last 168 hours. D-Dimer No results for input(s): DDIMER in the last 72 hours. Hgb A1c No results for input(s): HGBA1C in the last 72 hours. Lipid Profile No results for input(s): CHOL, HDL, LDLCALC, TRIG, CHOLHDL, LDLDIRECT in the last 72 hours. Thyroid function studies No results for input(s): TSH, T4TOTAL, T3FREE, THYROIDAB in the last 72 hours.  Invalid input(s): FREET3 Anemia work up No results for input(s): VITAMINB12, FOLATE, FERRITIN, TIBC, IRON, RETICCTPCT in the last 72 hours. Urinalysis    Component Value Date/Time   COLORURINE AMBER (A) 04/26/2018 0154   APPEARANCEUR CLEAR 04/26/2018 0154   LABSPEC 1.023 04/26/2018 0154   PHURINE 5.0 04/26/2018 0154   GLUCOSEU NEGATIVE 04/26/2018 0154   HGBUR NEGATIVE 04/26/2018 0154   BILIRUBINUR NEGATIVE 04/26/2018 0154   KETONESUR NEGATIVE 04/26/2018 0154   PROTEINUR NEGATIVE 04/26/2018 0154   NITRITE NEGATIVE 04/26/2018 0154   LEUKOCYTESUR NEGATIVE 04/26/2018 0154    SIGNED:   Cordelia Poche, MD [facility] 04/26/2018, 12:45 PM

## 2018-04-26 NOTE — Progress Notes (Signed)
Tap water enema given. Patient tolerated well. Medium amount of stool output

## 2018-04-26 NOTE — Progress Notes (Signed)
Patient arrived to unit via ED. Transferred patient from bed to bed via slide plus three assist personnel. Patient bladder distended. Bladder scan resulted in >1000. Placed call to [facility] for straight cath order

## 2018-04-26 NOTE — ED Notes (Signed)
Report given to rn on 6n      

## 2018-04-26 NOTE — Plan of Care (Signed)

## 2018-04-28 DIAGNOSIS — Z7901 Long term (current) use of anticoagulants: Secondary | ICD-10-CM | POA: Diagnosis not present

## 2018-04-28 DIAGNOSIS — I509 Heart failure, unspecified: Secondary | ICD-10-CM | POA: Diagnosis not present

## 2018-04-28 DIAGNOSIS — I110 Hypertensive heart disease with heart failure: Secondary | ICD-10-CM | POA: Diagnosis not present

## 2018-04-28 DIAGNOSIS — I480 Paroxysmal atrial fibrillation: Secondary | ICD-10-CM | POA: Diagnosis not present

## 2018-04-28 DIAGNOSIS — I69344 Monoplegia of lower limb following cerebral infarction affecting left non-dominant side: Secondary | ICD-10-CM | POA: Diagnosis not present

## 2018-04-29 ENCOUNTER — Telehealth: Payer: Self-pay | Admitting: Cardiology

## 2018-04-29 NOTE — Telephone Encounter (Signed)
Would continue carvedilol and follow pulse and blood pressure. Kirk Ruths, MD

## 2018-04-29 NOTE — Telephone Encounter (Signed)
Spoke with pt granddaughter, Aware of dr Jacalyn Lefevre recommendations.

## 2018-04-29 NOTE — Telephone Encounter (Signed)
New Message          Patient was in the ED again and she was told to stop "Carvedilol" and Lasix and she is checking to see if this ok or not? Pls call to advise

## 2018-04-29 NOTE — Telephone Encounter (Signed)
Returned call to pt PCP he wanted to stop carvedilol and she is wondering if she should stop? BP is low and she is not eating. She did not want to schedule an appt to discuss. She is asking if the CATH would help her with her "leaky valve" and her quality of life. She states that she has been not feeling well off and on since December. Should she stop the carvedilol? Please advise

## 2018-05-01 DIAGNOSIS — I509 Heart failure, unspecified: Secondary | ICD-10-CM | POA: Diagnosis not present

## 2018-05-01 DIAGNOSIS — I480 Paroxysmal atrial fibrillation: Secondary | ICD-10-CM | POA: Diagnosis not present

## 2018-05-01 DIAGNOSIS — I110 Hypertensive heart disease with heart failure: Secondary | ICD-10-CM | POA: Diagnosis not present

## 2018-05-01 DIAGNOSIS — I69344 Monoplegia of lower limb following cerebral infarction affecting left non-dominant side: Secondary | ICD-10-CM | POA: Diagnosis not present

## 2018-05-01 DIAGNOSIS — Z7901 Long term (current) use of anticoagulants: Secondary | ICD-10-CM | POA: Diagnosis not present

## 2018-05-01 MED ORDER — CARVEDILOL 6.25 MG PO TABS: 3.1250 mg | ORAL_TABLET | Freq: Two times a day (BID) | ORAL | 3 refills | Status: DC

## 2018-05-01 NOTE — Telephone Encounter (Signed)
° ° °  Patients granddaughter calling to report home health nurse is at the home, BP 80/45, has been advised to go to ED, however patient declines to go. Requesting call from nurse

## 2018-05-01 NOTE — Addendum Note (Signed)
Addended by: Cristopher Estimable on: 05/01/2018 03:24 PM   Modules accepted: Orders

## 2018-05-01 NOTE — Telephone Encounter (Signed)
Spoke with pt granddaughter, the patient is feeling weak but otherwise is fine. She will not go to the ER, she is still having a lot of diarrhea. Advised granddaughter to cut the carvedilol to 3.125 mg twice daily. They are giving her Gatorade. They will call back if bp continues to run low after decreasing the dose.

## 2018-05-05 ENCOUNTER — Other Ambulatory Visit: Payer: Self-pay | Admitting: Cardiology

## 2018-05-05 DIAGNOSIS — Z7901 Long term (current) use of anticoagulants: Secondary | ICD-10-CM | POA: Diagnosis not present

## 2018-05-05 DIAGNOSIS — I110 Hypertensive heart disease with heart failure: Secondary | ICD-10-CM | POA: Diagnosis not present

## 2018-05-05 DIAGNOSIS — I480 Paroxysmal atrial fibrillation: Secondary | ICD-10-CM | POA: Diagnosis not present

## 2018-05-05 DIAGNOSIS — I69344 Monoplegia of lower limb following cerebral infarction affecting left non-dominant side: Secondary | ICD-10-CM | POA: Diagnosis not present

## 2018-05-05 DIAGNOSIS — I509 Heart failure, unspecified: Secondary | ICD-10-CM | POA: Diagnosis not present

## 2018-05-05 MED ORDER — APIXABAN 2.5 MG PO TABS: 2.5000 mg | ORAL_TABLET | Freq: Two times a day (BID) | ORAL | 1 refills | Status: DC

## 2018-05-05 NOTE — Telephone Encounter (Signed)
. °*  STAT* If patient is at the pharmacy, call can be transferred to refill team.   1. Which medications need to be refilled? (please list name of each medication and dose if known) Eliquis  2. Which pharmacy/location (including street and city if local pharmacy) is medication to be sent to? [facility] 3. Do they need a 30 day or 90 day supply? 180 and refills

## 2018-05-07 DIAGNOSIS — I509 Heart failure, unspecified: Secondary | ICD-10-CM | POA: Diagnosis not present

## 2018-05-07 DIAGNOSIS — I480 Paroxysmal atrial fibrillation: Secondary | ICD-10-CM | POA: Diagnosis not present

## 2018-05-07 DIAGNOSIS — I69344 Monoplegia of lower limb following cerebral infarction affecting left non-dominant side: Secondary | ICD-10-CM | POA: Diagnosis not present

## 2018-05-07 DIAGNOSIS — Z7901 Long term (current) use of anticoagulants: Secondary | ICD-10-CM | POA: Diagnosis not present

## 2018-05-07 DIAGNOSIS — I110 Hypertensive heart disease with heart failure: Secondary | ICD-10-CM | POA: Diagnosis not present

## 2018-05-11 ENCOUNTER — Other Ambulatory Visit: Payer: Self-pay

## 2018-05-11 MED ORDER — APIXABAN 2.5 MG PO TABS: 2.5000 mg | ORAL_TABLET | Freq: Two times a day (BID) | ORAL | 1 refills | Status: DC

## 2018-05-13 ENCOUNTER — Other Ambulatory Visit: Payer: Self-pay | Admitting: Cardiology

## 2018-05-13 DIAGNOSIS — I110 Hypertensive heart disease with heart failure: Secondary | ICD-10-CM | POA: Diagnosis not present

## 2018-05-13 DIAGNOSIS — I480 Paroxysmal atrial fibrillation: Secondary | ICD-10-CM | POA: Diagnosis not present

## 2018-05-13 DIAGNOSIS — I509 Heart failure, unspecified: Secondary | ICD-10-CM | POA: Diagnosis not present

## 2018-05-13 DIAGNOSIS — Z7901 Long term (current) use of anticoagulants: Secondary | ICD-10-CM | POA: Diagnosis not present

## 2018-05-13 DIAGNOSIS — I69344 Monoplegia of lower limb following cerebral infarction affecting left non-dominant side: Secondary | ICD-10-CM | POA: Diagnosis not present

## 2018-05-13 MED ORDER — NITROGLYCERIN 0.4 MG SL SUBL: 0.4000 mg | SUBLINGUAL_TABLET | SUBLINGUAL | 1 refills | Status: DC | PRN

## 2018-05-13 MED ORDER — CARVEDILOL 6.25 MG PO TABS: 3.1250 mg | ORAL_TABLET | Freq: Two times a day (BID) | ORAL | 2 refills | Status: DC

## 2018-05-13 MED ORDER — CARVEDILOL 6.25 MG PO TABS: 3.1250 mg | ORAL_TABLET | Freq: Two times a day (BID) | ORAL | 1 refills | Status: AC

## 2018-05-13 NOTE — Telephone Encounter (Signed)
New Message    *STAT* If patient is at the pharmacy, call can be transferred to refill team.   1. Which medications need to be refilled? (please list name of each medication and dose if known) Carvedilol  6.25mg   2. Which pharmacy/location (including street and city if local pharmacy) is medication to be sent to? [facility] on [address]  3. Do they need a 30 day or 90 day supply? 90 day

## 2018-05-21 DIAGNOSIS — Z7901 Long term (current) use of anticoagulants: Secondary | ICD-10-CM | POA: Diagnosis not present

## 2018-05-21 DIAGNOSIS — I480 Paroxysmal atrial fibrillation: Secondary | ICD-10-CM | POA: Diagnosis not present

## 2018-05-21 DIAGNOSIS — I509 Heart failure, unspecified: Secondary | ICD-10-CM | POA: Diagnosis not present

## 2018-05-21 DIAGNOSIS — I110 Hypertensive heart disease with heart failure: Secondary | ICD-10-CM | POA: Diagnosis not present

## 2018-05-21 DIAGNOSIS — I69344 Monoplegia of lower limb following cerebral infarction affecting left non-dominant side: Secondary | ICD-10-CM | POA: Diagnosis not present

## 2018-05-22 ENCOUNTER — Other Ambulatory Visit: Payer: Self-pay | Admitting: Pharmacist Clinician (PhC)/ Clinical Pharmacy Specialist

## 2018-05-22 MED ORDER — APIXABAN 2.5 MG PO TABS: 2.5000 mg | ORAL_TABLET | Freq: Two times a day (BID) | ORAL | 1 refills | Status: DC

## 2018-05-24 DIAGNOSIS — Z7901 Long term (current) use of anticoagulants: Secondary | ICD-10-CM | POA: Diagnosis not present

## 2018-05-24 DIAGNOSIS — I69344 Monoplegia of lower limb following cerebral infarction affecting left non-dominant side: Secondary | ICD-10-CM | POA: Diagnosis not present

## 2018-05-24 DIAGNOSIS — I509 Heart failure, unspecified: Secondary | ICD-10-CM | POA: Diagnosis not present

## 2018-05-24 DIAGNOSIS — I480 Paroxysmal atrial fibrillation: Secondary | ICD-10-CM | POA: Diagnosis not present

## 2018-05-24 DIAGNOSIS — I110 Hypertensive heart disease with heart failure: Secondary | ICD-10-CM | POA: Diagnosis not present

## 2018-05-25 DIAGNOSIS — Z7901 Long term (current) use of anticoagulants: Secondary | ICD-10-CM | POA: Diagnosis not present

## 2018-05-25 DIAGNOSIS — I110 Hypertensive heart disease with heart failure: Secondary | ICD-10-CM | POA: Diagnosis not present

## 2018-05-25 DIAGNOSIS — I69344 Monoplegia of lower limb following cerebral infarction affecting left non-dominant side: Secondary | ICD-10-CM | POA: Diagnosis not present

## 2018-05-25 DIAGNOSIS — I480 Paroxysmal atrial fibrillation: Secondary | ICD-10-CM | POA: Diagnosis not present

## 2018-05-25 DIAGNOSIS — I509 Heart failure, unspecified: Secondary | ICD-10-CM | POA: Diagnosis not present

## 2018-05-26 ENCOUNTER — Other Ambulatory Visit: Payer: Self-pay

## 2018-05-26 NOTE — Patient Outreach (Signed)
Telephone outreach to patient to obtain mRs was successfully completed. mRs= 2. 

## 2018-05-28 ENCOUNTER — Ambulatory Visit: Payer: Medicare Other | Admitting: Cardiology

## 2018-06-01 ENCOUNTER — Other Ambulatory Visit: Payer: Self-pay

## 2018-06-01 ENCOUNTER — Other Ambulatory Visit: Payer: Medicare Other | Admitting: Internal Medicine

## 2018-06-01 DIAGNOSIS — Z515 Encounter for palliative care: Secondary | ICD-10-CM | POA: Diagnosis not present

## 2018-06-02 ENCOUNTER — Telehealth (HOSPITAL_COMMUNITY): Payer: Self-pay | Admitting: Radiology

## 2018-06-02 NOTE — Telephone Encounter (Signed)
Called pt, left VM for her to call and let us know her decision about treatment vs. Follow-up. JM

## 2018-06-03 NOTE — Progress Notes (Signed)
    [facility] Consult Note Telephone: [phone]  Fax: [phone]  PATIENT NAME: Haley Costa DOB: 19-Oct-1928 MRN: [id number]  PRIMARY CARE PROVIDER:   [name], DO  REFERRING PROVIDER:  [name], DO [address] [place], [place] [address]  RESPONSIBLE PARTY:   Self and Coralie Keens (grand daughter/HCPOA) [phone]      RECOMMENDATIONS and PLAN:  Palliative Care Encounter  Z51.5  1. Fatigue:  Chronic and related to advanced CHF, and recent CVA. Minimal reserve. New baseline.  Increase rest breaks. Paramedicine visits would be beneficial for close FU. Family will assist prn. Monitor for additional decline.  2.  Edema:  Related to CHF. Poor diet control.  Low sodium meal choices rather than soups, frozen meals, compression socks and leg elevation, Furosemide qod as prescribed, daily wts and report as instructed.S&S of exacerbation reviewed.  3.  Advanced Care Planning:  Reviewed Palliative vs Hospice Care to pt and grand daughter.  Pt. Goals are to regain strength to remain independent in her home.  She will continue to require assistance from son and grand daughter.   Advanced directives/MOST form reviewed.  Code status is DNR. She and grand daughter would like to review remainder of MOST and prepare for completion during next visit in aprox 3 weeks.  I spent 60 minutes providing this consultation,  from 1300 to 1400 at pt's home. More than 50% of the time in this consultation was spent coordinating communication with pt., grand daughter and nurse navigator [employer].   HISTORY OF PRESENT ILLNESS:  Haley Costa is a 85 y.o. year old female with multiple medical problems including chronic CHF( EF=20% on 04/20/18,  a decline from 03/01/18 and 11/21/17 when EF=25-30%),  Afib and CVA. She was hospitalized from 12/14-12/17/19 due to an acute CVA.  She does have some residual L hemiparesis with instability with  ambulation.  Home health services have ended.  Pt reports significant fatigue with functional decline requiring assistance with daily ADLs and home duties.  Palliative Care was asked to help address goals of care.   CODE STATUS: DNR  PPS: 40% weak HOSPICE ELIGIBILITY/DIAGNOSIS: TBD  PAST MEDICAL HISTORY:  Past Medical History:  Diagnosis Date  . Breast cancer, left breast ([place]) 1992   S/P mastectomy & chemo  . CHF (congestive heart failure) ([place])   . Chronic back pain    "lower and middle back; I've had several broken vertebrae" (12/09/2017)  . CVA (cerebral vascular accident) ([place]) 02/2018  . Heart murmur   . High cholesterol   . History of kidney stones   . Hypertension   . LBBB (left bundle branch block)    Patient states that she was told by her PCP that she has left bundle blockage  . Osteoarthritis    "some of the joints" (12/09/2017)  . Pneumonia    "when I was a child" (12/09/2017)  . PONV (postoperative nausea and vomiting)      PHYSICAL EXAM:   General: NAD, frail appearing, thin Cardiovascular: regular rate and irregularrhythm Pulmonary: clear lung fields. Slight Shob Abdomen: soft, nontender, + bowel sounds Extremities: 2+ edema to mid thigh,  Skin: no rashes Neurological: A&O x3.  Mild L hemiparesis  Gonzella Lex, NP

## 2018-06-04 DIAGNOSIS — R609 Edema, unspecified: Secondary | ICD-10-CM | POA: Diagnosis not present

## 2018-06-04 DIAGNOSIS — R52 Pain, unspecified: Secondary | ICD-10-CM | POA: Diagnosis not present

## 2018-06-04 DIAGNOSIS — R079 Chest pain, unspecified: Secondary | ICD-10-CM | POA: Diagnosis not present

## 2018-06-04 DIAGNOSIS — I447 Left bundle-branch block, unspecified: Secondary | ICD-10-CM | POA: Diagnosis not present

## 2018-06-08 DIAGNOSIS — Z515 Encounter for palliative care: Secondary | ICD-10-CM | POA: Insufficient documentation

## 2018-06-22 ENCOUNTER — Other Ambulatory Visit: Payer: Self-pay

## 2018-06-22 ENCOUNTER — Other Ambulatory Visit: Payer: Medicare Other | Admitting: Internal Medicine

## 2018-06-22 DIAGNOSIS — Z515 Encounter for palliative care: Secondary | ICD-10-CM | POA: Diagnosis not present

## 2018-06-22 NOTE — Progress Notes (Signed)
[facility] Consult Note Telephone: [phone]  Fax: [phone]  PATIENT NAME: Haley Costa DOB: [birth year] MRN: [id number]  PRIMARY CARE PROVIDER:   [name], DO  REFERRING PROVIDER:  [name], DO [address] [place], [place] [address]  RESPONSIBLE PARTY:   Self and Haley Costa (grand daughter/HCPOA) [phone]      RECOMMENDATIONS and PLAN:  Palliative Care Encounter  Z51.5  1. Fatigue:  Chronic and related to advanced CHF, and recent CVA.  New baseline.  Increase rest breaks. Paramedicine visits are unavailable. Continues to receive personal assistance from immediate family.  2.  Edema:  Ongoing. Related to CHF. S&S of exacerbation and daily maintenance inforced again. Continue to wear compression hose during awake hours along with leg elevation. Support given on switching veggies to no salt frozen instead of canned.  Re-start weighing daily and record. Pt. Will continue to alternate daily Lasix dose of 20mg  and 40mg .    3.  Advanced Care Planning:  Code status remains DNAR. Signed document is in the home.  Readdressed MOST document and pt was not able to make decisions today.  She would like to exhaust all treatments available at home for now.  Pt. goals are to regain strength to remain independent in her home with necessary assistance from her son and grand daughter. Palliative RN will reach out to patient weekly via phone call during this emergency crisis.  I will f/u with pt and grand daughter via Telehealth or in person in aprox 1 month.  Encouragement given.   . Due to the COVID-19 crisis, this telephone evaluation and treatment contact was done via telephone from my office and it was initiated and consented by this patient and family. I spent 20 minutes providing this consultation via ,  from 1300 to 1320 via telephone. More than 50% of the time in this consultation was spent coordinating  communication with pt., grand daughter.   HISTORY OF PRESENT ILLNESS: F/U with [name], an 85 y.o. year old female with multiple medical problems including chronic CHF( EF=20% on 04/20/18,  a decline from 03/01/18 and 11/21/17 when EF=25-30%),  Afib and CVA. Grand daughter Haley Costa states that patient has begun eating unsalted frozen vegetables and has been wearing her compression hose daily which normally improves the edema of lower extremities.  Pt reports intermittent nausea without vomiting or fever over last 4 days.  PRN use of Zofran improves symptoms. She still gets fatigued with exertion and requires resting.  Last reported weight was 138 from 06/06/18 which varies +/- 3-4 lbs.  [place] daughter reports some ankle edema today.  Pt has not taken daily dose of Lasix thus far.  CODE STATUS: DANR  PPS: 40% weak HOSPICE ELIGIBILITY/DIAGNOSIS: TBD  PAST MEDICAL HISTORY:  Past Medical History:  Diagnosis Date   Breast cancer, left breast ([place]) 1992   S/P mastectomy & chemo   CHF (congestive heart failure) ([facility])    Chronic back pain    "lower and middle back; I've had several broken vertebrae" (12/09/2017)   CVA (cerebral vascular accident) ([place]) 02/2018   Heart murmur    High cholesterol    History of kidney stones    Hypertension    LBBB (left bundle branch block)    Patient states that she was told by her PCP that she has left bundle blockage   Osteoarthritis    "some of the joints" (12/09/2017)   Pneumonia    "when I  was a child" (12/09/2017)   PONV (postoperative nausea and vomiting)      PHYSICAL EXAM:   BP  107/73   P 72   Reported by grand daughter  General: Unable to visualize patient Pulmonary: Slightly short of breath with speech.  No audible wheezing Extremities: Reports of edema.  Unable to visualize  Neurological: A&O x3.  Appropriate communication  Gonzella Lex, NP-C

## 2018-06-23 ENCOUNTER — Telehealth: Payer: Self-pay | Admitting: Internal Medicine

## 2018-06-23 NOTE — Telephone Encounter (Signed)
Phoned grand daughter Darden Amber and pt in reference to scheduled appointment for 06/22/18. Offered a Telehealth visit for today's appt instead of a in person visit related to the COVID-19 emergency. Mrs. Kozub agreed to a telephone evaluation appt since she does not have access to a computer or smart phone system for video communication. Appt. Will occur today at 1PM via telephone per pt's consent.                                                          Gonzella Lex, NP

## 2018-07-12 DIAGNOSIS — R079 Chest pain, unspecified: Secondary | ICD-10-CM | POA: Diagnosis not present

## 2018-07-12 DIAGNOSIS — R457 State of emotional shock and stress, unspecified: Secondary | ICD-10-CM | POA: Diagnosis not present

## 2018-07-12 DIAGNOSIS — R0602 Shortness of breath: Secondary | ICD-10-CM | POA: Diagnosis not present

## 2018-07-12 DIAGNOSIS — I499 Cardiac arrhythmia, unspecified: Secondary | ICD-10-CM | POA: Diagnosis not present

## 2018-07-15 ENCOUNTER — Telehealth: Payer: Self-pay | Admitting: Cardiology

## 2018-07-15 ENCOUNTER — Telehealth: Payer: Self-pay

## 2018-07-15 ENCOUNTER — Other Ambulatory Visit: Payer: Medicare Other | Admitting: Internal Medicine

## 2018-07-15 ENCOUNTER — Other Ambulatory Visit: Payer: Self-pay

## 2018-07-15 DIAGNOSIS — Z515 Encounter for palliative care: Secondary | ICD-10-CM | POA: Diagnosis not present

## 2018-07-15 NOTE — Telephone Encounter (Signed)
Received call from Palliative care NP Enid Derry who saw patient today via tele-health video visit who reports that since 06/06/2018, patient has c/o swelling in legs and was given compression stockings to treat this. Reports EMS being called to home this past Sunday for c/o heaviness in chest, increased nausea, sob and edema up to thighs. Patient was not taken to hospital since EKG was unchanged. Weight on 03/21 was 138 lbs and weight today 146 lbs. Currently taken lasix 20 mg alternating 40 mg daily prescribed by PCP. Since this past Friday, patient took 20 mg lasix on 04/21 no lasix on 04/25, Sunday through Tuesday, took 40 mg lasix and 20 mg today. Sill c/o sob at rest and has edema up to mid thigh per Enid Derry NP. Per granddaughter, can not hear wheezing at this time. Per NP, not having heaviness in chest but still has nausea. Advised that patient would be contacted to be scheduled for a virtual visit.

## 2018-07-15 NOTE — Telephone Encounter (Signed)
Patient offered VV at 8:00 am on 04/30 but declined. Advised that message would be sent to her provider for a time and day and if symptoms got worse, to go to the ED for an evaluation. Verbalized understanding.

## 2018-07-15 NOTE — Telephone Encounter (Signed)
New Message:  NP calling concerning patient adermia all over her legs. Please call Enid Derry NP at [phone]

## 2018-07-15 NOTE — Telephone Encounter (Signed)
Spoke with patient's granddaughter, [name], to check in on patient. [name] shared that patient is having more nausea that is requiring more frequent zofran. Patient has also been more short of breath with edema in the upper part of her legs. Visit scheduled with Palliative NP for today @ 4pm

## 2018-07-15 NOTE — Telephone Encounter (Signed)
Phone call placed to patient to inquire if she had weighed herself today. VM left with call back information

## 2018-07-16 ENCOUNTER — Other Ambulatory Visit: Payer: Self-pay

## 2018-07-16 ENCOUNTER — Telehealth (INDEPENDENT_AMBULATORY_CARE_PROVIDER_SITE_OTHER): Payer: Medicare Other | Admitting: Cardiology

## 2018-07-16 ENCOUNTER — Encounter: Payer: Self-pay | Admitting: Cardiology

## 2018-07-16 ENCOUNTER — Other Ambulatory Visit: Payer: Self-pay | Admitting: *Deleted

## 2018-07-16 VITALS — BP 104/67 | HR 70 | Ht 61.0 in | Wt 146.0 lb

## 2018-07-16 DIAGNOSIS — I5022 Chronic systolic (congestive) heart failure: Secondary | ICD-10-CM | POA: Diagnosis not present

## 2018-07-16 DIAGNOSIS — I420 Dilated cardiomyopathy: Secondary | ICD-10-CM

## 2018-07-16 DIAGNOSIS — I10 Essential (primary) hypertension: Secondary | ICD-10-CM

## 2018-07-16 MED ORDER — APIXABAN 5 MG PO TABS: 5.0000 mg | ORAL_TABLET | Freq: Two times a day (BID) | ORAL | 3 refills | Status: DC

## 2018-07-16 MED ORDER — SPIRONOLACTONE 25 MG PO TABS: 25.0000 mg | ORAL_TABLET | Freq: Every day | ORAL | 3 refills | Status: DC

## 2018-07-16 NOTE — Patient Instructions (Signed)
Medication Instructions:  INCREASE FUROSEMIDE TOTWICE DAILY 40 MG TWICE DAILY FOR TODAY AND TOMORROW THEN DECREASE TO 40 MG ONCE DAILY  START SPIRONOLACTONE 25 MG ONCE DAILY  INCREASE ELIQUIS TO 5 MG TWICE DAILY= 2 OF THE 2.5 MG TABLETS  If you need a refill on your cardiac medications before your next appointment, please call your pharmacy.   Lab work: Your physician recommends that you return for lab work in: [place] If you have labs (blood work) drawn today and your tests are completely normal, you will receive your results only by: Marland Kitchen MyChart Message (if you have MyChart) OR . A paper copy in the mail If you have any lab test that is abnormal or we need to change your treatment, we will call you to review the results.  Follow-Up: At [facility], you and your health needs are our priority.  As part of our continuing mission to provide you with exceptional heart care, we have created designated Provider Care Teams.  These Care Teams include your primary Cardiologist (physician) and Advanced Practice Providers (APPs -  Physician Assistants and Nurse Practitioners) who all work together to provide you with the care you need, when you need it. Your physician recommends that you schedule a follow-up appointment in: [place]

## 2018-07-16 NOTE — Telephone Encounter (Signed)
Patient informed. 

## 2018-07-16 NOTE — Progress Notes (Signed)
Virtual Visit via Video    This visit type was conducted due to national recommendations for restrictions regarding the COVID-19 Pandemic (e.g. social distancing) in an effort to limit this patient's exposure and mitigate transmission in our community.  Due to her co-morbid illnesses, this patient is at least at moderate risk for complications without adequate follow up.  This format is felt to be most appropriate for this patient at this time.  All issues noted in this document were discussed and addressed.  A limited physical exam was performed with this format.  Please refer to the patient's chart for her consent to telehealth for [facility].   Evaluation Performed:  Follow-up visit  Date:  07/16/2018   ID:  Shacoria, Latif 03/14/1929, MRN [id number]  Patient Location: Home Provider Location: Home  PCP:  [name], DO  Cardiologist:  Dr Stanford Breed  Chief Complaint:  FU CHF  History of Present Illness:    Follow-up congestive heart failure.  Patient admitted with chest pain and CHF in September 2019.  Echocardiogram showed ejection fraction 25%.  Cardiac catheterization was discussed with patient but she declined and preferred medical therapy.  She was placed on Entresto and spironolactone but developed hypotension resulting in syncope.  Had CVA December 2019.  Patient had thrombectomy of right M1 occlusion.  She was found to have atrial fibrillation on telemetry and Eliquis was initiated.   Echocardiogram repeated February 2020 and showed ejection fraction 20%.  Right ventricular function was moderately reduced and there was moderate pulmonary hypertension.  There was severe biatrial enlargement, severe mitral regurgitation and moderate tricuspid regurgitation.  Since last seen patient describes occasional dyspnea on exertion as well as PND.  She has had increased lower extremity edema bilaterally.  She has gained approximately 8 pounds over the past 3 weeks.  No chest pain or  syncope.  The patient does not have symptoms concerning for COVID-19 infection (fever, chills, cough, or new shortness of breath).    Past Medical History:  Diagnosis Date   Breast cancer, left breast ([place]) 1992   S/P mastectomy & chemo   CHF (congestive heart failure) (HCC)    Chronic back pain    "lower and middle back; I've had several broken vertebrae" (12/09/2017)   CVA (cerebral vascular accident) ([place]) 02/2018   Heart murmur    High cholesterol    History of kidney stones    Hypertension    LBBB (left bundle branch block)    Patient states that she was told by her PCP that she has left bundle blockage   Osteoarthritis    "some of the joints" (12/09/2017)   Pneumonia    "when I was a child" (12/09/2017)   PONV (postoperative nausea and vomiting)    Past Surgical History:  Procedure Laterality Date   APPENDECTOMY     BACK SURGERY     CATARACT EXTRACTION W/ INTRAOCULAR LENS IMPLANT Right    FIXATION KYPHOPLASTY THORACIC SPINE  2014   IR CT HEAD LTD  02/28/2018   IR PERCUTANEOUS ART THROMBECTOMY/INFUSION INTRACRANIAL INC DIAG ANGIO  02/28/2018   MASTECTOMY Left 1992   RADIOLOGY WITH ANESTHESIA N/A 02/28/2018   Procedure: RADIOLOGY WITH ANESTHESIA;  Surgeon: [name], MD;  Location: [facility] [place];  Service: Radiology;  Laterality: N/A;     Current Meds  Medication Sig   apixaban (ELIQUIS) 2.5 MG TABS tablet Take 1 tablet (2.5 mg total) by mouth 2 (two) times daily.   carvedilol (COREG) 6.25 MG tablet  Take 0.5 tablets (3.125 mg total) by mouth 2 (two) times daily with a meal.   furosemide (LASIX) 20 MG tablet Take 20 mg by mouth daily. Alternating with 40 mg   losartan (COZAAR) 25 MG tablet Take 1 tablet (25 mg total) by mouth daily.   nitroGLYCERIN (NITROSTAT) 0.4 MG SL tablet Place 1 tablet (0.4 mg total) under the tongue every 5 (five) minutes as needed for chest pain.   ondansetron (ZOFRAN) 4 MG tablet Take 1 tablet by mouth every 4 (four)  hours as needed.   polyethylene glycol (MIRALAX / GLYCOLAX) packet Take 17 g by mouth 2 (two) times daily.   potassium chloride 20 MEQ TBCR Take 20 mEq by mouth daily.   [DISCONTINUED] senna-docusate (SENOKOT-S) 8.6-50 MG tablet Take 1 tablet by mouth at bedtime.     Allergies:   Chlorthalidone; Lisinopril; and Persantine [dipyridamole]   Social History   Tobacco Use   Smoking status: Never Smoker   Smokeless tobacco: Never Used  Substance Use Topics   Alcohol use: Never    Frequency: Never   Drug use: Never     Family Hx: The patient's family history includes Cervical cancer in her mother; Hypertension in her brother.  ROS:   Please see the history of present illness.    Patient denies fevers, chills or productive cough.  Some generalized weakness All other systems reviewed and are negative.  Recent Labs: 12/09/2017: TSH 1.091 02/28/2018: Magnesium 2.0 03/21/2018: B Natriuretic Peptide 1,575.2 04/25/2018: ALT 20 04/26/2018: BUN 21; Creatinine, Ser 0.75; Hemoglobin 10.6; Platelets 207; Potassium 3.4; Sodium 135   Recent Lipid Panel Lab Results  Component Value Date/Time   CHOL 174 02/28/2018 03:03 AM   TRIG 57 02/28/2018 10:10 AM   HDL 60 02/28/2018 03:03 AM   CHOLHDL 2.9 02/28/2018 03:03 AM   LDLCALC 98 02/28/2018 03:03 AM    Wt Readings from Last 3 Encounters:  07/16/18 146 lb (66.2 kg)  04/25/18 126 lb (57.2 kg)  04/02/18 129 lb 12.8 oz (58.9 kg)     Objective:    Vital Signs:  BP 104/67    Pulse 70    Ht 5\' 1"  (1.549 m)    Wt 146 lb (66.2 kg)    BMI 27.59 kg/m    VITAL SIGNS:  reviewed  Patient answers questions appropriately. Normal affect No acute distress Remainder of physical examination not performed (telehealth visit; coronavirus pandemic)  ASSESSMENT & PLAN:    1. Acute on chronic systolic congestive heart failure-by history patient is volume overloaded.  I tried to see patient's legs via video but was difficult.  She has gained 8 pounds and  has bilateral lower extremity edema.  Increase Lasix to 40 mg twice daily today and tomorrow then resume Lasix 40 mg daily.  Add Spironolactone 25 mg daily.  Check potassium and renal function in 1 week. 2. Cardiomyopathy-patient declined cardiac catheterization previously and continues to only want conservative measures.  We have therefore treated medically.  We will continue with ARB and beta-blocker.  She did not tolerate Entresto because of hypotension.  I cannot advance medications as blood pressure has been borderline previously. 3. Paroxysmal atrial fibrillation-continue carvedilol for rate control if atrial fibrillation recurs.  Continue apixaban but weight is greater than 60 kg.  Increase to 5 mg twice daily. 4. Severe mitral regurgitation-patient has previously requested only conservative measures. 5. Hypertension-patient's blood pressure is borderline.  Continue present medications and follow. 6. Hyperlipidemia-managed by internal medicine.  COVID-19 Education: The importance  of social distancing was discussed today.  Time:   Today, I have spent 15 minutes with the patient with telehealth technology discussing the above problems.     Medication Adjustments/Labs and Tests Ordered: Current medicines are reviewed at length with the patient today.  Concerns regarding medicines are outlined above.   Tests Ordered: No orders of the defined types were placed in this encounter.   Medication Changes: No orders of the defined types were placed in this encounter.   Disposition:  Follow up in 3 month(s)  Signed, Kirk Ruths, MD  07/16/2018 1:53 PM    [facility]

## 2018-07-16 NOTE — Telephone Encounter (Signed)
Left message to give VV appt time for today with Dr. Stanford Breed at 2:00 pm.

## 2018-07-20 ENCOUNTER — Other Ambulatory Visit: Payer: Medicare Other | Admitting: Internal Medicine

## 2018-07-20 DIAGNOSIS — Z515 Encounter for palliative care: Secondary | ICD-10-CM

## 2018-07-20 NOTE — Progress Notes (Signed)
[facility] Consult Note Telephone: [phone]  Fax: [phone]  PATIENT NAME: Haley Costa DOB: 1928/05/25 MRN: [id number]  PRIMARY CARE PROVIDER:   [name], DO  REFERRING PROVIDER:  [name], DO [address] [place], [place] [address]  RESPONSIBLE PARTY:   Self and Coralie Keens (grand daughter/HCPOA) [phone]      RECOMMENDATIONS and PLAN:  Palliative Care Encounter  Z51.5  1. Fatigue:  Chronic and exacerbated by increased fluid retention currently. Hopeful for return to baseline after diuresis.  2.  Edema:  Increased.  Not well managed with current use of Lasix dosing. Contacted cardiology practice nurse and updated on pt. Status. She states they will contact patient for a virtual visit.     3.  Advanced Care Planning:  Code status remains DNAR. Signed document is in the home.  Readdressed MOST document and pt was not able to make decisions today.  She would like to exhaust all treatments available at home for now.  Pt. goals are to regain strength to remain independent in her home with necessary assistance from her son and grand daughter. Palliative RN will reach out to patient weekly via phone call during this emergency crisis.  I will f/u with pt and grand daughter via Telehealth or in person in aprox 1 month.  Encouragement given.   . Due to the COVID-19 crisis, this visit  was done via telehealth from my office and it was initiated and consented by this patient and family.  I spent 30 minutes providing this consultation via ,  from 1515 to 1545 via telephone. More than 50% of the time in this consultation was spent coordinating communication with pt., grand daughter.   HISTORY OF PRESENT ILLNESS: F/U with Haley Costa, an 85 y.o. year old female with multiple medical problems including chronic CHF( EF=20% on 04/20/18,  a decline from 03/01/18 and 11/21/17 when EF=25-30%)  Grand daughter [name]  and patient report increased shortness of breath and chest heaviness that required a visit by EMS on 07/12/18.  She did take NTG x2 for chest discomfort and denies feeling chest discomfort today.  She does report increase in nausea without vomiting.  She was not transported to the hospital.  She still gets fatigued with exertion and now at rest.  Today's weight is 146 and 1 month ago was 138.  She took Lasix 40mg  for last 2 days and 20mg  today.  CODE STATUS: DANR  PPS: 40% weak HOSPICE ELIGIBILITY/DIAGNOSIS: TBD  PAST MEDICAL HISTORY:  Past Medical History:  Diagnosis Date  . Breast cancer, left breast ([place]) 1992   S/P mastectomy & chemo  . CHF (congestive heart failure) ([place])   . Chronic back pain    "lower and middle back; I've had several broken vertebrae" (12/09/2017)  . CVA (cerebral vascular accident) ([place]) 02/2018  . Heart murmur   . High cholesterol   . History of kidney stones   . Hypertension   . LBBB (left bundle branch block)    Patient states that she was told by her PCP that she has left bundle blockage  . Osteoarthritis    "some of the joints" (12/09/2017)  . Pneumonia    "when I was a child" (12/09/2017)  . PONV (postoperative nausea and vomiting)      PHYSICAL EXAM:   General: Chronically ill appearing elderly female sitting on sofa Pulmonary: Increased respiratory effort.  No audible wheezing Extremities: Gross edema to level of  thighs Neurological: A&O x3.  Appropriate communication  Gonzella Lex, NP-C

## 2018-07-21 ENCOUNTER — Other Ambulatory Visit: Payer: Self-pay

## 2018-07-23 DIAGNOSIS — I5022 Chronic systolic (congestive) heart failure: Secondary | ICD-10-CM | POA: Diagnosis not present

## 2018-07-24 LAB — BASIC METABOLIC PANEL
BUN/Creatinine Ratio: 22 (ref 12–28)
BUN: 21 mg/dL (ref 8–27)
CO2: 21 mmol/L (ref 20–29)
Calcium: 9.1 mg/dL (ref 8.7–10.3)
Chloride: 95 mmol/L — ABNORMAL LOW (ref 96–106)
Creatinine, Ser: 0.94 mg/dL (ref 0.57–1.00)
GFR calc Af Amer: 63 mL/min/{1.73_m2} (ref >59)
GFR calc non Af Amer: 55 mL/min/{1.73_m2} — ABNORMAL LOW (ref >59)
Glucose: 111 mg/dL — ABNORMAL HIGH (ref 65–99)
Potassium: 5.1 mmol/L (ref 3.5–5.2)
Sodium: 132 mmol/L — ABNORMAL LOW (ref 134–144)

## 2018-07-31 ENCOUNTER — Telehealth: Payer: Self-pay

## 2018-07-31 ENCOUNTER — Telehealth: Payer: Self-pay | Admitting: Cardiology

## 2018-07-31 NOTE — Telephone Encounter (Signed)
Phone call placed to patient to check in. VM left with call back information provided

## 2018-07-31 NOTE — Progress Notes (Signed)
Virtual Visit via Video Note   This visit type was conducted due to national recommendations for restrictions regarding the COVID-19 Pandemic (e.g. social distancing) in an effort to limit this patient's exposure and mitigate transmission in our community.  Due to her co-morbid illnesses, this patient is at least at moderate risk for complications without adequate follow up.  This format is felt to be most appropriate for this patient at this time.  All issues noted in this document were discussed and addressed.  A limited physical exam was performed with this format.  Please refer to the patient's chart for her consent to telehealth for [facility].   Date:  08/03/2018   ID:  Haley Costa, Haley Costa Mar 12, 1929, MRN [id number]  Patient Location: Home Provider Location: Home  PCP:  [name], DO  Cardiologist:  Dr Stanford Breed  Evaluation Performed:  Follow-Up Visit  Chief Complaint:  FU CHF  History of Present Illness:    Follow-up congestive heart failure. Patient admitted with chest pain and CHF in September 2019. Echocardiogram showed ejection fraction 25%. Cardiac catheterization was discussed with patient but she declined and preferred medical therapy. She was placed on Entresto and spironolactone but developed hypotension resulting in syncope. Had CVA December 2019. Patient had thrombectomy of right M1 occlusion. She was found to have atrial fibrillation on telemetry and Eliquis was initiated. Echocardiogram repeated February 2020 and showed ejection fraction 20%.  Right ventricular function was moderately reduced and there was moderate pulmonary hypertension.  There was severe biatrial enlargement, severe mitral regurgitation and moderate tricuspid regurgitation.  Patient noted increased CHF symptoms at last telehealth visit.  Diuretics were increased. Since last seen patient continues to have some dyspnea on exertion.  She describes bilateral lower extremity edema.  She had a  cramping sensation in her chest 2 nights ago relieved with nitroglycerin.  No syncope or bleeding.  The patient does not have symptoms concerning for COVID-19 infection (fever, chills, cough, or new shortness of breath).    Past Medical History:  Diagnosis Date   Breast cancer, left breast ([place]) 1992   S/P mastectomy & chemo   CHF (congestive heart failure) (HCC)    Chronic back pain    "lower and middle back; I've had several broken vertebrae" (12/09/2017)   CVA (cerebral vascular accident) ([place]) 02/2018   Heart murmur    High cholesterol    History of kidney stones    Hypertension    LBBB (left bundle branch block)    Patient states that she was told by her PCP that she has left bundle blockage   Osteoarthritis    "some of the joints" (12/09/2017)   Pneumonia    "when I was a child" (12/09/2017)   PONV (postoperative nausea and vomiting)    Past Surgical History:  Procedure Laterality Date   APPENDECTOMY     BACK SURGERY     CATARACT EXTRACTION W/ INTRAOCULAR LENS IMPLANT Right    FIXATION KYPHOPLASTY THORACIC SPINE  2014   IR CT HEAD LTD  02/28/2018   IR PERCUTANEOUS ART THROMBECTOMY/INFUSION INTRACRANIAL INC DIAG ANGIO  02/28/2018   MASTECTOMY Left 1992   RADIOLOGY WITH ANESTHESIA N/A 02/28/2018   Procedure: RADIOLOGY WITH ANESTHESIA;  Surgeon: [name], MD;  Location: [facility];  Service: Radiology;  Laterality: N/A;     Current Meds  Medication Sig   apixaban (ELIQUIS) 5 MG TABS tablet Take 1 tablet (5 mg total) by mouth 2 (two) times daily.   carvedilol (COREG) 6.25  MG tablet Take 0.5 tablets (3.125 mg total) by mouth 2 (two) times daily with a meal.   furosemide (LASIX) 20 MG tablet Take 20 mg by mouth daily. Alternating with 40 mg   losartan (COZAAR) 25 MG tablet Take 1 tablet (25 mg total) by mouth daily.   nitroGLYCERIN (NITROSTAT) 0.4 MG SL tablet Place 1 tablet (0.4 mg total) under the tongue every 5 (five) minutes as needed for  chest pain.   ondansetron (ZOFRAN) 4 MG tablet Take 1 tablet by mouth every 4 (four) hours as needed.   polyethylene glycol (MIRALAX / GLYCOLAX) packet Take 17 g by mouth 2 (two) times daily.   potassium chloride 20 MEQ TBCR Take 20 mEq by mouth daily.   spironolactone (ALDACTONE) 25 MG tablet Take 1 tablet (25 mg total) by mouth daily.     Allergies:   Chlorthalidone; Lisinopril; and Persantine [dipyridamole]   Social History   Tobacco Use   Smoking status: Never Smoker   Smokeless tobacco: Never Used  Substance Use Topics   Alcohol use: Never    Frequency: Never   Drug use: Never     Family Hx: The patient's family history includes Cervical cancer in her mother; Hypertension in her brother.  ROS:   Please see the history of present illness.    No fevers, chills or productive cough.  Patient complains of back pain. All other systems reviewed and are negative.   Recent Labs: 12/09/2017: TSH 1.091 02/28/2018: Magnesium 2.0 03/21/2018: B Natriuretic Peptide 1,575.2 04/25/2018: ALT 20 04/26/2018: Hemoglobin 10.6; Platelets 207 07/23/2018: BUN 21; Creatinine, Ser 0.94; Potassium 5.1; Sodium 132   Recent Lipid Panel Lab Results  Component Value Date/Time   CHOL 174 02/28/2018 03:03 AM   TRIG 57 02/28/2018 10:10 AM   HDL 60 02/28/2018 03:03 AM   CHOLHDL 2.9 02/28/2018 03:03 AM   LDLCALC 98 02/28/2018 03:03 AM    Wt Readings from Last 3 Encounters:  08/03/18 150 lb (68 kg)  07/16/18 146 lb (66.2 kg)  04/25/18 126 lb (57.2 kg)     Objective:    Vital Signs:  BP 125/65    Pulse 76    Wt 150 lb (68 kg)    BMI 28.34 kg/m    VITAL SIGNS:  reviewed  No acute distress Answers questions appropriately Normal affect Remainder of physical examination not performed (telehealth visit; coronavirus pandemic)  ASSESSMENT & PLAN:    1. Chronic systolic congestive heart failure-based on history patient remains volume overloaded.  We will increase Lasix to 80 mg daily.  She was  not taking her Spironolactone and we will add 25 mg daily.  Check potassium and renal function in 1 week.  We discussed importance of fluid restriction and low-sodium diet. 2. Cardiomyopathy-patient has requested only medical therapy and no aggressive evaluation such as cardiac catheterization.  Continue ARB and beta-blocker.  She did not tolerate Entresto because of hypotension. 3. Paroxysmal atrial fibrillation-continue beta-blocker.  Continue present dose of apixaban. 4. Hypertension-patient's blood pressure is controlled.  Continue present medications. 5. Severe mitral regurgitation-patient request only medical therapy. 6. Hyperlipidemia-managed by primary care.  COVID-19 Education: The importance of social distancing was discussed today.  Time:   Today, I have spent 15 minutes with the patient with telehealth technology discussing the above problems.     Medication Adjustments/Labs and Tests Ordered: Current medicines are reviewed at length with the patient today.  Concerns regarding medicines are outlined above.   Tests Ordered: No orders of the defined  types were placed in this encounter.   Medication Changes: No orders of the defined types were placed in this encounter.   Disposition:  Follow up in 6 month(s)  Signed, Kirk Ruths, MD  08/03/2018 9:26 AM    [place]

## 2018-07-31 NOTE — Telephone Encounter (Signed)
Mychart pending, smartphone (grand daughter's phone), consent, pre reg complete 07/31/18 AF

## 2018-08-03 ENCOUNTER — Telehealth (INDEPENDENT_AMBULATORY_CARE_PROVIDER_SITE_OTHER): Payer: Medicare Other | Admitting: Cardiology

## 2018-08-03 VITALS — BP 125/65 | HR 76 | Wt 150.0 lb

## 2018-08-03 DIAGNOSIS — I420 Dilated cardiomyopathy: Secondary | ICD-10-CM

## 2018-08-03 DIAGNOSIS — I480 Paroxysmal atrial fibrillation: Secondary | ICD-10-CM

## 2018-08-03 DIAGNOSIS — I5023 Acute on chronic systolic (congestive) heart failure: Secondary | ICD-10-CM

## 2018-08-03 MED ORDER — FUROSEMIDE 40 MG PO TABS: 80.0000 mg | ORAL_TABLET | Freq: Every day | ORAL | 3 refills | Status: DC

## 2018-08-03 MED ORDER — SPIRONOLACTONE 25 MG PO TABS: 25.0000 mg | ORAL_TABLET | Freq: Every day | ORAL | 3 refills | Status: DC

## 2018-08-03 NOTE — Patient Instructions (Signed)
Medication Instructions:  START SPIRONOLACTONE 25 MG ONCE DAILY  CHANGE FUROSEMIDE TO 80 MG ONCE DAILY=4 OF THE 20 MG TABLETS ONCE DAILY  If you need a refill on your cardiac medications before your next appointment, please call your pharmacy.   Lab work: Your physician recommends that you return for lab work in: [place] If you have labs (blood work) drawn today and your tests are completely normal, you will receive your results only by: Marland Kitchen MyChart Message (if you have MyChart) OR . A paper copy in the mail If you have any lab test that is abnormal or we need to change your treatment, we will call you to review the results.  Follow-Up: At [facility], you and your health needs are our priority.  As part of our continuing mission to provide you with exceptional heart care, we have created designated Provider Care Teams.  These Care Teams include your primary Cardiologist (physician) and Advanced Practice Providers (APPs -  Physician Assistants and Nurse Practitioners) who all work together to provide you with the care you need, when you need it. Your physician recommends that you schedule a follow-up appointment in: [place] IN 2 WEEKS  Your physician recommends that you schedule a follow-up appointment in: [place]

## 2018-08-06 ENCOUNTER — Telehealth: Payer: Self-pay | Admitting: Cardiology

## 2018-08-06 NOTE — Telephone Encounter (Signed)
° ° ° °  Pt c/o medication issue:  1. Name of Medication: spironolactone (ALDACTONE) 25 MG tablet  2. How are you currently taking this medication (dosage and times per day)? Has not started medication  3. Are you having a reaction (difficulty breathing--STAT)? no  4. What is your medication issue?  Patient states she is afraid to take medication due to possible side effects

## 2018-08-06 NOTE — Telephone Encounter (Signed)
Patient was worried about taking it at the exact same time daily and whether to take with food or not.She has agreed to start taking it and have lab work done in 1 week

## 2018-08-12 ENCOUNTER — Telehealth: Payer: Self-pay | Admitting: Cardiology

## 2018-08-12 NOTE — Telephone Encounter (Signed)
Mychart pending, smartphone, pre reg complete 08/12/18 AF

## 2018-08-12 NOTE — Telephone Encounter (Signed)
New Message    [name] is calling and is wondering how long before the pts appt does she need the labs done    Please call

## 2018-08-12 NOTE — Telephone Encounter (Signed)
Granddaughter is aware to check labs after 1 week of being on Spironolactone Pt will have labs done on Friday ./cy

## 2018-08-14 ENCOUNTER — Telehealth: Payer: Self-pay

## 2018-08-14 DIAGNOSIS — I5023 Acute on chronic systolic (congestive) heart failure: Secondary | ICD-10-CM | POA: Diagnosis not present

## 2018-08-14 NOTE — Telephone Encounter (Signed)
Phone call placed to patient to check in. VM left

## 2018-08-15 LAB — BASIC METABOLIC PANEL WITH GFR
BUN/Creatinine Ratio: 16 (ref 12–28)
BUN: 17 mg/dL (ref 8–27)
CO2: 25 mmol/L (ref 20–29)
Calcium: 8.9 mg/dL (ref 8.7–10.3)
Chloride: 96 mmol/L (ref 96–106)
Creatinine, Ser: 1.09 mg/dL — ABNORMAL HIGH (ref 0.57–1.00)
GFR calc Af Amer: 53 mL/min/{1.73_m2} — ABNORMAL LOW
GFR calc non Af Amer: 46 mL/min/{1.73_m2} — ABNORMAL LOW
Glucose: 110 mg/dL — ABNORMAL HIGH (ref 65–99)
Potassium: 4.5 mmol/L (ref 3.5–5.2)
Sodium: 133 mmol/L — ABNORMAL LOW (ref 134–144)

## 2018-08-17 ENCOUNTER — Telehealth (INDEPENDENT_AMBULATORY_CARE_PROVIDER_SITE_OTHER): Payer: Medicare Other | Admitting: Cardiology

## 2018-08-17 ENCOUNTER — Encounter: Payer: Self-pay | Admitting: *Deleted

## 2018-08-17 ENCOUNTER — Telehealth: Payer: Self-pay

## 2018-08-17 VITALS — BP 110/55 | HR 68 | Ht 61.0 in | Wt 144.0 lb

## 2018-08-17 DIAGNOSIS — I429 Cardiomyopathy, unspecified: Secondary | ICD-10-CM | POA: Insufficient documentation

## 2018-08-17 DIAGNOSIS — I480 Paroxysmal atrial fibrillation: Secondary | ICD-10-CM

## 2018-08-17 DIAGNOSIS — I447 Left bundle-branch block, unspecified: Secondary | ICD-10-CM | POA: Insufficient documentation

## 2018-08-17 DIAGNOSIS — I5022 Chronic systolic (congestive) heart failure: Secondary | ICD-10-CM

## 2018-08-17 DIAGNOSIS — Z7901 Long term (current) use of anticoagulants: Secondary | ICD-10-CM | POA: Insufficient documentation

## 2018-08-17 NOTE — Progress Notes (Signed)
Virtual Visit via Video Note   This visit type was conducted due to national recommendations for restrictions regarding the COVID-19 Pandemic (e.g. social distancing) in an effort to limit this patient's exposure and mitigate transmission in our community.  Due to her co-morbid illnesses, this patient is at least at moderate risk for complications without adequate follow up.  This format is felt to be most appropriate for this patient at this time.  All issues noted in this document were discussed and addressed.  A limited physical exam was performed with this format.  Please refer to the patient's chart for her consent to telehealth for [facility].   Date:  08/17/2018   ID:  Haley Costa, Haley Costa June 04, 1928, MRN [id number]  Patient Location: Home Provider Location: Home  PCP:  Haley Morning, DO  Cardiologist:  Dr Stanford Breed Electrophysiologist:  None   Evaluation Performed:  Follow-Up Visit  Chief Complaint:  edema  History of Present Illness:    Haley Costa is a pleasant 85 y.o. female with of cardiomyopathy of undetermined etiology, she declined cath and opted for medical rx only.  She could not tolerate Entresto secondary to hypotension.  In Dec 2019 she presented with a stroke and was found to be in AF.  She was placed on Eliquis and has remained in AF with CVR on her last EKG from Feb 2020.  She was contacted today for a f/u visit.  Her granddaughter was present and helped with the video.  Dr. Stanford Breed saw her on 08/03/2018.  The patient had gained weight and had edema.  It became apparent that the patient was not taking her Aldactone.  He resumed this and increased her Lasix to 80 mg daily. F/U labs done on 5/29 showed a K+ of 4.5, BUN 17 and SCr 1.09.  Since that visit she has done well, no hypotension, no unusual dyspnea.  She still has LE edema.  Her weight is down 6 lbs.   The patient does not have symptoms concerning for COVID-19 infection (fever, chills, cough, or new  shortness of breath).    Past Medical History:  Diagnosis Date  . Breast cancer, left breast ([place]) 1992   S/P mastectomy & chemo  . CHF (congestive heart failure) ([place])   . Chronic back pain    "lower and middle back; I've had several broken vertebrae" (12/09/2017)  . CVA (cerebral vascular accident) ([place]) 02/2018  . Heart murmur   . High cholesterol   . History of kidney stones   . Hypertension   . LBBB (left bundle branch block)    Patient states that she was told by her PCP that she has left bundle blockage  . Osteoarthritis    "some of the joints" (12/09/2017)  . Pneumonia    "when I was a child" (12/09/2017)  . PONV (postoperative nausea and vomiting)    Past Surgical History:  Procedure Laterality Date  . APPENDECTOMY    . BACK SURGERY    . CATARACT EXTRACTION W/ INTRAOCULAR LENS IMPLANT Right   . FIXATION KYPHOPLASTY THORACIC SPINE  2014  . IR CT HEAD LTD  02/28/2018  . IR PERCUTANEOUS ART THROMBECTOMY/INFUSION INTRACRANIAL INC DIAG ANGIO  02/28/2018  . MASTECTOMY Left 1992  . RADIOLOGY WITH ANESTHESIA N/A 02/28/2018   Procedure: RADIOLOGY WITH ANESTHESIA;  Surgeon: [name], MD;  Location: [facility];  Service: Radiology;  Laterality: N/A;     Current Meds  Medication Sig  . apixaban (ELIQUIS) 5 MG TABS tablet  Take 1 tablet (5 mg total) by mouth 2 (two) times daily.  . carvedilol (COREG) 6.25 MG tablet Take 0.5 tablets (3.125 mg total) by mouth 2 (two) times daily with a meal.  . furosemide (LASIX) 40 MG tablet Take 2 tablets (80 mg total) by mouth daily.  Marland Kitchen losartan (COZAAR) 25 MG tablet Take 1 tablet (25 mg total) by mouth daily.  . nitroGLYCERIN (NITROSTAT) 0.4 MG SL tablet Place 1 tablet (0.4 mg total) under the tongue every 5 (five) minutes as needed for chest pain.  Marland Kitchen ondansetron (ZOFRAN) 4 MG tablet Take 1 tablet by mouth every 4 (four) hours as needed.  . polyethylene glycol (MIRALAX / GLYCOLAX) packet Take 17 g by mouth 2 (two) times daily.  . potassium  chloride 20 MEQ TBCR Take 20 mEq by mouth daily.  Marland Kitchen spironolactone (ALDACTONE) 25 MG tablet Take 1 tablet (25 mg total) by mouth daily.     Allergies:   Chlorthalidone; Lisinopril; and Persantine [dipyridamole]   Social History   Tobacco Use  . Smoking status: Never Smoker  . Smokeless tobacco: Never Used  Substance Use Topics  . Alcohol use: Never    Frequency: Never  . Drug use: Never     Family Hx: The patient's family history includes Cervical cancer in her mother; Hypertension in her brother.  ROS:   Please see the history of present illness.    All other systems reviewed and are negative.   Prior CV studies:   The following studies were reviewed today:   Labs/Other Tests and Data Reviewed:    EKG:  No ECG reviewed.  Recent Labs: 12/09/2017: TSH 1.091 02/28/2018: Magnesium 2.0 03/21/2018: B Natriuretic Peptide 1,575.2 04/25/2018: ALT 20 04/26/2018: Hemoglobin 10.6; Platelets 207 08/14/2018: BUN 17; Creatinine, Ser 1.09; Potassium 4.5; Sodium 133   Recent Lipid Panel Lab Results  Component Value Date/Time   CHOL 174 02/28/2018 03:03 AM   TRIG 57 02/28/2018 10:10 AM   HDL 60 02/28/2018 03:03 AM   CHOLHDL 2.9 02/28/2018 03:03 AM   LDLCALC 98 02/28/2018 03:03 AM    Wt Readings from Last 3 Encounters:  08/17/18 144 lb (65.3 kg)  08/03/18 150 lb (68 kg)  07/16/18 146 lb (66.2 kg)     Objective:    Vital Signs:  BP (!) 110/55   Pulse 68   Ht 5\' 1"  (1.549 m)   Wt 144 lb (65.3 kg)   BMI 27.21 kg/m    VITAL SIGNS:  reviewed The pt has bilateral LE edema ASSESSMENT & PLAN:    Chronic systolic CHF- Still volume overloaded on exam-I suggested she continue the Lasix 80 mg a day until her weight gets closer to 140 pounds.  At that point she can cut back to 40 mg a day.  I did suggest she stop her potassium supplement.  Cardiomyopathy- EF 20% Feb 2020  AF- Rate controlled  Anticoagulated- Eliquis  H/O CVA- Dec 2019  COVID-19 Education: The signs and  symptoms of COVID-19 were discussed with the patient and how to seek care for testing (follow up with PCP or arrange E-visit).  The importance of social distancing was discussed today.  Time:   Today, I have spent 15 minutes with the patient with telehealth technology discussing the above problems.     Medication Adjustments/Labs and Tests Ordered: Current medicines are reviewed at length with the patient today.  Concerns regarding medicines are outlined above.   Tests Ordered: No orders of the defined types were placed in  this encounter.   Medication Changes: No orders of the defined types were placed in this encounter.   Disposition:  Follow up Continue Lasix 80 mg a day until your weight gets closer to 140 pounds, at that point you can cut that back to 40 mg a day.  Stop your potassium supplement.  Limit fluids to 2 L a day.  Keep follow-up with Dr. Stanford Breed as scheduled in September.  Signed, Kerin Ransom, PA-C  08/17/2018 2:42 PM    [facility]

## 2018-08-17 NOTE — Telephone Encounter (Signed)
VERBAL CONSENT GIVEN TO ASHLEY FEATHERSTONE ON 08/12/2018 AT 2:35PM.      Virtual Visit Pre-Appointment Phone Call  "Mrs Caetano, I am calling you today to discuss your upcoming appointment. We are currently trying to limit exposure to the virus that causes COVID-19 by seeing patients at home rather than in the office."  1. "What is the BEST phone number to call the day of the visit?" - include this in appointment notes  2. "Do you have or have access to (through a family member/friend) a smartphone with video capability that we can use for your visit?" a. If yes - list this number in appt notes as "cell" (if different from BEST phone #) and list the appointment type as a VIDEO visit in appointment notes b. If no - list the appointment type as a PHONE visit in appointment notes  3. Confirm consent - "In the setting of the current Covid19 crisis, you are scheduled for a (phone or video) visit with your provider on (date) at (time).  Just as we do with many in-office visits, in order for you to participate in this visit, we must obtain consent.  If you'd like, I can send this to your mychart (if signed up) or email for you to review.  Otherwise, I can obtain your verbal consent now.  All virtual visits are billed to your insurance company just like a normal visit would be.  By agreeing to a virtual visit, we'd like you to understand that the technology does not allow for your provider to perform an examination, and thus may limit your provider's ability to fully assess your condition. If your provider identifies any concerns that need to be evaluated in person, we will make arrangements to do so.  Finally, though the technology is pretty good, we cannot assure that it will always work on either your or our end, and in the setting of a video visit, we may have to convert it to a phone-only visit.  In either situation, we cannot ensure that we have a secure connection.  Are you willing to proceed?"  STAFF: Did the patient verbally acknowledge consent to telehealth visit? Document YES/NO here: YES  4. Advise patient to be prepared - "Two hours prior to your appointment, go ahead and check your blood pressure, pulse, oxygen saturation, and your weight (if you have the equipment to check those) and write them all down. When your visit starts, your provider will ask you for this information. If you have an Apple Watch or Kardia device, please plan to have heart rate information ready on the day of your appointment. Please have a pen and paper handy nearby the day of the visit as well."  5. Give patient instructions for MyChart download to smartphone OR Doximity/[email or web address] as below if video visit (depending on what platform provider is using)  6. Inform patient they will receive a phone call 15 minutes prior to their appointment time (may be from unknown caller ID) so they should be prepared to answer    TELEPHONE CALL NOTE  Eshaal R Mcguffee has been deemed a candidate for a follow-up tele-health visit to limit community exposure during the Covid-19 pandemic. I spoke with the patient via phone to ensure availability of phone/video source, confirm preferred email & phone number, and discuss instructions and expectations.  I reminded Cordella R Eisenstein to be prepared with any vital sign and/or heart rhythm information that could potentially be obtained via home monitoring, at the time of  her visit. I reminded Shanyla R Boddy to expect a phone call prior to her visit.  Harold Hedge, CMA 08/17/2018 8:50 AM   INSTRUCTIONS FOR DOWNLOADING THE MYCHART APP TO SMARTPHONE  - The patient must first make sure to have activated MyChart and know their login information - If Apple, go to [employer] and type in MyChart in the search bar and download the app. If Android, ask patient to go to [employer] and type in [place] in the search bar and download the app. The app is free but as with any other app  downloads, their phone may require them to verify saved payment information or Apple/Android password.  - The patient will need to then log into the app with their MyChart username and password, and select [facility] as their healthcare provider to link the account. When it is time for your visit, go to the MyChart app, find appointments, and click Begin Video Visit. Be sure to Select Allow for your device to access the Microphone and Camera for your visit. You will then be connected, and your provider will be with you shortly.  **If they have any issues connecting, or need assistance please contact MyChart service desk [phone])**  **If using a computer, in order to ensure the best quality for their visit they will need to use either of the following Internet Browsers: [employer], or Google Chrome**  IF USING DOXIMITY or DOXY.ME - The patient will receive a link just prior to their visit by text.     FULL LENGTH CONSENT FOR TELE-HEALTH VISIT   I hereby voluntarily request, consent and authorize [facility] and its employed or contracted physicians, physician assistants, nurse practitioners or other licensed health care professionals (the Practitioner), to provide me with telemedicine health care services (the "Services") as deemed necessary by the treating Practitioner. I acknowledge and consent to receive the Services by the Practitioner via telemedicine. I understand that the telemedicine visit will involve communicating with the Practitioner through live audiovisual communication technology and the disclosure of certain medical information by electronic transmission. I acknowledge that I have been given the opportunity to request an in-person assessment or other available alternative prior to the telemedicine visit and am voluntarily participating in the telemedicine visit.  I understand that I have the right to withhold or withdraw my consent to the use of telemedicine  in the course of my care at any time, without affecting my right to future care or treatment, and that the Practitioner or I may terminate the telemedicine visit at any time. I understand that I have the right to inspect all information obtained and/or recorded in the course of the telemedicine visit and may receive copies of available information for a reasonable fee.  I understand that some of the potential risks of receiving the Services via telemedicine include:  Marland Kitchen Delay or interruption in medical evaluation due to technological equipment failure or disruption; . Information transmitted may not be sufficient (e.g. poor resolution of images) to allow for appropriate medical decision making by the Practitioner; and/or  . In rare instances, security protocols could fail, causing a breach of personal health information.  Furthermore, I acknowledge that it is my responsibility to provide information about my medical history, conditions and care that is complete and accurate to the best of my ability. I acknowledge that Practitioner's advice, recommendations, and/or decision may be based on factors not within their control, such as incomplete or inaccurate data provided by  me or distortions of diagnostic images or specimens that may result from electronic transmissions. I understand that the practice of medicine is not an exact science and that Practitioner makes no warranties or guarantees regarding treatment outcomes. I acknowledge that I will receive a copy of this consent concurrently upon execution via email to the email address I last provided but may also request a printed copy by calling the office of [facility].    I understand that my insurance will be billed for this visit.   I have read or had this consent read to me. . I understand the contents of this consent, which adequately explains the benefits and risks of the Services being provided via telemedicine.  . I have been provided ample  opportunity to ask questions regarding this consent and the Services and have had my questions answered to my satisfaction. . I give my informed consent for the services to be provided through the use of telemedicine in my medical care  By participating in this telemedicine visit I agree to the above.

## 2018-08-17 NOTE — Patient Instructions (Addendum)
Medication Instructions:  CONTINUE TAKING LASIX 80MG  ONCE A DAY UNTIL YOUR WEIGHT GET CLOSER TO 140 THEN DECREASE LASIX TO 40MG  ONCE A DAY  STOP Potassium If you need a refill on your cardiac medications before your next appointment, please call your pharmacy.   Lab work: None  If you have labs (blood work) drawn today and your tests are completely normal, you will receive your results only by: Marland Kitchen MyChart Message (if you have MyChart) OR . A paper copy in the mail If you have any lab test that is abnormal or we need to change your treatment, we will call you to review the results.  Testing/Procedures: None   Follow-Up: At [facility], you and your health needs are our priority.  As part of our continuing mission to provide you with exceptional heart care, we have created designated Provider Care Teams.  These Care Teams include your primary Cardiologist (physician) and Advanced Practice Providers (APPs -  Physician Assistants and Nurse Practitioners) who all work together to provide you with the care you need, when you need it. Marland Kitchen [facility] RECOMMENDS YOU FOLLOW UP IN September AS SCHEDULED WITH DR Stanford Breed  Any Other Special Instructions Will Be Listed Below (If Applicable).

## 2018-08-17 NOTE — Telephone Encounter (Signed)
Left detailed message on patients machine with AVS instructions. Left Haley Costa's discharge instructions on the voicemail and advised patient to contact office with any questions or concerns. AVS will be mailed to patient by medical records.

## 2018-09-16 ENCOUNTER — Telehealth: Payer: Self-pay | Admitting: Cardiology

## 2018-09-16 DIAGNOSIS — I5023 Acute on chronic systolic (congestive) heart failure: Secondary | ICD-10-CM

## 2018-09-16 NOTE — Telephone Encounter (Signed)
DC Spironolactone, change Lasix to 80 mg daily, check be met 1 week. Kirk Ruths

## 2018-09-16 NOTE — Telephone Encounter (Signed)
Spoke to granddaughter. She states patient has been nauseated today more than usually she had to use  Zofran  X2 .  Patient only took lasix 40 mg .    but granddaughter states patient has bee nausea every since starting spoirnolactone back in May 2020. Marland Kitchen Weight is 142 , granddaughter states when patient takes 40 mg  Rather than80 mg she  Increase her weight up t 2 lbs each time, dry weight is @ 140 lbs.  Patient was seen on 08/17/18 .  no blood pressure  Obtained - granddaughter states she will get one when she patient this afternoon.  Also granddaughter states patient has  Edema in legs - on right leg - she has water blister - one rupture  And is weeping . - granddaughter states.  Will defer to Dr Stanford Breed and contact her back

## 2018-09-16 NOTE — Telephone Encounter (Signed)
Spoke with pt dtr, Aware of dr crenshaw's recommendations. Lab orders mailed to the pt  

## 2018-09-16 NOTE — Telephone Encounter (Signed)
° °  Pt c/o medication issue:  1. Name of Medication: spironolactone (ALDACTONE) 25 MG tablet  2. How are you currently taking this medication (dosage and times per day)? 25 mg tablet 1x daily  3. Are you having a reaction (difficulty breathing--STAT)? no  4. What is your medication issue? Pt states the medicine is making her extremely nauseous.    Granddaughter states the patient has been extremely nauseous since starting the spirnolactone. The patient only took 40 mg of her fluid pill the past two days, due to feeling too miserable to walk back and forth to the bathroom. Patient took zofran 2x today to combat the nausea.  Granddaughter wants to know if there is possibly a smaller dosage of the medicine that the patient can take

## 2018-09-18 ENCOUNTER — Telehealth: Payer: Self-pay

## 2018-09-18 NOTE — Telephone Encounter (Signed)
Phone call placed to patient to check in and to offer visit with Palliative Care. Declined need for a visit but requested to communicate next week.

## 2018-09-25 ENCOUNTER — Telehealth: Payer: Self-pay

## 2018-09-25 NOTE — Telephone Encounter (Signed)
Phone call placed to patient to check in and to offer to schedule a visit with NP. VM left with call back information

## 2018-10-02 ENCOUNTER — Telehealth: Payer: Self-pay

## 2018-10-02 NOTE — Telephone Encounter (Signed)
Received a message from patient's grand daughter, Lexine Baton, regarding lab work. Contacted [facility] to inquire if they would be able to come out and draw labs. VM left for grand daughter, Lexine Baton, to make her aware of this and to offer to schedule a follow up visit with Palliative NP. Call back information provided.

## 2018-10-05 ENCOUNTER — Telehealth: Payer: Self-pay

## 2018-10-05 NOTE — Telephone Encounter (Signed)
Spoke with patient's grand daughter, Lexine Baton, who shared that patient has not been feeling well. Patient has demonstrated increased weakness and becomes easily fatigued. In May, patient was able to ambulate out of her home and go to doctors. Granddaughters shared that she is not able to do this now with out becoming exhausted. Visit scheduled with Palliative NP on 10/09/2018 @ 12noon

## 2018-10-09 ENCOUNTER — Telehealth: Payer: Self-pay

## 2018-10-09 ENCOUNTER — Other Ambulatory Visit: Payer: Medicare Other | Admitting: Internal Medicine

## 2018-10-09 DIAGNOSIS — I5023 Acute on chronic systolic (congestive) heart failure: Secondary | ICD-10-CM

## 2018-10-09 DIAGNOSIS — I429 Cardiomyopathy, unspecified: Secondary | ICD-10-CM

## 2018-10-09 DIAGNOSIS — I5022 Chronic systolic (congestive) heart failure: Secondary | ICD-10-CM

## 2018-10-09 DIAGNOSIS — Z79899 Other long term (current) drug therapy: Secondary | ICD-10-CM

## 2018-10-09 NOTE — Telephone Encounter (Signed)
Spoke with Hospice NP who report pt has increased edema up to waist line and crackles in both base. She denies SOB and vitals are as followed; BP 110/70, HR 68, O2 98% , weight 138. Per NP, pt has not been taking 80 mg of lasix as advised by Dr. Stanford Breed on 7/1. She report pt thought she was only suppose to take 40 mg daily and  80 if weight was greater than 140. Per Dr. Margaretann Loveless (DOD), pt should start taking 80 mg daily and have BMP in one week.   NP and granddaughter updated and voiced understanding. Left message to call back. Pt is currently unable to leave her home. A message will be sent to have remote home health draw labs.

## 2018-10-09 NOTE — Telephone Encounter (Signed)
[facility] Visit Initial Request  Date of Request ([place]):  October 09, 2018  Requesting Provider:  Dr. Stanford Breed   Agency Requested:    [facility] Contact:  Glory Buff, NP [address] [place], [place] [address] Phone #:  [phone] Fax #:  [phone]  Patient Demographic Information: Name:  Haley Costa Age:  85 y.o.   DOB:  Oct 10, 1928  MRN:  [id number]   Address:   [address] [place] [address]   Phone Numbers:   Home Phone [phone]  Mobile [phone]     Emergency Contact Information on File:   Contact Information    Name Relation Home Work Haley Costa Granddaughter   [phone]   Haley Costa   [phone]      The above family members may be contacted for information on this patient (review DPR on file):  Yes    Patient Clinical Information:  Primary Care Provider:  [name], DO  Primary Cardiologist:  No primary care provider on file.  Primary Electrophysiologist:  None   Past Medical Hx: Haley Costa  has a past medical history of Breast cancer, left breast ([place]) (1992), CHF (congestive heart failure) ([place]), Chronic back pain, CVA (cerebral vascular accident) ([place]) (02/2018), Heart murmur, High cholesterol, History of kidney stones, Hypertension, LBBB (left bundle branch block), Osteoarthritis, Pneumonia, and PONV (postoperative nausea and vomiting).   Allergies: She is allergic to chlorthalidone; lisinopril; and persantine [dipyridamole].   Medications: Current Outpatient Medications on File Prior to Visit  Medication Sig  . apixaban (ELIQUIS) 5 MG TABS tablet Take 1 tablet (5 mg total) by mouth 2 (two) times daily.  . carvedilol (COREG) 6.25 MG tablet Take 0.5 tablets (3.125 mg total) by mouth 2 (two) times daily with a meal.  . furosemide (LASIX) 40 MG tablet Take 2 tablets (80 mg total) by mouth daily.  Marland Kitchen losartan (COZAAR) 25 MG tablet Take 1 tablet (25 mg  total) by mouth daily.  . nitroGLYCERIN (NITROSTAT) 0.4 MG SL tablet Place 1 tablet (0.4 mg total) under the tongue every 5 (five) minutes as needed for chest pain.  Marland Kitchen ondansetron (ZOFRAN) 4 MG tablet Take 1 tablet by mouth every 4 (four) hours as needed.  . polyethylene glycol (MIRALAX / GLYCOLAX) packet Take 17 g by mouth 2 (two) times daily.  . potassium chloride 20 MEQ TBCR Take 20 mEq by mouth daily.   No current facility-administered medications on file prior to visit.      Social Hx: She  reports that she has never smoked. She has never used smokeless tobacco. She reports that she does not drink alcohol or use drugs.    Diagnosis/Reason for Visit:   Pt is unable to leave home as she is a hospice pt.   Services Requested:  Labs:  BMET  # of Visits Needed/Frequency per Week: 1  .

## 2018-10-12 ENCOUNTER — Other Ambulatory Visit: Payer: Self-pay

## 2018-10-14 DIAGNOSIS — I5023 Acute on chronic systolic (congestive) heart failure: Secondary | ICD-10-CM | POA: Diagnosis not present

## 2018-10-14 NOTE — Progress Notes (Unsigned)
    [facility] Pall[facility] Consult Note Telephone: [phone]  Fax: [phone]  PATIENT NAME: Haley Costa DOB: 1928-07-29 MRN: [id number]  PRIMARY CARE PROVIDER:   [name], DO  REFERRING PROVIDER:  [name], DO [place],  [place] [address]  RESPONSIBLE PARTY:     ASSESSMENT:        RECOMMENDATIONS and PLAN:  1.  I spent *** minutes providing this consultation,  from *** to ***. More than 50% of the time in this consultation was spent coordinating communication.   HISTORY OF PRESENT ILLNESS:  Haley Costa is a 85 y.o. year old female with multiple medical problems including ***. Palliative Care was asked to help address goals of care.   CODE STATUS:   PPS: 0% HOSPICE ELIGIBILITY/DIAGNOSIS: TBD  PAST MEDICAL HISTORY:  Past Medical History:  Diagnosis Date  . Breast cancer, left breast ([facility]) 1992   S/P mastectomy & chemo  . CHF (congestive heart failure) ([facility])   . Chronic back pain    "lower and middle back; I've had several broken vertebrae" (12/09/2017)  . CVA (cerebral vascular accident) ([facility]) 02/2018  . Heart murmur   . High cholesterol   . History of kidney stones   . Hypertension   . LBBB (left bundle branch block)    Patient states that she was told by her PCP that she has left bundle blockage  . Osteoarthritis    "some of the joints" (12/09/2017)  . Pneumonia    "when I was a child" (12/09/2017)  . PONV (postoperative nausea and vomiting)     SOCIAL HX:  Social History   Tobacco Use  . Smoking status: Never Smoker  . Smokeless tobacco: Never Used  Substance Use Topics  . Alcohol use: Never    Frequency: Never    ALLERGIES:  Allergies  Allergen Reactions  . Chlorthalidone Swelling  . Lisinopril Swelling    Eye  And cheek swelling  . Persantine [Dipyridamole] Other (See Comments)    unknown     PERTINENT MEDICATIONS:  Outpatient Encounter Medications as of  10/09/2018  Medication Sig  . apixaban (ELIQUIS) 5 MG TABS tablet Take 1 tablet (5 mg total) by mouth 2 (two) times daily.  . carvedilol (COREG) 6.25 MG tablet Take 0.5 tablets (3.125 mg total) by mouth 2 (two) times daily with a meal.  . furosemide (LASIX) 40 MG tablet Take 2 tablets (80 mg total) by mouth daily.  Marland Kitchen losartan (COZAAR) 25 MG tablet Take 1 tablet (25 mg total) by mouth daily.  . nitroGLYCERIN (NITROSTAT) 0.4 MG SL tablet Place 1 tablet (0.4 mg total) under the tongue every 5 (five) minutes as needed for chest pain.  Marland Kitchen ondansetron (ZOFRAN) 4 MG tablet Take 1 tablet by mouth every 4 (four) hours as needed.  . polyethylene glycol (MIRALAX / GLYCOLAX) packet Take 17 g by mouth 2 (two) times daily.  . potassium chloride 20 MEQ TBCR Take 20 mEq by mouth daily.   No facility-administered encounter medications on file as of 10/09/2018.     PHYSICAL EXAM:   General: NAD, frail appearing, thin Cardiovascular: regular rate and rhythm Pulmonary: clear ant fields Abdomen: soft, nontender, + bowel sounds GU: no suprapubic tenderness Extremities: no edema, no joint deformities Skin: no rashes Neurological: Weakness but otherwise nonfocal  Gonzella Lex, NP

## 2018-10-15 ENCOUNTER — Telehealth: Payer: Self-pay | Admitting: *Deleted

## 2018-10-15 DIAGNOSIS — I5022 Chronic systolic (congestive) heart failure: Secondary | ICD-10-CM

## 2018-10-15 LAB — BASIC METABOLIC PANEL
BUN/Creatinine Ratio: 22 (ref 12–28)
BUN: 18 mg/dL (ref 8–27)
CO2: 24 mmol/L (ref 20–29)
Calcium: 9.1 mg/dL (ref 8.7–10.3)
Chloride: 91 mmol/L — ABNORMAL LOW (ref 96–106)
Creatinine, Ser: 0.83 mg/dL (ref 0.57–1.00)
GFR calc Af Amer: 73 mL/min/{1.73_m2} (ref >59)
GFR calc non Af Amer: 64 mL/min/{1.73_m2} (ref >59)
Glucose: 107 mg/dL — ABNORMAL HIGH (ref 65–99)
Potassium: 3.4 mmol/L — ABNORMAL LOW (ref 3.5–5.2)
Sodium: 133 mmol/L — ABNORMAL LOW (ref 134–144)

## 2018-10-15 NOTE — Telephone Encounter (Addendum)
Spoke with pt granddaughter, she reports the patient has stopped the potassium. Aware of dr Jacalyn Lefevre recommendations. Lab orders mailed to the pt   ----- Message from [name], MD sent at 10/15/2018  7:18 AM EDT ----- Kdur 20 meq daily; if already taking 20, increase to 40 bmet 2 weeks Kirk Ruths

## 2018-11-03 ENCOUNTER — Other Ambulatory Visit: Payer: Self-pay | Admitting: Cardiology

## 2018-11-05 ENCOUNTER — Other Ambulatory Visit: Payer: Self-pay | Admitting: Cardiology

## 2018-11-09 ENCOUNTER — Telehealth: Payer: Self-pay | Admitting: Internal Medicine

## 2018-11-09 ENCOUNTER — Telehealth: Payer: Self-pay | Admitting: Cardiology

## 2018-11-09 ENCOUNTER — Telehealth: Payer: Self-pay | Admitting: *Deleted

## 2018-11-09 DIAGNOSIS — E876 Hypokalemia: Secondary | ICD-10-CM

## 2018-11-09 DIAGNOSIS — Z79899 Other long term (current) drug therapy: Secondary | ICD-10-CM

## 2018-11-09 NOTE — Telephone Encounter (Signed)
Patient's granddaughter called wanting to have a home visit set up to have labs drawn for her grandmother.

## 2018-11-09 NOTE — Telephone Encounter (Signed)
Spoke with pt dtr, Follow up scheduled  

## 2018-11-09 NOTE — Telephone Encounter (Signed)
Granddaughter Coralie Keens called and wanted to schedule a Palliative f/u visit, this was scheduled for 11/17/18 @ 11 AM.

## 2018-11-09 NOTE — Telephone Encounter (Signed)
[name] can not get her grandmother out at this point. She would to make appointment with Dr Stanford Breed next month virtual. Will forward to Luisa Dago RN for review

## 2018-11-09 NOTE — Telephone Encounter (Signed)
   [place] Visit Follow Up Request   Date of Request ([place]):  November 09, 2018  Requesting Provider:  Dr Stanford Breed    Agency Requested:    [facility] Contact:  Glory Buff, NP [address] [place], [place] [address] Phone #:  [phone] Fax #:  [phone]  Patient Demographic Information: Name:  Haley Costa Age:  85 y.o.   DOB:  08-04-1928  MRN:  [id number]   Home visit progress note(s), lab results, telemetry strips, etc were reviewed.  Provider Recommendations: Patient needs follow up BMET   Follow up home services requested:  Labs:  BMET   All labs ordered for this home visit have been released and the request was sent to Chrissie Noa at [facility].

## 2018-11-11 DIAGNOSIS — E876 Hypokalemia: Secondary | ICD-10-CM | POA: Diagnosis not present

## 2018-11-11 DIAGNOSIS — Z79899 Other long term (current) drug therapy: Secondary | ICD-10-CM | POA: Diagnosis not present

## 2018-11-12 ENCOUNTER — Encounter: Payer: Self-pay | Admitting: *Deleted

## 2018-11-12 LAB — BASIC METABOLIC PANEL
BUN/Creatinine Ratio: 21 (ref 12–28)
BUN: 23 mg/dL (ref 8–27)
CO2: 23 mmol/L (ref 20–29)
Calcium: 8.8 mg/dL (ref 8.7–10.3)
Chloride: 97 mmol/L (ref 96–106)
Creatinine, Ser: 1.09 mg/dL — ABNORMAL HIGH (ref 0.57–1.00)
GFR calc Af Amer: 53 mL/min/{1.73_m2} — ABNORMAL LOW (ref >59)
GFR calc non Af Amer: 46 mL/min/{1.73_m2} — ABNORMAL LOW (ref >59)
Glucose: 158 mg/dL — ABNORMAL HIGH (ref 65–99)
Potassium: 4.1 mmol/L (ref 3.5–5.2)
Sodium: 136 mmol/L (ref 134–144)

## 2018-11-12 NOTE — Progress Notes (Signed)
Haley Costa           DOB: [birth year]  Purpose of Visit: Lab draw: BMET Ordering provider:  [facility]  Medications: Is the patient taking all medications listed on MAR from Epic? Yes except Miralax is QD not BID   List any medications that are not being taken correctly: none  List any medication refills needed: none  Is the patient able to pick up medications? No but family does this  Vitals: BP:  107/73   HR: 74   Oxygen: Weight: 143lbs        Any patient concerns?  States she now mainly uses her walker rather than her cane but reports no falls.   ReDS Vest/Clip Reading: n/a  Rhythm Strip: n/a  Is Home Health recommended? No If yes, state reason:   BMET drawn and brought to the [facility] on [address]   [place], [place] 11/12/18

## 2018-11-17 ENCOUNTER — Other Ambulatory Visit: Payer: Self-pay

## 2018-11-17 ENCOUNTER — Other Ambulatory Visit: Payer: Medicare Other | Admitting: Internal Medicine

## 2018-11-24 ENCOUNTER — Telehealth: Payer: Medicare Other | Admitting: Cardiology

## 2018-12-01 ENCOUNTER — Other Ambulatory Visit: Payer: Self-pay

## 2018-12-01 ENCOUNTER — Ambulatory Visit (INDEPENDENT_AMBULATORY_CARE_PROVIDER_SITE_OTHER): Payer: Medicare Other | Admitting: Physician Assistant

## 2018-12-01 ENCOUNTER — Encounter: Payer: Self-pay | Admitting: Physician Assistant

## 2018-12-01 ENCOUNTER — Telehealth: Payer: Self-pay | Admitting: Cardiology

## 2018-12-01 VITALS — BP 122/80 | HR 81 | Temp 97.5°F | Ht 61.0 in | Wt 143.0 lb

## 2018-12-01 DIAGNOSIS — I10 Essential (primary) hypertension: Secondary | ICD-10-CM

## 2018-12-01 DIAGNOSIS — I480 Paroxysmal atrial fibrillation: Secondary | ICD-10-CM

## 2018-12-01 DIAGNOSIS — I639 Cerebral infarction, unspecified: Secondary | ICD-10-CM

## 2018-12-01 DIAGNOSIS — E785 Hyperlipidemia, unspecified: Secondary | ICD-10-CM

## 2018-12-01 DIAGNOSIS — Z7901 Long term (current) use of anticoagulants: Secondary | ICD-10-CM

## 2018-12-01 DIAGNOSIS — I5023 Acute on chronic systolic (congestive) heart failure: Secondary | ICD-10-CM

## 2018-12-01 MED ORDER — TORSEMIDE 20 MG PO TABS: ORAL_TABLET | ORAL | 3 refills | Status: DC

## 2018-12-01 NOTE — Telephone Encounter (Signed)
  Granddaughter is calling because patient is having severe nausea and she has Zofran but it seems to be making her worse. Please advise.

## 2018-12-01 NOTE — Patient Instructions (Addendum)
Medication Instructions:  STOP Losartan  STOP Furosemide.  START Torsemide 20 mg--take 40 mg (2 tab) every morning and 20 mg (1 tab) every evening. Your goal weight is 138-142 lbs. Once your reach your goal weight, take Torsemide 40 mg once daily, with extra 20 mg only as needed.  If you need a refill on your cardiac medications before your next appointment, please call your pharmacy.   Lab work: Your physician recommends that you return for lab work today and again in 2 weeks: BMET  If you have labs (blood work) drawn today and your tests are completely normal, you will receive your results only by: Marland Kitchen MyChart Message (if you have MyChart) OR . A paper copy in the mail If you have any lab test that is abnormal or we need to change your treatment, we will call you to review the results.  Follow-Up: At [facility], you and your health needs are our priority.  As part of our continuing mission to provide you with exceptional heart care, we have created designated Provider Care Teams.  These Care Teams include your primary Cardiologist (physician) and Advanced Practice Providers (APPs -  Physician Assistants and Nurse Practitioners) who all work together to provide you with the care you need, when you need it. . You have been scheduled for a follow-up appointment to see Almyra Deforest, PA on Monday, 12/21/18 at 11:00 AM.  Any Other Special Instructions Will Be Listed Below (If Applicable). Please call [facility] about whether they can come out to your house. Their office number is [phone].  Please call our office if you would like to have a [place].      WEIGH DAILY, every am, wearing the same amount of clothing Record weights, contact Kirk Ruths, MD for weight gain of 3 lbs in a day or 5 lbs in a week Limit sodium to 500 mg per meal, total 2000 mg per day Limit all liquids to 1.5-2 liters/quarts per day

## 2018-12-01 NOTE — Telephone Encounter (Signed)
Made appt with Rosaria Ferries, PA at 3:00pm 12/01/18.

## 2018-12-01 NOTE — Telephone Encounter (Signed)
Called pt granddaughter back regarding pt nausea. Asked if the pt was having any other symptoms like chest pain, SOB or arm numbness. Granddaughter states she occasionally has angina and has been taking Nitro 3 times/week or more. She also stated that that she is constantly SOB and has swelling on and off in her legs. I advised the granddaughter to get a weight on the pt every day and if she has a 3lb weight gain to take an extra dose of lasix and if that doesn't decrease her weight in 24hrs to call back. I also educated the granddaughter about how Nitro can cause nausea. The granddaughter stated that the pt is not compliant all the time and is not always able to get a daily weight. She stated the pt feels that she does not want to  progress care d/t fear of having another stroke. I advised the granddaughter to try her best to get the weights and that Dr. Stanford Breed would get this message. Also advised to make sure if the pt has unrelieved chest pain or has unrelieved SOB, to call 911 or go to the ER.

## 2018-12-01 NOTE — Telephone Encounter (Signed)
PAOV Brian [name]  

## 2018-12-01 NOTE — Progress Notes (Signed)
Cardiology Office Note   Date:  12/01/2018   ID:  Haley Costa, DOB 19-Feb-1929, MRN [id number]  PCP:  Haley Morning, DO Cardiologist:  Kirk Ruths, MD 08/03/2018 Susanne Borders , PA-C    History of Present Illness: Haley Costa is a 85 y.o. female with a history of CM (type unknown), intol Entresto 2nd low BP, CVA, PAF>>persistent Afib  07/2018 televisit>>Lasix increased, aldactone restarted 08/17/2018 televisit>>continue higher dose Lasix, goal wt 140 lbs, d/c Kdur  Haley Costa presents for cardiology follow up.  She weighs daily. Her weight will sometimes drop below 140, but has been as high as 145 (not recently).   She lives w/ her son. He uses the microwave to cook. Granddaughter does the shopping, she tries to pick low-Na options.   1-2 x week, she will wake in the night. She is not sure, it might be due to SOB. Sleeps on 1 pillow, logistics interfere w/ use of more.   She has to take NTG 1-2 x week. She will get chest tightness, it can start at rest. Sometimes it gets bad enough to take nitro, that makes the pain go away. 3-4/10, usually goes away with 1 nitro.   Most of the chest pain episodes happen on days when her weight is up.   She has significant dyspnea on exertion, can do almost nothing without getting short of breath.  She has lower extremity edema on a daily basis, left greater than right.  Her left leg is very large today and edematous.  There are some weeping lesions on the back of it as well.  There are no signs of infection.  The hospice nurse comes out once a month.  She used to have a home health RN, but they stated they would not be able to come out anymore his insurance would not cover it.  She is not eating very well because of nausea.  At first, they thought the nausea was caused by the Spironolactone.  The granddaughter also states that she was talking out of her head at times on this medication.  Zofran is not helping  much with the nausea, which she still has on a daily basis.   Past Medical History:  Diagnosis Date  . Breast cancer, left breast ([facility]) 1992   S/P mastectomy & chemo  . CHF (congestive heart failure) ([facility])   . Chronic back pain    "lower and middle back; I've had several broken vertebrae" (12/09/2017)  . CVA (cerebral vascular accident) ([facility]) 02/2018  . Heart murmur   . High cholesterol   . History of kidney stones   . Hypertension   . LBBB (left bundle branch block)    Patient states that she was told by her PCP that she has left bundle blockage  . Osteoarthritis    "some of the joints" (12/09/2017)  . Pneumonia    "when I was a child" (12/09/2017)  . PONV (postoperative nausea and vomiting)     Past Surgical History:  Procedure Laterality Date  . APPENDECTOMY    . BACK SURGERY    . CATARACT EXTRACTION W/ INTRAOCULAR LENS IMPLANT Right   . FIXATION KYPHOPLASTY THORACIC SPINE  2014  . IR CT HEAD LTD  02/28/2018  . IR PERCUTANEOUS ART THROMBECTOMY/INFUSION INTRACRANIAL INC DIAG ANGIO  02/28/2018  . MASTECTOMY Left 1992  . RADIOLOGY WITH ANESTHESIA N/A 02/28/2018   Procedure: RADIOLOGY WITH ANESTHESIA;  Surgeon: [name], MD;  Location: [facility];  Service:  Radiology;  Laterality: N/A;    Current Outpatient Medications  Medication Sig Dispense Refill  . apixaban (ELIQUIS) 2.5 MG TABS tablet Take 2.5 mg by mouth 2 (two) times daily.    . carvedilol (COREG) 6.25 MG tablet Take 0.5 tablets (3.125 mg total) by mouth 2 (two) times daily with a meal. 180 tablet 1  . furosemide (LASIX) 40 MG tablet Take 2 tablets (80 mg total) by mouth daily. 180 tablet 3  . losartan (COZAAR) 25 MG tablet Take 1 tablet (25 mg total) by mouth daily. 30 tablet 0  . nitroGLYCERIN (NITROSTAT) 0.4 MG SL tablet DISSOLVE ONE TABLET UNDER THE TONGUE EVERY 5 MINUTES AS NEEDED FOR CHEST PAIN.  DO NOT EXCEED A TOTAL OF 3 DOSES IN 15 MINUTES 75 tablet 2  . ondansetron (ZOFRAN) 4 MG tablet Take 1 tablet by  mouth every 4 (four) hours as needed.    . polyethylene glycol (MIRALAX / GLYCOLAX) packet Take 17 g by mouth 2 (two) times daily. 14 each 0  . potassium chloride 20 MEQ TBCR Take 20 mEq by mouth daily. 30 tablet 0   No current facility-administered medications for this visit.     Allergies:   Chlorthalidone, Lisinopril, Persantine [dipyridamole], and Spironolactone    Social History:  The patient  reports that she has never smoked. She has never used smokeless tobacco. She reports that she does not drink alcohol or use drugs.   Family History:  The patient's family history includes Cervical cancer in her mother; Hypertension in her brother.  She indicated that the status of her mother is unknown. She indicated that the status of her brother is unknown.   ROS:  Please see the history of present illness. All other systems are reviewed and negative.    PHYSICAL EXAM: VS:  BP 122/80   Pulse 81   Temp (!) 97.5 F (36.4 C)   Ht 5\' 1"  (1.549 m)   Wt 143 lb (64.9 kg)   SpO2 98%   BMI 27.02 kg/m  , BMI Body mass index is 27.02 kg/m. GEN: Well nourished, well developed, female in no acute distress HEENT: normal for age  Neck: 9-10 cm JVD, no carotid bruit, no masses Cardiac: Irregular rate and rhythm; no murmur, no rubs, or gallops Respiratory: Decreased breath sounds bases bilaterally, normal work of breathing GI: soft, nontender, nondistended, + BS MS: no deformity or atrophy; 1+ edema right, 3+ edema left; distal pulses are 2+ in upper extremities, difficult to palpate in lower extremities due to edema Skin: warm and dry, no rash; there is some weeping areas on the back of her left calf, they are pink with no signs of infection Neuro:  Strength and sensation are intact Psych: euthymic mood, full affect   EKG:  EKG is ordered today. The ekg ordered today demonstrates atrial fibrillation with left axis deviation, QRS width 116 ms Q waves V1-3, 04/25/2018 ECG had lateral ST changes  that are not seen today  ECHO: 04/20/2018  1. The left ventricle has 20%. The cavity size is normal. The left ventricular diastology could not be evaluated secondary to atrial fibrillation.  2. There is severe akinesis of the basiliar-mid anteroseptal left ventricular segment.  3. There is severe akinesis of the entire inferior and inferoseptal left ventricular segments.  4. There is severe akinesis of the mid-apical inferolateral left ventricular segment.  5. The right ventricle is in size. There is moderately reduced systolic function. Right ventricular systolic pressure is moderately elevated with  an estimated pressure of 63.1 mmHg.  6. Severely dilated left atrial size.  7. The aortic valve tricuspid. There is severe thickening and moderate calcification of the aortic valve.  8. Severely dilated right atrial size.  9. The mitral valve is degenerative. There is severe thickening and mildly calcified of the anterior and posterior leaflets. There is moderate mitral annular calcification present. Regurgitation is severe by color flow Doppler. 10. Tricuspid regurgitation moderate. 11. Pulmonic valve regurgitation is mild by color flow Doppler. 12. No atrial level shunt detected by color flow Doppler. 13. The inferior vena cava was normal in size <50% respiratory variablity.   Recent Labs: 12/09/2017: TSH 1.091 02/28/2018: Magnesium 2.0 03/21/2018: B Natriuretic Peptide 1,575.2 04/25/2018: ALT 20 04/26/2018: Hemoglobin 10.6; Platelets 207 11/11/2018: BUN 23; Creatinine, Ser 1.09; Potassium 4.1; Sodium 136  CBC    Component Value Date/Time   WBC 9.2 04/26/2018 0500   RBC 3.29 (L) 04/26/2018 0500   HGB 10.6 (L) 04/26/2018 0500   HCT 31.6 (L) 04/26/2018 0500   PLT 207 04/26/2018 0500   MCV 96.0 04/26/2018 0500   MCH 32.2 04/26/2018 0500   MCHC 33.5 04/26/2018 0500   RDW 14.6 04/26/2018 0500   LYMPHSABS 0.9 03/21/2018 0828   MONOABS 0.4 03/21/2018 0828   EOSABS 0.1 03/21/2018 0828   BASOSABS  0.0 03/21/2018 0828   CMP Latest Ref Rng & Units 11/11/2018 10/14/2018 08/14/2018  Glucose 65 - 99 mg/dL 158(H) 107(H) 110(H)  BUN 8 - 27 mg/dL 23 18 17   Creatinine 0.57 - 1.00 mg/dL 1.09(H) 0.83 1.09(H)  Sodium 134 - 144 mmol/L 136 133(L) 133(L)  Potassium 3.5 - 5.2 mmol/L 4.1 3.4(L) 4.5  Chloride 96 - 106 mmol/L 97 91(L) 96  CO2 20 - 29 mmol/L 23 24 25   Calcium 8.7 - 10.3 mg/dL 8.8 9.1 8.9  Total Protein 6.5 - 8.1 g/dL - - -  Total Bilirubin 0.3 - 1.2 mg/dL - - -  Alkaline Phos 38 - 126 U/L - - -  AST 15 - 41 U/L - - -  ALT 0 - 44 U/L - - -     Lipid Panel Lab Results  Component Value Date   CHOL 174 02/28/2018   HDL 60 02/28/2018   LDLCALC 98 02/28/2018   TRIG 57 02/28/2018   CHOLHDL 2.9 02/28/2018      Wt Readings from Last 3 Encounters:  12/01/18 143 lb (64.9 kg)  11/12/18 143 lb (64.9 kg)  08/17/18 144 lb (65.3 kg)     Other studies Reviewed: Additional studies/ records that were reviewed today include: Office notes, hospital records and testing.  ASSESSMENT AND PLAN:  1.  Acute on chronic systolic CHF: - Although her weight is not up, her volume status is up. - They are concerned about how much Lasix that she is taking. -They feel that she is not losing weight on the Lasix and still very short of breath, despite compliance - I will try changing her to torsemide 40 mg a.m. and 20 mg p.m. for now, changed to 40 mg daily when she is at her goal weight - BMET today and in 2 weeks - Try to get home health RN to help with CHF management  2.  Lower extremity edema: - The family reassures me that she has not missed any doses of her Eliquis - Was concerned about DVT, but as she is compliant with Eliquis, that is unlikely - We will see if we can get wound care to see her,  she may benefit from [employer] boots or other kind of wrap on her left leg -In the meantime, get compression socks if they can be big enough she can tolerate them, or an Ace bandage with a dressing  underneath  3.  Presumed CAD: -She has wall motion abnormalities on her echocardiogram -She is having chest pain that is likely related to excess volume - Continue beta-blocker and statin, stop ARB secondary to nausea to see if this helps   Current medicines are reviewed at length with the patient today.  The patient has concerns regarding medicines.  The following changes have been made: Stop losartan, stop furosemide, start torsemide  Labs/ tests ordered today include:   Orders Placed This Encounter  Procedures  . Basic metabolic panel  . Basic metabolic panel  . EKG 12-Lead     Disposition:   FU with Kirk Ruths, MD  Signed, Rosaria Ferries, PA-C  12/01/2018 5:39 PM    [place] Phone: [phone]; Fax: [phone]

## 2018-12-02 LAB — BASIC METABOLIC PANEL
BUN/Creatinine Ratio: 26 (ref 12–28)
BUN: 27 mg/dL (ref 8–27)
CO2: 24 mmol/L (ref 20–29)
Calcium: 9.2 mg/dL (ref 8.7–10.3)
Chloride: 100 mmol/L (ref 96–106)
Creatinine, Ser: 1.05 mg/dL — ABNORMAL HIGH (ref 0.57–1.00)
GFR calc Af Amer: 55 mL/min/{1.73_m2} — ABNORMAL LOW (ref >59)
GFR calc non Af Amer: 48 mL/min/{1.73_m2} — ABNORMAL LOW (ref >59)
Glucose: 99 mg/dL (ref 65–99)
Potassium: 4.1 mmol/L (ref 3.5–5.2)
Sodium: 140 mmol/L (ref 134–144)

## 2018-12-16 ENCOUNTER — Telehealth: Payer: Self-pay | Admitting: Physician Assistant

## 2018-12-16 NOTE — Telephone Encounter (Signed)
RETURNED [place] OFF LASIX AND SHE DOES NOT THINK THAT PT IS EVEN GOING TO COME TO APPT IN THE OFFICE BECAUSE SHE DOES NOT LIKE TO LEAVE THE HOUSE "WITH HER HEART CONDITION". SHE STATES THAT WE SENT SOME OUT TO DRAW PT'S BLOOD A COUPLE WEEKS AGO AND IF WE STILL NEED TO HAVE BMET AGAIN SHE WILL NEED TO HAVE THE LABS DRAWN AT HOME AGAIN. PLEASE ADVISE.

## 2018-12-16 NOTE — Telephone Encounter (Signed)
Granddaughter of the patient wanted to know if the patient will need labs done again before her upcoming appointment with [name] on 10/05. The Granddaughter says that the patient is enrolled in a program with our office where we send a lab tech to the house to draw her labs. The patient is really reluctant to leave the house, so if the office could coordinate someone coming to the patient's house the Granddaughter would appreciate it.   Please call

## 2018-12-16 NOTE — Telephone Encounter (Signed)
Routing to [place] as well since pt scheduled with [name]. FYI.

## 2018-12-18 ENCOUNTER — Telehealth: Payer: Self-pay

## 2018-12-18 NOTE — Telephone Encounter (Signed)
Order to draw a BMET on Haley Costa received by [facility] today to be done before her f/u appt on Monday 10/5.  Called the phone number listed and spoke to the daughter, Lexine Baton who stated Ms. Arca doesn't like surprise visits nor could she get a hold of her to set up a visit.  Nikki asked for the appointment to be made for Monday 10/5.

## 2018-12-18 NOTE — Telephone Encounter (Signed)
[name] Visit Follow Up Request   Date of Request ([place]):  December 18, 2018  Requesting Provider:  Rosaria Ferries, PA-C     Agency Requested:    [facility] Contact:  Glory Buff, NP [address] [place], [place] [address] Phone #:  [phone] Fax #:  [phone]  Patient Demographic Information: Name:  Haley Costa Age:  85 y.o.   DOB:  [birth year]  MRN:  [id number]   Home visit progress note(s), lab results, telemetry strips, etc were reviewed.  Provider Recommendations: 2 WEEK follow up BMET  Follow up home services requested:  Labs:  BMET  All labs ordered for this home visit have been released and the request was sent to Chrissie Noa at [facility].

## 2018-12-21 ENCOUNTER — Ambulatory Visit: Payer: Medicare Other | Admitting: Physician Assistant

## 2018-12-21 DIAGNOSIS — I639 Cerebral infarction, unspecified: Secondary | ICD-10-CM | POA: Diagnosis not present

## 2018-12-21 DIAGNOSIS — Z7901 Long term (current) use of anticoagulants: Secondary | ICD-10-CM | POA: Diagnosis not present

## 2018-12-21 DIAGNOSIS — I480 Paroxysmal atrial fibrillation: Secondary | ICD-10-CM | POA: Diagnosis not present

## 2018-12-21 DIAGNOSIS — E785 Hyperlipidemia, unspecified: Secondary | ICD-10-CM | POA: Diagnosis not present

## 2018-12-21 DIAGNOSIS — I10 Essential (primary) hypertension: Secondary | ICD-10-CM | POA: Diagnosis not present

## 2018-12-22 LAB — BASIC METABOLIC PANEL
BUN/Creatinine Ratio: 24 (ref 12–28)
BUN: 24 mg/dL (ref 8–27)
CO2: 25 mmol/L (ref 20–29)
Calcium: 8.8 mg/dL (ref 8.7–10.3)
Chloride: 103 mmol/L (ref 96–106)
Creatinine, Ser: 1.01 mg/dL — ABNORMAL HIGH (ref 0.57–1.00)
GFR calc Af Amer: 58 mL/min/{1.73_m2} — ABNORMAL LOW (ref >59)
GFR calc non Af Amer: 50 mL/min/{1.73_m2} — ABNORMAL LOW (ref >59)
Glucose: 84 mg/dL (ref 65–99)
Potassium: 3.2 mmol/L — ABNORMAL LOW (ref 3.5–5.2)
Sodium: 141 mmol/L (ref 134–144)

## 2018-12-22 NOTE — Progress Notes (Signed)
Home meeting for BMET on behalf of [facility].  Ms. Vanhuss stated she was doing well overall other than her chronic but intermittent "upset stomach" she's had most of her life.  Son was home with her and she had no other complaints or requests.  BMET brought to [facility] on  [address]

## 2018-12-25 ENCOUNTER — Encounter (HOSPITAL_COMMUNITY): Payer: Self-pay | Admitting: *Deleted

## 2018-12-25 ENCOUNTER — Emergency Department (HOSPITAL_COMMUNITY): Payer: Medicare Other

## 2018-12-25 ENCOUNTER — Other Ambulatory Visit: Payer: Self-pay

## 2018-12-25 ENCOUNTER — Emergency Department (HOSPITAL_COMMUNITY)
Admission: EM | Admit: 2018-12-25 | Discharge: 2018-12-25 | Disposition: A | Discharge status: Home / Self Care | Payer: Medicare Other | Attending: Emergency Medicine | Admitting: Emergency Medicine

## 2018-12-25 DIAGNOSIS — R531 Weakness: Secondary | ICD-10-CM | POA: Diagnosis not present

## 2018-12-25 DIAGNOSIS — Z8673 Personal history of transient ischemic attack (TIA), and cerebral infarction without residual deficits: Secondary | ICD-10-CM | POA: Diagnosis not present

## 2018-12-25 DIAGNOSIS — Z7901 Long term (current) use of anticoagulants: Secondary | ICD-10-CM | POA: Insufficient documentation

## 2018-12-25 DIAGNOSIS — I110 Hypertensive heart disease with heart failure: Secondary | ICD-10-CM | POA: Diagnosis not present

## 2018-12-25 DIAGNOSIS — E8770 Fluid overload, unspecified: Secondary | ICD-10-CM | POA: Insufficient documentation

## 2018-12-25 DIAGNOSIS — Z79899 Other long term (current) drug therapy: Secondary | ICD-10-CM | POA: Insufficient documentation

## 2018-12-25 DIAGNOSIS — Z853 Personal history of malignant neoplasm of breast: Secondary | ICD-10-CM | POA: Insufficient documentation

## 2018-12-25 DIAGNOSIS — I4891 Unspecified atrial fibrillation: Secondary | ICD-10-CM | POA: Diagnosis not present

## 2018-12-25 DIAGNOSIS — I5032 Chronic diastolic (congestive) heart failure: Secondary | ICD-10-CM | POA: Diagnosis not present

## 2018-12-25 DIAGNOSIS — I10 Essential (primary) hypertension: Secondary | ICD-10-CM | POA: Diagnosis not present

## 2018-12-25 DIAGNOSIS — J90 Pleural effusion, not elsewhere classified: Secondary | ICD-10-CM | POA: Diagnosis not present

## 2018-12-25 DIAGNOSIS — R110 Nausea: Secondary | ICD-10-CM | POA: Insufficient documentation

## 2018-12-25 DIAGNOSIS — I447 Left bundle-branch block, unspecified: Secondary | ICD-10-CM | POA: Diagnosis not present

## 2018-12-25 DIAGNOSIS — R0902 Hypoxemia: Secondary | ICD-10-CM | POA: Diagnosis not present

## 2018-12-25 LAB — CBC WITH DIFFERENTIAL/PLATELET
Abs Immature Granulocytes: 0.02 10*3/uL (ref 0.00–0.07)
Basophils Absolute: 0 10*3/uL (ref 0.0–0.1)
Basophils Relative: 0 %
Eosinophils Absolute: 0 10*3/uL (ref 0.0–0.5)
Eosinophils Relative: 0 %
HCT: 36.7 % (ref 36.0–46.0)
Hemoglobin: 12.1 g/dL (ref 12.0–15.0)
Immature Granulocytes: 0 %
Lymphocytes Relative: 20 %
Lymphs Abs: 0.9 10*3/uL (ref 0.7–4.0)
MCH: 33.1 pg (ref 26.0–34.0)
MCHC: 33 g/dL (ref 30.0–36.0)
MCV: 100.3 fL — ABNORMAL HIGH (ref 80.0–100.0)
Monocytes Absolute: 0.5 10*3/uL (ref 0.1–1.0)
Monocytes Relative: 11 %
Neutro Abs: 3.1 10*3/uL (ref 1.7–7.7)
Neutrophils Relative %: 69 %
Platelets: 184 10*3/uL (ref 150–400)
RBC: 3.66 MIL/uL — ABNORMAL LOW (ref 3.87–5.11)
RDW: 16.5 % — ABNORMAL HIGH (ref 11.5–15.5)
WBC: 4.6 10*3/uL (ref 4.0–10.5)
nRBC: 0 % (ref 0.0–0.2)

## 2018-12-25 LAB — BASIC METABOLIC PANEL
Anion gap: 13 (ref 5–15)
BUN: 20 mg/dL (ref 8–23)
CO2: 25 mmol/L (ref 22–32)
Calcium: 8.9 mg/dL (ref 8.9–10.3)
Chloride: 101 mmol/L (ref 98–111)
Creatinine, Ser: 1.09 mg/dL — ABNORMAL HIGH (ref 0.44–1.00)
GFR calc Af Amer: 53 mL/min — ABNORMAL LOW (ref >60)
GFR calc non Af Amer: 46 mL/min — ABNORMAL LOW (ref >60)
Glucose, Bld: 126 mg/dL — ABNORMAL HIGH (ref 70–99)
Potassium: 3.6 mmol/L (ref 3.5–5.1)
Sodium: 139 mmol/L (ref 135–145)

## 2018-12-25 LAB — HEPATIC FUNCTION PANEL
ALT: 37 U/L (ref 0–44)
AST: 37 U/L (ref 15–41)
Albumin: 3.2 g/dL — ABNORMAL LOW (ref 3.5–5.0)
Alkaline Phosphatase: 108 U/L (ref 38–126)
Bilirubin, Direct: 0.7 mg/dL — ABNORMAL HIGH (ref 0.0–0.2)
Indirect Bilirubin: 1.9 mg/dL — ABNORMAL HIGH (ref 0.3–0.9)
Total Bilirubin: 2.6 mg/dL — ABNORMAL HIGH (ref 0.3–1.2)
Total Protein: 5.9 g/dL — ABNORMAL LOW (ref 6.5–8.1)

## 2018-12-25 LAB — CBG MONITORING, ED: Glucose-Capillary: 125 mg/dL — ABNORMAL HIGH (ref 70–99)

## 2018-12-25 LAB — BRAIN NATRIURETIC PEPTIDE: B Natriuretic Peptide: >4500 pg/mL — ABNORMAL HIGH (ref 0.0–100.0)

## 2018-12-25 LAB — TROPONIN I (HIGH SENSITIVITY): Troponin I (High Sensitivity): 15 ng/L (ref <18)

## 2018-12-25 LAB — LIPASE, BLOOD: Lipase: 28 U/L (ref 11–51)

## 2018-12-25 MED ORDER — ONDANSETRON HCL 4 MG/2ML IJ SOLN: 4.0000 mg | Freq: Once | INTRAMUSCULAR | Status: DC

## 2018-12-25 MED ORDER — FUROSEMIDE 10 MG/ML IJ SOLN: 60.0000 mg | Freq: Once | INTRAMUSCULAR | Status: DC

## 2018-12-25 NOTE — Discharge Instructions (Addendum)
Increase torsemide 40 mg twice a day as discussed.  Follow-up with your primary care doctor/cardiology early next week.  Please return to the emergency department if symptoms worsen.

## 2018-12-25 NOTE — ED Notes (Addendum)
Pt denies nausea at this time.

## 2018-12-25 NOTE — ED Provider Notes (Signed)
[facility] EMERGENCY DEPARTMENT Provider Note   CSN: [id number] Arrival date & time: 12/25/18  1709     History   Chief Complaint Chief Complaint  Patient presents with   Nausea    HPI Haley Costa is a 85 y.o. female.     Patient with history of high cholesterol, heart failure, breast cancer, stroke who presents the ED with generalized weakness, chronic nausea.  Patient with no specific complaints.  Just feels generally weak over the last several weeks.  Poor p.o. intake.  Denies any specific chest pain, shortness of breath.  Has had nausea but not much vomiting.  No abdominal pain.  No urinary symptoms.  Recently switched from Lasix to torsemide.  Is on blood thinner for atrial fibrillation.  The history is provided by the patient.  Illness Location:  General Severity:  Mild Onset quality:  Gradual Timing:  Intermittent Progression:  Waxing and waning Chronicity:  New Relieved by:  Nothing Worsened by:  Nothing Associated symptoms: nausea   Associated symptoms: no abdominal pain, no chest pain, no cough, no ear pain, no fever, no rash, no shortness of breath, no sore throat and no vomiting     Past Medical History:  Diagnosis Date   Breast cancer, left breast ([place]) 1992   S/P mastectomy & chemo   CHF (congestive heart failure) (HCC)    Chronic back pain    "lower and middle back; I've had several broken vertebrae" (12/09/2017)   CVA (cerebral vascular accident) ([place]) 02/2018   Heart murmur    High cholesterol    History of kidney stones    Hypertension    LBBB (left bundle branch block)    Patient states that she was told by her PCP that she has left bundle blockage   Osteoarthritis    "some of the joints" (12/09/2017)   Pneumonia    "when I was a child" (12/09/2017)   PONV (postoperative nausea and vomiting)     Patient Active Problem List   Diagnosis Date Noted   Cardiomyopathy ([place]) 08/17/2018   Anticoagulated  08/17/2018   LBBB (left bundle branch block) 08/17/2018   Palliative care encounter 06/08/2018   Fecal impaction ([place]) 04/26/2018   Constipation 04/25/2018   Hyperlipidemia LDL goal <70 03/03/2018   Posterior cerebral aneurysm, 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   History of stroke 02/28/2018   Acute ischemic stroke ([place]), s/p mechanical throbmectomy 02/28/2018   Middle cerebral artery embolism, 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   AF (paroxysmal atrial fibrillation) (HCC)    Acute on chronic respiratory failure (HCC)    Hyponatremia 12/09/2017   Acute exacerbation of CHF (congestive heart failure) ([place]) 11/22/2017   Chest pain [id number]   Chronic systolic heart failure ([place]) 11/21/2017   HTN (hypertension) 11/21/2017    Past Surgical History:  Procedure Laterality Date   APPENDECTOMY     BACK SURGERY     CATARACT EXTRACTION W/ INTRAOCULAR LENS IMPLANT Right    FIXATION KYPHOPLASTY THORACIC SPINE  2014   IR CT HEAD LTD  02/28/2018   IR PERCUTANEOUS ART THROMBECTOMY/INFUSION INTRACRANIAL INC DIAG ANGIO  02/28/2018   MASTECTOMY Left 1992   RADIOLOGY WITH ANESTHESIA N/A 02/28/2018   Procedure: RADIOLOGY WITH ANESTHESIA;  Surgeon: [name], MD;  Location: [facility];  Service: Radiology;  Laterality: N/A;     OB History   No obstetric history on file.      Home Medications    Prior to Admission medications   Medication Sig Start Date End  Date Taking? Authorizing Provider  apixaban (ELIQUIS) 2.5 MG TABS tablet Take 2.5 mg by mouth 2 (two) times daily.    [provider]  carvedilol (COREG) 6.25 MG tablet Take 0.5 tablets (3.125 mg total) by mouth 2 (two) times daily with a meal. 05/13/18   [name], [name], MD  nitroGLYCERIN (NITROSTAT) 0.4 MG SL tablet DISSOLVE ONE TABLET UNDER THE TONGUE EVERY 5 MINUTES AS NEEDED FOR CHEST PAIN.  DO NOT EXCEED A TOTAL OF 3 DOSES IN 15 MINUTES 11/04/18   [name], MD  ondansetron (ZOFRAN) 4 MG tablet Take 1 tablet by mouth every  4 (four) hours as needed. 06/15/18   [provider]  polyethylene glycol (MIRALAX / GLYCOLAX) packet Take 17 g by mouth 2 (two) times daily. 04/26/18   Mariel Aloe, MD  potassium chloride 20 MEQ TBCR Take 20 mEq by mouth daily. 12/10/17   [name], [name], MD  torsemide (DEMADEX) 20 MG tablet Take 40 mg (2 tab) by mouth every morning and 20 mg (1 tab) every evening. Goal weight is 138-142 lbs. When this is reached, start taking 40 mg once daily, extra 20 mg as needed. 12/01/18   Barrett, Evelene Croon, PA-C    Family History Family History  Problem Relation Age of Onset   Cervical cancer Mother    Hypertension Brother     Social History Social History   Tobacco Use   Smoking status: Never Smoker   Smokeless tobacco: Never Used  Substance Use Topics   Alcohol use: Never    Frequency: Never   Drug use: Never     Allergies   Chlorthalidone, Lisinopril, Persantine [dipyridamole], and Spironolactone   Review of Systems Review of Systems  Constitutional: Negative for chills and fever.  HENT: Negative for ear pain and sore throat.   Eyes: Negative for pain and visual disturbance.  Respiratory: Negative for cough and shortness of breath.   Cardiovascular: Negative for chest pain and palpitations.  Gastrointestinal: Positive for nausea. Negative for abdominal pain and vomiting.  Genitourinary: Negative for dysuria and hematuria.  Musculoskeletal: Negative for arthralgias and back pain.  Skin: Negative for color change and rash.  Neurological: Positive for weakness. Negative for seizures and syncope.  All other systems reviewed and are negative.    Physical Exam Updated Vital Signs  ED Triage Vitals  Enc Vitals Group     BP 12/25/18 1710 126/88     Pulse Rate 12/25/18 1710 86     Resp 12/25/18 1710 16     Temp 12/25/18 1710 98.5 F (36.9 C)     Temp Source 12/25/18 1710 Oral     SpO2 12/25/18 1710 100 %     Weight 12/25/18 1724 135 lb (61.2 kg)     Height  12/25/18 1724 5\' 1"  (1.549 m)     Head Circumference --      Peak Flow --      Pain Score --      Pain Loc --      Pain Edu? --      Excl. in [place]? --     Physical Exam Vitals signs and nursing note reviewed.  Constitutional:      General: She is not in acute distress.    Appearance: She is well-developed. She is not ill-appearing.  HENT:     Head: Normocephalic and atraumatic.     Nose: Nose normal.     Mouth/Throat:     Mouth: Mucous membranes are moist.  Eyes:     Extraocular Movements: Extraocular movements intact.     Conjunctiva/sclera: Conjunctivae normal.     Pupils: Pupils are equal, round, and reactive to light.  Neck:     Musculoskeletal: Neck supple.  Cardiovascular:     Rate and Rhythm: Normal rate and regular rhythm.     Pulses: Normal pulses.     Heart sounds: Normal heart sounds. No murmur.  Pulmonary:     Effort: Pulmonary effort is normal. No respiratory distress.     Breath sounds: Rales present.  Abdominal:     General: Abdomen is flat. There is no distension.     Palpations: Abdomen is soft.     Tenderness: There is no abdominal tenderness. There is no guarding.     Hernia: No hernia is present.  Musculoskeletal:     Right lower leg: Edema (2+) present.     Left lower leg: Edema (2+) present.  Skin:    General: Skin is warm and dry.  Neurological:     General: No focal deficit present.     Mental Status: She is alert and oriented to person, place, and time.     Cranial Nerves: No cranial nerve deficit.     Sensory: No sensory deficit.     Motor: No weakness.     Coordination: Coordination normal.  Psychiatric:        Mood and Affect: Mood normal.      ED Treatments / Results  Labs (all labs ordered are listed, but only abnormal results are displayed) Labs Reviewed  CBC WITH DIFFERENTIAL/PLATELET - Abnormal; Notable for the following components:      Result Value   RBC 3.66 (*)    MCV 100.3 (*)    RDW 16.5 (*)    All other components  within normal limits  BASIC METABOLIC PANEL - Abnormal; Notable for the following components:   Glucose, Bld 126 (*)    Creatinine, Ser 1.09 (*)    GFR calc non Af Amer 46 (*)    GFR calc Af Amer 53 (*)    All other components within normal limits  HEPATIC FUNCTION PANEL - Abnormal; Notable for the following components:   Total Protein 5.9 (*)    Albumin 3.2 (*)    Total Bilirubin 2.6 (*)    Bilirubin, Direct 0.7 (*)    Indirect Bilirubin 1.9 (*)    All other components within normal limits  BRAIN NATRIURETIC PEPTIDE - Abnormal; Notable for the following components:   B Natriuretic Peptide >4,500.0 (*)    All other components within normal limits  CBG MONITORING, ED - Abnormal; Notable for the following components:   Glucose-Capillary 125 (*)    All other components within normal limits  LIPASE, BLOOD  TROPONIN I (HIGH SENSITIVITY)    EKG EKG Interpretation  Date/Time:  Friday December 25 2018 17:42:42 EDT Ventricular Rate:  80 PR Interval:    QRS Duration: 132 QT Interval:  442 QTC Calculation: 510 R Axis:   -69 Text Interpretation:  Atrial fibrillation Left bundle branch block Baseline wander in lead(s) III aVL Confirmed by Lennice Sites [phone]) on 12/25/2018 5:54:58 PM   Radiology Dg Chest Portable 1 View  Result Date: 12/25/2018 CLINICAL DATA:  Weakness. EXAM: PORTABLE CHEST 1 VIEW COMPARISON:  Chest x-ray dated March 21, 2018. FINDINGS: The patient is rotated to the right. Stable cardiomegaly. Normal pulmonary vascularity. Small to moderate layering bilateral pleural effusions with adjacent bibasilar opacities. No pneumothorax. No acute osseous abnormality.  Unchanged surgical clips in the left axilla. IMPRESSION: 1. Small to moderate layering bilateral pleural effusions with adjacent bibasilar atelectasis and/or infiltrates. Electronically Signed   By: Titus Dubin M.D.   On: 12/25/2018 18:32    Procedures Procedures (including critical care time)  Medications  Ordered in ED Medications  ondansetron (ZOFRAN) injection 4 mg (4 mg Intravenous Refused 12/25/18 1915)     Initial Impression / Assessment and Plan / ED Course  I have reviewed the triage vital signs and the nursing notes.  Pertinent labs & imaging results that were available during my care of the patient were reviewed by me and considered in my medical decision making (see chart for details).     Haley Costa is an 85 year old female history of hypertension, high cholesterol, atrial fibrillation on Eliquis, stroke who presents to the ED with nausea, generalized weakness that is been ongoing for several weeks.  Patient with normal vitals.  No fever.  Overall no specific complaints except for generalized weakness, poor p.o. intake, intermittent nausea.  Denies any chest pain, shortness of breath, abdominal pain, diarrhea.  Recently switched from Lasix to torsemide.  Feels as if she is breathing the way she normally does.  Vital signs are unremarkable.  No signs of increased work of breathing.  Has some mild edema in her legs bilaterally.  No abdominal tenderness.  Overall will do some screening lab work including EKG, urinalysis, chest x-ray.  Will give Zofran for nausea.  Multiple risk factors for silent disease however possibly this is from failure to thrive.  Will evaluate with lab work and reevaluate and talk with family.  Chest x-ray with signs of volume overload.  BNP elevated to 4500.  However troponin within normal limits.  No significant electrolyte abnormality, kidney injury.  Bilirubin at baseline.  Does not have any abdominal tenderness.  Lipase is normal.  Doubt any intra-abdominal process.  Likely patient feeling weak due to volume overload.  Recently was told to take torsemide once daily but used to be on Lasix twice a day.  She has a dry weight that she has as a target which she states that she is at but suspect likely that most of her weight at this time is from water.  She does  have some poor p.o. intake.  Encourage admission to the patient however she prefers to leave [place] and to double her dose of torsemide at home and follow-up closely with cardiology.  Patient does not have any respiratory distress.  She does not have any hypoxia.  Overall she appears well.  However believe she would benefit from closer IV diuretics while inpatient but at this time patient has made the decision to try outpatient treatment.  She has capacity to make this decision.  Family member was at the bedside during this discussion as well and will monitor the patient at home.  Patient was discharged from the ED in good condition.  Understands return precautions.  This chart was dictated using voice recognition software.  Despite best efforts to proofread,  errors can occur which can change the documentation meaning.   This chart was dictated using voice recognition software.  Despite best efforts to proofread,  errors can occur which can change the documentation meaning.    Final Clinical Impressions(s) / ED Diagnoses   Final diagnoses:  Nausea  Hypervolemia, unspecified hypervolemia type    ED Discharge Orders    None       Lennice Sites, DO 12/25/18  2054 ° °

## 2018-12-25 NOTE — ED Triage Notes (Signed)
The pt  Arrived by [facility] from home where she lives alone  She is c/o  Nausea for many months  Worse for 3 weeks lt arm restricted for mastectomy.  Alert oriented

## 2018-12-28 ENCOUNTER — Telehealth: Payer: Self-pay | Admitting: Cardiology

## 2018-12-28 DIAGNOSIS — Z79899 Other long term (current) drug therapy: Secondary | ICD-10-CM

## 2018-12-28 NOTE — Telephone Encounter (Signed)
° ° °  Patient granddaughter calling to report nausea  1) How much weight have you gained and in what time span?   2) If swelling, where is the swelling located? Abdomen, legs  3) Are you currently taking a fluid pill? yes  Are you currently SOB? SOB when walking around  4) Do you have a log of your daily weights (if so, list)? 135lbs  5) Have you gained 3 pounds in a day or 5 pounds in a week?   6) Have you traveled recently? no

## 2018-12-28 NOTE — Telephone Encounter (Signed)
Change demadex to 40 BID; bmet one week Kirk Ruths

## 2018-12-28 NOTE — Telephone Encounter (Signed)
Returned call to [facility]. Patient went to ED on 10/9 afternoon. BNP was >4500. Patient was advised to be admitted but refused. [name] reports fluid is in abdomen and around lungs. Patient has mobility issues, can only travel via wheelchair so has difficulty going to doctor but patient has a palliative care practitioner, scheduled to come this week. [name] reports patient had been losing weight d/t fluid however patient is not eating well d/t nausea and had gotten down to 135lbs so was only take torsemide 40mg  QD as directed per [name] (med instructions below) however [name] reports the weight loss was from nausea/diet and not fluid. Patient is very winded when walking around the house. Patient reports she felt like lasix was doing better than torsemide. Legs swollen, but doesn't elevate legs as advised per [name].  Patient is now on 80mg  daily of torsemide per ED visit   [name] would like MD to review and advise if patient should continue torsemide, at what dose, or change to lasix since patient reported this worked better for her?  torsemide (DEMADEX) 20 MG tablet 60 tablet 3 12/01/2018    Sig: Take 40 mg (2 tab) by mouth every morning and 20 mg (1 tab) every evening. Goal weight is 138-142 lbs. When this is reached, start taking 40 mg once daily, extra 20 mg as needed.   Sent to pharmacy as: torsemide (DEMADEX) 20 MG tablet   E-Prescribing Status: Receipt confirmed by pharmacy (12/01/2018 4:20 PM EDT)

## 2018-12-28 NOTE — Telephone Encounter (Signed)
Change to 60 mg in AM and 40 in PM; bmet one week [place]

## 2018-12-28 NOTE — Telephone Encounter (Signed)
Home Visit Request  Requesting provider:   [name] MD  Please specify if there is a specific agency the provider is requesting:   [facility] (Labs, IV Lasix)  Reason for home visit:   Lab work - week of 01/04/2019  Is this the initial request or a follow up?:   Follow Up Request  Services requested:   Labs:  BMET  When services are needed:   Week of 10/19/20202  # of visits/frequency requested:   1 - lab work  Fidel Levy, RN  12/28/2018 3:42 PM

## 2018-12-30 ENCOUNTER — Other Ambulatory Visit: Payer: Self-pay

## 2018-12-30 ENCOUNTER — Other Ambulatory Visit: Payer: Medicare Other | Admitting: Internal Medicine

## 2019-01-05 NOTE — Telephone Encounter (Signed)
I will route HH orders to Chrissie Noa as well as Glory Buff, NP.

## 2019-01-05 NOTE — Addendum Note (Signed)
Addended by: Michae Kava on: 01/05/2019 01:10 PM   Modules accepted: Orders

## 2019-01-07 ENCOUNTER — Telehealth: Payer: Self-pay | Admitting: Cardiology

## 2019-01-07 NOTE — Telephone Encounter (Signed)
Spoke with pt granddaughter, her goal weight is 138 lb and she is now at 127 lb. She is taking torsemide 60 mg in the morning and 40 mg in the afternoon. Her swelling is completely gone and her bp last night was 117/67. She continues to have nausea all the time, she is unable to take zofran. Will forward for dr Stanford Breed review

## 2019-01-07 NOTE — Telephone Encounter (Signed)
Patients granddaughter states that Dr. Stanford Breed upped her torsemide (DEMADEX) 20 MG tablet due to swelling. She states that the patient has lost weight about 10 pounds and no longer has swelling issues. She would like to know if the patient needs to stop her lasix or go back the original dosage.

## 2019-01-07 NOTE — Telephone Encounter (Signed)
Will route to MD to advise. Thanks!

## 2019-01-08 DIAGNOSIS — Z79899 Other long term (current) drug therapy: Secondary | ICD-10-CM | POA: Diagnosis not present

## 2019-01-08 NOTE — Telephone Encounter (Signed)
Left message for pt granddaughter to call  

## 2019-01-08 NOTE — Telephone Encounter (Signed)
Hold demadex for 1 day and then change to 40 mg daily with additional 20 mg for weight of 140 or higher; check bmet Kirk Ruths

## 2019-01-08 NOTE — Telephone Encounter (Signed)
Spoke with pt granddaughter, Aware of dr Jacalyn Lefevre recommendations. The person is coming out today to get lab work.

## 2019-01-09 LAB — BASIC METABOLIC PANEL
BUN/Creatinine Ratio: 29 — ABNORMAL HIGH (ref 12–28)
BUN: 30 mg/dL — ABNORMAL HIGH (ref 8–27)
CO2: 22 mmol/L (ref 20–29)
Calcium: 9 mg/dL (ref 8.7–10.3)
Chloride: 97 mmol/L (ref 96–106)
Creatinine, Ser: 1.03 mg/dL — ABNORMAL HIGH (ref 0.57–1.00)
GFR calc Af Amer: 56 mL/min/{1.73_m2} — ABNORMAL LOW (ref >59)
GFR calc non Af Amer: 49 mL/min/{1.73_m2} — ABNORMAL LOW (ref >59)
Glucose: 114 mg/dL — ABNORMAL HIGH (ref 65–99)
Potassium: 4.5 mmol/L (ref 3.5–5.2)
Sodium: 135 mmol/L (ref 134–144)

## 2019-01-10 NOTE — Progress Notes (Signed)
Home Visit on behalf of [facility] to draw a BMET from Haley Costa.  She stated she was having nausea, which is not unusual, but wasn't having any other acute issues.  She did have BLE swelling, but she stated it wasn't any more than usual.  She stated she'd elevate her legs upon my leaving.   BMET was drawn and brought to the [facility] on [address].

## 2019-01-25 NOTE — Progress Notes (Deleted)
HPI: Follow-up congestive heart failure. Patient admitted with chest pain and CHF in September 2019. Echocardiogram showed ejection fraction 25%. Cardiac catheterization was discussed with patient but she declined and preferred medical therapy. She was placed on Entresto and spironolactone but developed hypotension resulting in syncope. Had CVA December 2019. Patient had thrombectomy of right M1 occlusion. She was found to have atrial fibrillation on telemetry and Eliquis was initiated. Echocardiogram repeated February 2020 and showed ejection fraction 20%. Right ventricular function was moderately reduced and there was moderate pulmonary hypertension. There was severe biatrial enlargement, severe mitral regurgitation and moderate tricuspid regurgitation. Chest x-ray October 2020 showed small to moderate bilateral pleural effusions. Since last seen patient   Current Outpatient Medications  Medication Sig Dispense Refill  . apixaban (ELIQUIS) 2.5 MG TABS tablet Take 2.5 mg by mouth 2 (two) times daily.    . carvedilol (COREG) 6.25 MG tablet Take 0.5 tablets (3.125 mg total) by mouth 2 (two) times daily with a meal. 180 tablet 1  . nitroGLYCERIN (NITROSTAT) 0.4 MG SL tablet DISSOLVE ONE TABLET UNDER THE TONGUE EVERY 5 MINUTES AS NEEDED FOR CHEST PAIN.  DO NOT EXCEED A TOTAL OF 3 DOSES IN 15 MINUTES 75 tablet 2  . ondansetron (ZOFRAN) 4 MG tablet Take 1 tablet by mouth every 4 (four) hours as needed.    . polyethylene glycol (MIRALAX / GLYCOLAX) packet Take 17 g by mouth 2 (two) times daily. 14 each 0  . potassium chloride 20 MEQ TBCR Take 20 mEq by mouth daily. 30 tablet 0  . torsemide (DEMADEX) 20 MG tablet Take 40 mg by mouth daily.     No current facility-administered medications for this visit.      Past Medical History:  Diagnosis Date  . Breast cancer, left breast ([place]) 1992   S/P mastectomy & chemo  . CHF (congestive heart failure) ([place])   . Chronic back pain    "lower  and middle back; I've had several broken vertebrae" (12/09/2017)  . CVA (cerebral vascular accident) ([place]) 02/2018  . Heart murmur   . High cholesterol   . History of kidney stones   . Hypertension   . LBBB (left bundle branch block)    Patient states that she was told by her PCP that she has left bundle blockage  . Osteoarthritis    "some of the joints" (12/09/2017)  . Pneumonia    "when I was a child" (12/09/2017)  . PONV (postoperative nausea and vomiting)     Past Surgical History:  Procedure Laterality Date  . APPENDECTOMY    . BACK SURGERY    . CATARACT EXTRACTION W/ INTRAOCULAR LENS IMPLANT Right   . FIXATION KYPHOPLASTY THORACIC SPINE  2014  . IR CT HEAD LTD  02/28/2018  . IR PERCUTANEOUS ART THROMBECTOMY/INFUSION INTRACRANIAL INC DIAG ANGIO  02/28/2018  . MASTECTOMY Left 1992  . RADIOLOGY WITH ANESTHESIA N/A 02/28/2018   Procedure: RADIOLOGY WITH ANESTHESIA;  Surgeon: [name], MD;  Location: [facility];  Service: Radiology;  Laterality: N/A;    Social History   Socioeconomic History  . Marital status: Widowed    Spouse name: Not on file  . Number of children: Not on file  . Years of education: Not on file  . Highest education level: Not on file  Occupational History  . Not on file  Social Needs  . Financial resource strain: Not on file  . Food insecurity    Worry: Not on file  Inability: Not on file  . Transportation needs    Medical: Not on file    Non-medical: Not on file  Tobacco Use  . Smoking status: Never Smoker  . Smokeless tobacco: Never Used  Substance and Sexual Activity  . Alcohol use: Never    Frequency: Never  . Drug use: Never  . Sexual activity: Not on file  Lifestyle  . Physical activity    Days per week: Not on file    Minutes per session: Not on file  . Stress: Not on file  Relationships  . Social Herbalist on phone: Not on file    Gets together: Not on file    Attends religious service: Not on file    Active  member of club or organization: Not on file    Attends meetings of clubs or organizations: Not on file    Relationship status: Not on file  . Intimate partner violence    Fear of current or ex partner: Not on file    Emotionally abused: Not on file    Physically abused: Not on file    Forced sexual activity: Not on file  Other Topics Concern  . Not on file  Social History Narrative  . Not on file    Family History  Problem Relation Age of Onset  . Cervical cancer Mother   . Hypertension Brother     ROS: no fevers or chills, productive cough, hemoptysis, dysphasia, odynophagia, melena, hematochezia, dysuria, hematuria, rash, seizure activity, orthopnea, PND, pedal edema, claudication. Remaining systems are negative.  Physical Exam: Well-developed well-nourished in no acute distress.  Skin is warm and dry.  HEENT is normal.  Neck is supple.  Chest is clear to auscultation with normal expansion.  Cardiovascular exam is regular rate and rhythm.  Abdominal exam nontender or distended. No masses palpated. Extremities show no edema. neuro grossly intact  ECG- personally reviewed  A/P  1 chronic systolic congestive heart failure-plan to continue demadex.  Continue fluid restriction and low-sodium diet.  Check potassium and renal function.  2 cardiomyopathy-as outlined in previous notes patient has requested only medical therapy and declines aggressive evaluation including cardiac catheterization.  Patient did not tolerate Entresto because of hypotension.  Continue ARB and beta-blocker.  3 paroxysmal atrial fibrillation-continue beta-blocker for rate control if atrial fibrillation recurs.  Continue apixaban.  4 hypertension-patient's blood pressure is controlled.  Continue present medications and follow.  5 hyperlipidemia-followed by primary care.  6 severe mitral regurgitation-patient has requested only medical therapy.  Kirk Ruths, MD

## 2019-02-01 ENCOUNTER — Ambulatory Visit: Payer: Medicare Other | Admitting: Cardiology

## 2019-02-24 ENCOUNTER — Other Ambulatory Visit: Payer: Medicare Other | Admitting: Internal Medicine

## 2019-02-25 ENCOUNTER — Telehealth: Payer: Self-pay | Admitting: Cardiology

## 2019-02-25 NOTE — Telephone Encounter (Signed)
New Message  NP Gonzella Lex is calling in to speak with Dr. Stanford Breed or his nurse. States that there is discussion about hospice and would like to know if patient is eligible for hospice. Please give NP Cated a call back at [phone].

## 2019-02-26 ENCOUNTER — Telehealth: Payer: Self-pay | Admitting: Cardiology

## 2019-02-26 ENCOUNTER — Other Ambulatory Visit: Payer: Self-pay | Admitting: *Deleted

## 2019-02-26 ENCOUNTER — Encounter: Payer: Self-pay | Admitting: *Deleted

## 2019-02-26 ENCOUNTER — Telehealth: Payer: Self-pay | Admitting: *Deleted

## 2019-02-26 DIAGNOSIS — Z79899 Other long term (current) drug therapy: Secondary | ICD-10-CM

## 2019-02-26 DIAGNOSIS — I5023 Acute on chronic systolic (congestive) heart failure: Secondary | ICD-10-CM

## 2019-02-26 MED ORDER — TORSEMIDE 20 MG PO TABS: ORAL_TABLET | ORAL | 3 refills | Status: AC

## 2019-02-26 NOTE — Telephone Encounter (Signed)
Home Visit Request  Requesting provider:   Dr. Stanford Breed / Fredia Beets, RN  Please specify if there is a specific agency the provider is requesting:   [facility] (Labs, IV Lasix)  Reason for home visit:   Lab work  Is this the initial request or a follow up?:   Initial Request  Services requested:   Labs:  BMP  When services are needed:   NEXT WEEK  # of visits/frequency requested:   1  Jeanann Lewandowsky, [place]  02/26/2019 11:50 AM

## 2019-02-26 NOTE — Telephone Encounter (Signed)
New message   Patient's granddaughter is calling to see if someone can order the blood work to be done at home. Please call to discuss.

## 2019-02-26 NOTE — Telephone Encounter (Signed)
Spoke with pt granddaughter, aware will arrange remote health to draw blood work for [place].

## 2019-02-26 NOTE — Telephone Encounter (Signed)
Left message for Haley Costa, okay for hospice if needed.

## 2019-02-26 NOTE — Telephone Encounter (Signed)
This encounter was created in error - please disregard.

## 2019-03-04 ENCOUNTER — Telehealth: Payer: Self-pay

## 2019-03-06 LAB — BASIC METABOLIC PANEL
BUN/Creatinine Ratio: 31 — ABNORMAL HIGH (ref 12–28)
BUN: 28 mg/dL — ABNORMAL HIGH (ref 8–27)
CO2: 23 mmol/L (ref 20–29)
Calcium: 8.8 mg/dL (ref 8.7–10.3)
Chloride: 100 mmol/L (ref 96–106)
Creatinine, Ser: 0.89 mg/dL (ref 0.57–1.00)
GFR calc Af Amer: 67 mL/min/{1.73_m2} (ref >59)
GFR calc non Af Amer: 58 mL/min/{1.73_m2} — ABNORMAL LOW (ref >59)
Glucose: 82 mg/dL (ref 65–99)
Potassium: 3.8 mmol/L (ref 3.5–5.2)
Sodium: 140 mmol/L (ref 134–144)
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
## 2019-03-08 ENCOUNTER — Telehealth: Payer: Self-pay | Admitting: *Deleted

## 2019-03-08 MED ORDER — APIXABAN 5 MG PO TABS: 5.0000 mg | ORAL_TABLET | Freq: Two times a day (BID) | ORAL | 6 refills | Status: DC

## 2019-03-08 MED ORDER — APIXABAN 5 MG PO TABS: 5.0000 mg | ORAL_TABLET | Freq: Two times a day (BID) | ORAL | 6 refills | Status: AC

## 2019-03-08 NOTE — Telephone Encounter (Signed)
-----   Message from [name], MD sent at 03/08/2019  7:43 AM EST ----- Apixaban dose should be 5 mg BID Kirk Ruths

## 2019-03-08 NOTE — Telephone Encounter (Signed)
Left detailed message for granddaughter regarding the increase in eliquis. New script sent to the pharmacy

## 2019-03-31 ENCOUNTER — Other Ambulatory Visit: Payer: Medicare Other | Admitting: Internal Medicine

## 2019-03-31 ENCOUNTER — Other Ambulatory Visit: Payer: Self-pay

## 2019-03-31 ENCOUNTER — Telehealth: Payer: Self-pay | Admitting: Cardiology

## 2019-03-31 DIAGNOSIS — Z515 Encounter for palliative care: Secondary | ICD-10-CM

## 2019-03-31 DIAGNOSIS — I5022 Chronic systolic (congestive) heart failure: Secondary | ICD-10-CM

## 2019-03-31 NOTE — Telephone Encounter (Signed)
Spoke with shirley, she has seen the patient today and reports the patient is SOB and it is interrupting her speech. Her breath sounds are diminished in both lungs, she has abdominal distension and 2+ edema in both legs. bp 120/60 and sat 96%. She fell against the fireplace and is c/o stabbing pain in the left rib cage area. The patient reports she took 3 Demadex this morning and is planning on taking another 1 this afternoon. She is weak and unable to stand and is confused with details but is oriented. She does not want to go to the hospital and they are asking if hospice is appropriate at this time. Discussed with dr Stanford Breed, okay to give 2 demadex this afternoon. Hospice is fine but would like the medical doctor to be the primary. shirley made aware.

## 2019-03-31 NOTE — Progress Notes (Addendum)
[facility] Consult Note Telephone: [phone]  Fax: [phone]  PATIENT NAME: Haley Costa DOB: [birth year] MRN: [id number]  PRIMARY CARE PROVIDER:   [name], DO  REFERRING PROVIDER:  [name], DO [place],  [place] [address]  RESPONSIBLE PARTY:   Self and granddaughter      RECOMMENDATIONS and PLAN:  Palliative Care Encounter Z51.5  1.  Advance care planning:  Directives remain as DNAR/DNI without return to hospital.  Patient appears to be nearing end of life. Discussion with patient and family in attendance that with her desire to not persue advanced therapies and/or go to the hospital for treatments, she is eligible to transition to Hospice care at home after discussion with Drs Stanford Breed and [facility].  Will inform Dr. Theda Sers of patient status as well. Plan on additional communication with patient and grand daughter/HCPOA for instructions and additional recomendations.   Addendum:  Phone conversation with nurse of Dr. [name] to inform her of pt. Status.  She stated that Dr. Theda Sers agreed with plan to transition to [facility] and she will be patient's attending of record.  Verbal hospice order was sent to the referral center.  They will contact grand daughter/POA for appointment setup. I phoned Nikki(granddaughter) to inform her of this plan.  She stated that Mrs. Sandhu and she agree with this plan.  She thanked me for this information.   2.  Shortness of breath:  Exacerbated as related to advanced CHF/fluid overload. More frequent episodes of fluid overload without desire for advanced therapies. Consider initiation of Morphine concentrate low dose to ease air hunger.  Per phone conversation with Dr. Stanford Breed via nurse Neoma Laming, recommendations are for use of 2 Torsemide this afternoon rather than her normal 1 tablet. Of Torsemide.  3. Lateral left chest pain:  Probable trauma to  chest wall with upright fall into fireplace and coughing.  Would consider rib fractures but not confirmed without CXR.  Splint L chest with pillow while coughing. Tylenol 500mg  tabs as needed for mild pain. If opiods are initiated related to dyspnea, this would also address significant rib pain.  4.  Weakness:  Multifactorial as related to additional decline of heart failure with fluid retention. Increase supportive care and physical assistance with ambulation.  Fall risk prevention reviewed.  I spent 90 minutes providing this consultation,  from 1330 to 1500. More than 50% of the time in this consultation was spent coordinating communication with patient, granddaughter/POA and son.   HISTORY OF PRESENT ILLNESS: Follow-up Sydney R Wachob. She reports increased shortness of breath, wheezing, weakness.  She reports being unable to weigh due to weakness, shortness of breath and difficulty ambulating even with use of rollator. Last known weight was 139-140#.  She adamantly states that she will not be going to the hospital. Unable to leave home for an office appointment. New onset of sharp L lateral chest pain which is worsened by palpation, movement and coughing.  She and son report stumbling into her fireplace mantel and striking L rib area aprox 2 weeks ago. She still has intermittent anterior chest pain that requires use of NTG. Palliative Care was asked to help address goals of care.   CODE STATUS:  DNAR/DNI  PPS: 40% weak decreased from 50%  HOSPICE ELIGIBILITY/DIAGNOSIS: YES/ Advanced CHF with dyspnea and angina  PAST MEDICAL HISTORY:  Past Medical History:  Diagnosis Date  . Breast cancer, left breast ([place]) 1992   S/P mastectomy &  chemo  . CHF (congestive heart failure) ([place])   . Chronic back pain    "lower and middle back; I've had several broken vertebrae" (12/09/2017)  . CVA (cerebral vascular accident) ([place]) 02/2018  . Heart murmur   . High cholesterol   . History of kidney stones     . Hypertension   . LBBB (left bundle branch block)    Patient states that she was told by her PCP that she has left bundle blockage  . Osteoarthritis    "some of the joints" (12/09/2017)  . Pneumonia    "when I was a child" (12/09/2017)  . PONV (postoperative nausea and vomiting)      ALLERGIES:  Allergies  Allergen Reactions  . Chlorthalidone Swelling  . Lisinopril Swelling    Eye  And cheek swelling  . Persantine [Dipyridamole] Other (See Comments)    unknown  . Spironolactone Nausea Only     PERTINENT MEDICATIONS:  Outpatient Encounter Medications as of 03/31/2019  Medication Sig  . apixaban (ELIQUIS) 5 MG TABS tablet Take 1 tablet (5 mg total) by mouth 2 (two) times daily.  . carvedilol (COREG) 6.25 MG tablet Take 0.5 tablets (3.125 mg total) by mouth 2 (two) times daily with a meal.  . nitroGLYCERIN (NITROSTAT) 0.4 MG SL tablet DISSOLVE ONE TABLET UNDER THE TONGUE EVERY 5 MINUTES AS NEEDED FOR CHEST PAIN.  DO NOT EXCEED A TOTAL OF 3 DOSES IN 15 MINUTES  . ondansetron (ZOFRAN) 4 MG tablet Take 1 tablet by mouth every 4 (four) hours as needed.  . polyethylene glycol (MIRALAX / GLYCOLAX) packet Take 17 g by mouth 2 (two) times daily.  . potassium chloride 20 MEQ TBCR Take 20 mEq by mouth daily.  Marland Kitchen torsemide (DEMADEX) 20 MG tablet 2 TABLETS ONCE DAILY OR AS NEEDED FOR SWELLING   No facility-administered encounter medications on file as of 03/31/2019.    PHYSICAL EXAM:   BP 120/60  P 83 R24  Sats 97%  General: Not well appearing. Sitting in recliner with dyspnea at rest Cardiovascular: systolic murmur. Regular rhythm. Jugular distention Extreme point tenderness and guarding of the L lateral thorax Pulmonary: Increased work of breathing. Speech is forced.  Diminished breath sounds of all fields with fine wheezing of upper lung fields Abdomen: Active bowel sounds.  Distended GU: no suprapubic tenderness Extremities: 2+ edema Skin:  Pale in color, cool extremities.   Superficial opening of the LLE distally with active serous drainage. Neurological: A&O x 3, Mild confusion to detailed information. Very weak.  Unable to stand without assistance Psych:  Intermittently tearful.  Cooperative  Gonzella Lex, NP-C

## 2019-03-31 NOTE — Telephone Encounter (Signed)
Haley Lex, NP  Palliative Care Nurse is with the patient right now. The patient is more SOB and has L Lateral Chest/Rib pain. She has more edema.   The Palliative Care nurse can give more specific details when the office returns her call

## 2019-04-01 DIAGNOSIS — M1990 Unspecified osteoarthritis, unspecified site: Secondary | ICD-10-CM | POA: Diagnosis not present

## 2019-04-01 DIAGNOSIS — I480 Paroxysmal atrial fibrillation: Secondary | ICD-10-CM | POA: Diagnosis not present

## 2019-04-01 DIAGNOSIS — I5023 Acute on chronic systolic (congestive) heart failure: Secondary | ICD-10-CM | POA: Diagnosis not present

## 2019-04-01 DIAGNOSIS — Z853 Personal history of malignant neoplasm of breast: Secondary | ICD-10-CM | POA: Diagnosis not present

## 2019-04-01 DIAGNOSIS — I110 Hypertensive heart disease with heart failure: Secondary | ICD-10-CM | POA: Diagnosis not present

## 2019-04-01 DIAGNOSIS — I69318 Other symptoms and signs involving cognitive functions following cerebral infarction: Secondary | ICD-10-CM | POA: Diagnosis not present

## 2019-04-01 DIAGNOSIS — Z741 Need for assistance with personal care: Secondary | ICD-10-CM | POA: Diagnosis not present

## 2019-04-02 DIAGNOSIS — Z853 Personal history of malignant neoplasm of breast: Secondary | ICD-10-CM | POA: Diagnosis not present

## 2019-04-02 DIAGNOSIS — I5023 Acute on chronic systolic (congestive) heart failure: Secondary | ICD-10-CM | POA: Diagnosis not present

## 2019-04-02 DIAGNOSIS — I69318 Other symptoms and signs involving cognitive functions following cerebral infarction: Secondary | ICD-10-CM | POA: Diagnosis not present

## 2019-04-02 DIAGNOSIS — I480 Paroxysmal atrial fibrillation: Secondary | ICD-10-CM | POA: Diagnosis not present

## 2019-04-02 DIAGNOSIS — M1990 Unspecified osteoarthritis, unspecified site: Secondary | ICD-10-CM | POA: Diagnosis not present

## 2019-04-02 DIAGNOSIS — I110 Hypertensive heart disease with heart failure: Secondary | ICD-10-CM | POA: Diagnosis not present

## 2019-04-03 DIAGNOSIS — I110 Hypertensive heart disease with heart failure: Secondary | ICD-10-CM | POA: Diagnosis not present

## 2019-04-03 DIAGNOSIS — M1990 Unspecified osteoarthritis, unspecified site: Secondary | ICD-10-CM | POA: Diagnosis not present

## 2019-04-03 DIAGNOSIS — I69318 Other symptoms and signs involving cognitive functions following cerebral infarction: Secondary | ICD-10-CM | POA: Diagnosis not present

## 2019-04-03 DIAGNOSIS — I480 Paroxysmal atrial fibrillation: Secondary | ICD-10-CM | POA: Diagnosis not present

## 2019-04-03 DIAGNOSIS — Z853 Personal history of malignant neoplasm of breast: Secondary | ICD-10-CM | POA: Diagnosis not present

## 2019-04-03 DIAGNOSIS — I5023 Acute on chronic systolic (congestive) heart failure: Secondary | ICD-10-CM | POA: Diagnosis not present

## 2019-04-04 NOTE — Progress Notes (Signed)
    [facility] Consult Note Telephone: [phone]  Fax: [phone]  PATIENT NAME: Haley Costa DOB: June 17, 1928 MRN: [id number]  PRIMARY CARE PROVIDER:   [name], DO  REFERRING PROVIDER:  [name], DO [place],  [place] [address]  RESPONSIBLE PARTY:   Ranell Patrick and patient      RECOMMENDATIONS and PLAN:  Palliative care encounter Z51.5  1.  Advance care planning:  DNAR and MOST forms are complete and in the home.  Plans remain to avoid rehospitalization.  She would like to remain independent as long as possible with assistance from her grand daughter/POA and son who lives with patient.  2.  Edema:  Improved.  Continue to attempt to decrease processed meals, compression hose and leg elevation.  Strongly encouraged to follow recommendations of cardiologist and dosing of diuretics.   Due to the COVID-19 crisis, this visit occurred telephonically and was consented and initiated by patient and/or family.   I spent 35 minutes providing this consultation,  from 1400 to 1435. More than 50% of the time in this consultation was spent coordinating communication with patient and granddaughter.   HISTORY OF PRESENT ILLNESS: Follow-up with Haley R BurnetteShe reports having less edema of her lower extremities and less chest tightness.  Denies recent illnesses or return to hospital.  Appetite is fair. Palliative Care was asked to help address goals of care.   CODE STATUS: DNAR/DNI  PPS: 50%  HOSPICE ELIGIBILITY/DIAGNOSIS: TBD  PAST MEDICAL HISTORY:  Past Medical History:  Diagnosis Date  . Breast cancer, left breast ([place]) 1992   S/P mastectomy & chemo  . CHF (congestive heart failure) ([place])   . Chronic back pain    "lower and middle back; I've had several broken vertebrae" (12/09/2017)  . CVA (cerebral vascular accident) ([place]) 02/2018  . Heart murmur   . High cholesterol   . History of kidney  stones   . Hypertension   . LBBB (left bundle branch block)    Patient states that she was told by her PCP that she has left bundle blockage  . Osteoarthritis    "some of the joints" (12/09/2017)  . Pneumonia    "when I was a child" (12/09/2017)  . PONV (postoperative nausea and vomiting)      PHYSICAL EXAM:   General: NAD Pulmonary: Speech is not forced Neurological: Alert and oriented.   Gonzella Lex, NP-C

## 2019-04-05 IMAGING — CR DG CHEST 2V
2 series · 2 of 2 positions shown · non-contrast
Comparison: Chest radiographs 11/20/2017.

CLINICAL DATA: 88-year-old female recently treated for CHF. Nausea
and weakness this morning.

EXAM:
CHEST - 2 VIEW

[chest pa]
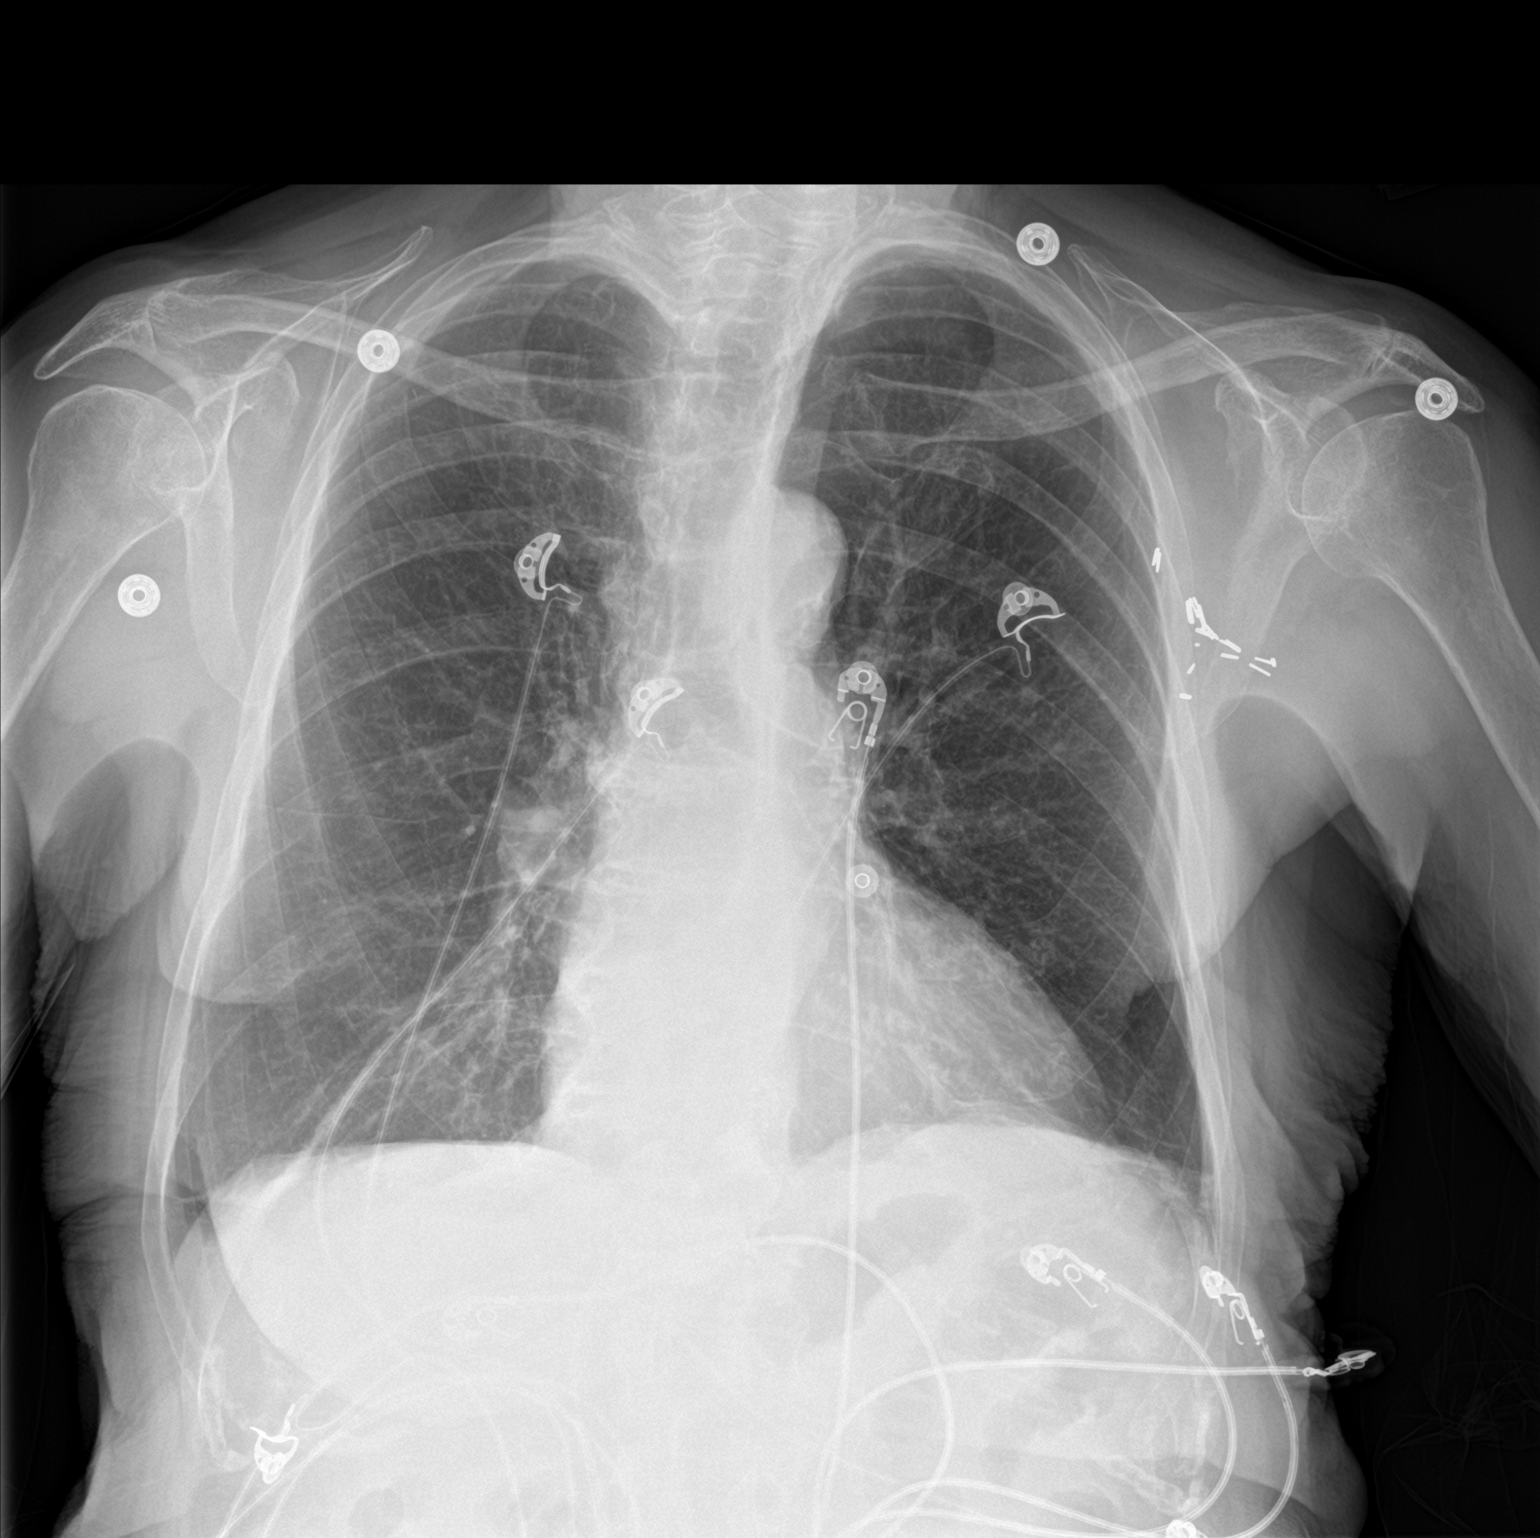

[chest lat]
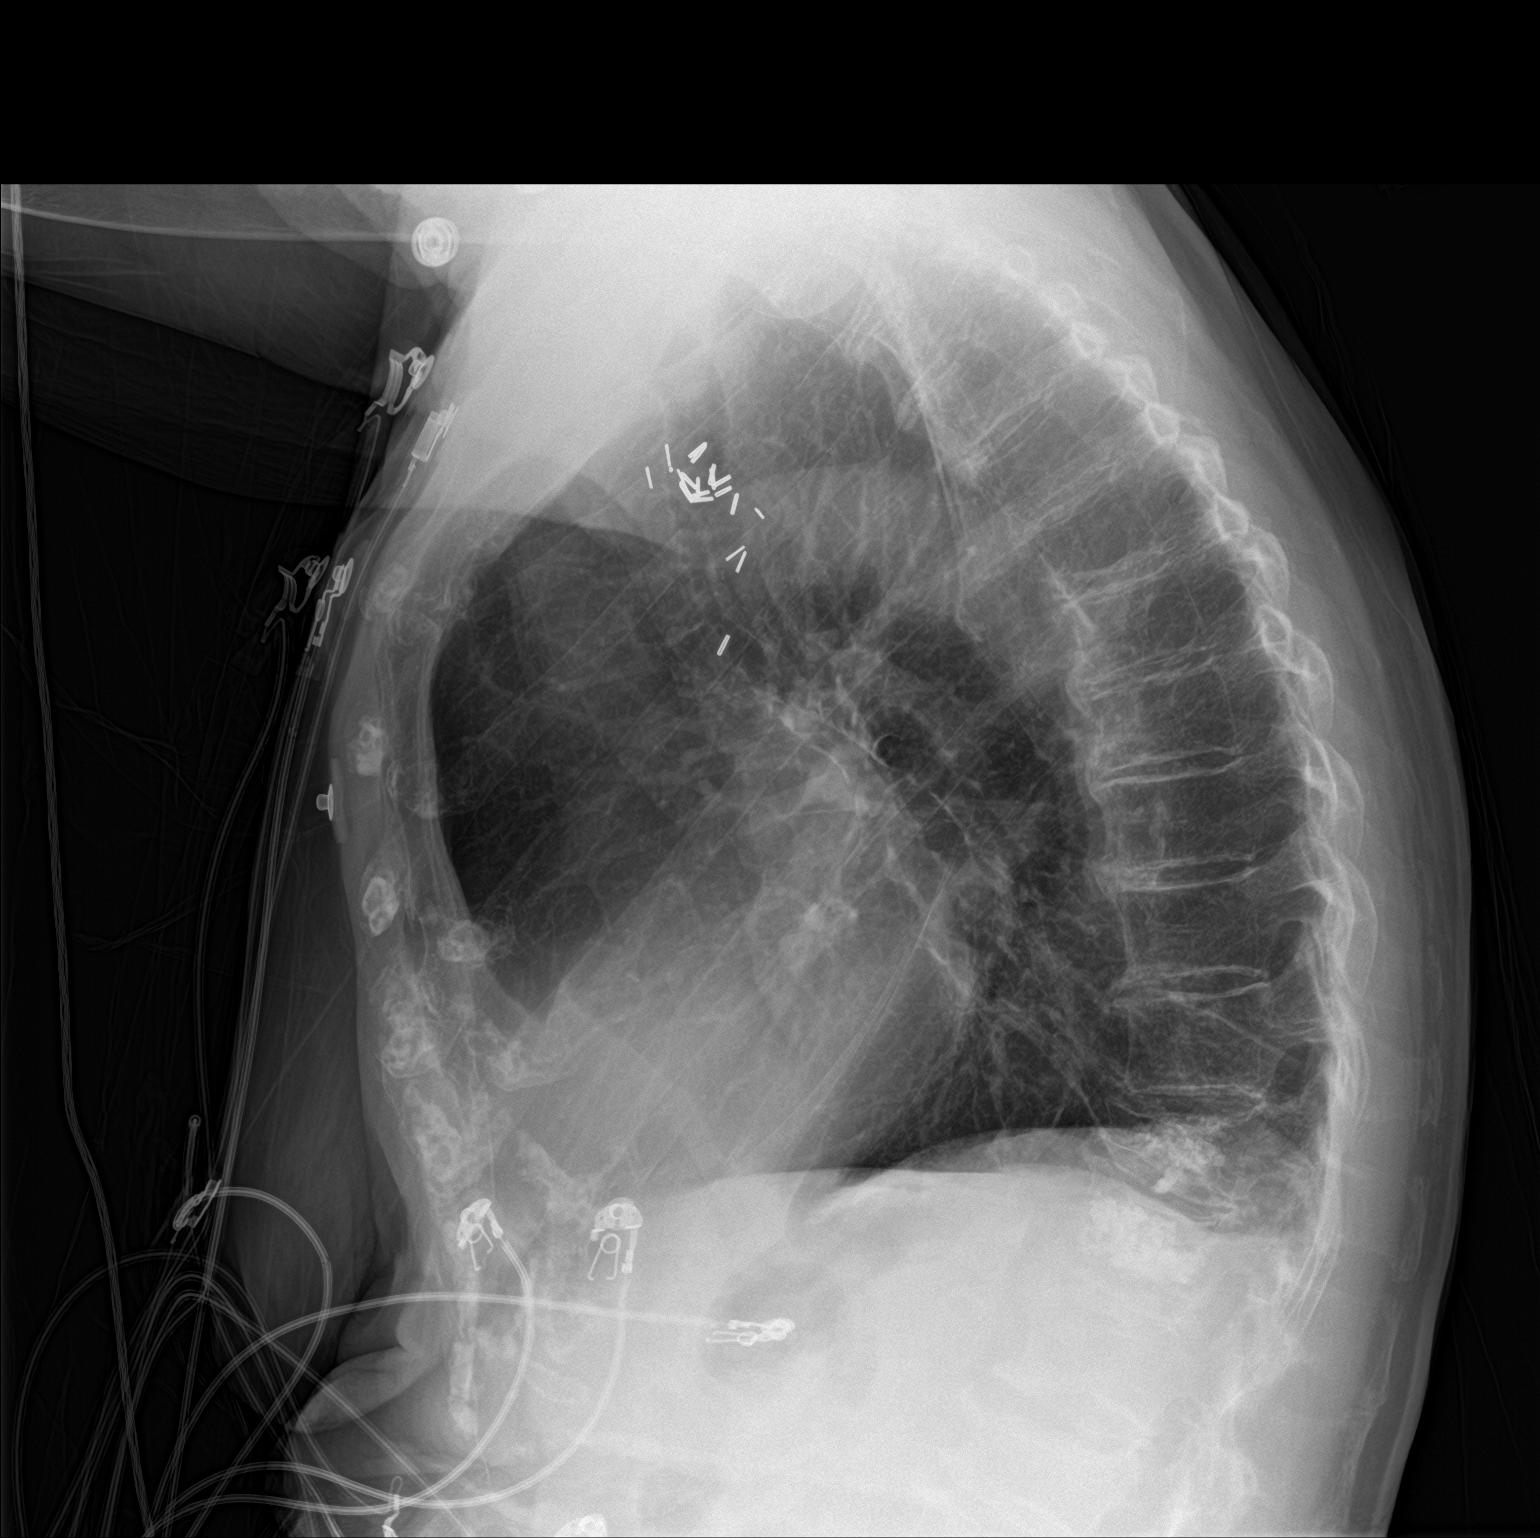

[2 of 2 positions shown; findings below may reference images not displayed]

FINDINGS: Resolved bilateral pleural effusions and pulmonary interstitial
edema since 11/20/2017. Mild pulmonary hyperinflation. Cardiac size
at the upper limits of normal. Other mediastinal contours are within
normal limits. Visualized tracheal air column is within normal
limits. Both lungs appear clear today. No pneumothorax. Chronic
lower thoracic and/or upper lumbar augmented compression fractures.
Osteopenia. No acute osseous abnormality identified. Negative
visible bowel gas pattern.
IMPRESSION: Resolved edema and effusions.  No acute cardiopulmonary abnormality.

## 2019-04-08 DIAGNOSIS — Z853 Personal history of malignant neoplasm of breast: Secondary | ICD-10-CM | POA: Diagnosis not present

## 2019-04-08 DIAGNOSIS — I5023 Acute on chronic systolic (congestive) heart failure: Secondary | ICD-10-CM | POA: Diagnosis not present

## 2019-04-08 DIAGNOSIS — I480 Paroxysmal atrial fibrillation: Secondary | ICD-10-CM | POA: Diagnosis not present

## 2019-04-08 DIAGNOSIS — M1990 Unspecified osteoarthritis, unspecified site: Secondary | ICD-10-CM | POA: Diagnosis not present

## 2019-04-08 DIAGNOSIS — I110 Hypertensive heart disease with heart failure: Secondary | ICD-10-CM | POA: Diagnosis not present

## 2019-04-08 DIAGNOSIS — I69318 Other symptoms and signs involving cognitive functions following cerebral infarction: Secondary | ICD-10-CM | POA: Diagnosis not present

## 2019-04-09 ENCOUNTER — Telehealth: Payer: Self-pay | Admitting: Cardiology

## 2019-04-09 DIAGNOSIS — I110 Hypertensive heart disease with heart failure: Secondary | ICD-10-CM | POA: Diagnosis not present

## 2019-04-09 DIAGNOSIS — Z853 Personal history of malignant neoplasm of breast: Secondary | ICD-10-CM | POA: Diagnosis not present

## 2019-04-09 DIAGNOSIS — I69318 Other symptoms and signs involving cognitive functions following cerebral infarction: Secondary | ICD-10-CM | POA: Diagnosis not present

## 2019-04-09 DIAGNOSIS — M1990 Unspecified osteoarthritis, unspecified site: Secondary | ICD-10-CM | POA: Diagnosis not present

## 2019-04-09 DIAGNOSIS — I5023 Acute on chronic systolic (congestive) heart failure: Secondary | ICD-10-CM | POA: Diagnosis not present

## 2019-04-09 DIAGNOSIS — I480 Paroxysmal atrial fibrillation: Secondary | ICD-10-CM | POA: Diagnosis not present

## 2019-04-09 NOTE — Telephone Encounter (Signed)
Spoke with nancy, she reports the patient is now in hospice and continues to have swelling in her legs. She reports she has weeping of both legs, L>R. She does have some SOB but usually related to anxiety. They will have the hospice doctor manage her medications and will contact us if needed. Aware torsemide is as needed for swelling and made aware the last bmp was December for [place].

## 2019-04-09 NOTE — Telephone Encounter (Signed)
Pt c/o medication issue:  1. Name of Medication: torsemide (DEMADEX) 20 MG tablet  2. How are you currently taking this medication (dosage and times per day)? Bottle says two every morning  3. Are you having a reaction (difficulty breathing--STAT)? no  4. What is your medication issue? Haley Costa  from [place] states the patient has been taking 3 tablets in the morning and 2 in the evening if her weight is above 140. If her weight is under 140, she is taking 3 in the morning and 1 in the evening. Izora Gala would like a call back about whether this is how to patient should proceed. She states the patient is also taking potassium chloride 20 MEQ TBCR and would like to know if that is okay.

## 2019-04-10 DIAGNOSIS — I110 Hypertensive heart disease with heart failure: Secondary | ICD-10-CM | POA: Diagnosis not present

## 2019-04-10 DIAGNOSIS — M1990 Unspecified osteoarthritis, unspecified site: Secondary | ICD-10-CM | POA: Diagnosis not present

## 2019-04-10 DIAGNOSIS — I5023 Acute on chronic systolic (congestive) heart failure: Secondary | ICD-10-CM | POA: Diagnosis not present

## 2019-04-10 DIAGNOSIS — I480 Paroxysmal atrial fibrillation: Secondary | ICD-10-CM | POA: Diagnosis not present

## 2019-04-10 DIAGNOSIS — Z853 Personal history of malignant neoplasm of breast: Secondary | ICD-10-CM | POA: Diagnosis not present

## 2019-04-10 DIAGNOSIS — I69318 Other symptoms and signs involving cognitive functions following cerebral infarction: Secondary | ICD-10-CM | POA: Diagnosis not present

## 2019-04-13 DIAGNOSIS — I110 Hypertensive heart disease with heart failure: Secondary | ICD-10-CM | POA: Diagnosis not present

## 2019-04-13 DIAGNOSIS — M1990 Unspecified osteoarthritis, unspecified site: Secondary | ICD-10-CM | POA: Diagnosis not present

## 2019-04-13 DIAGNOSIS — I480 Paroxysmal atrial fibrillation: Secondary | ICD-10-CM | POA: Diagnosis not present

## 2019-04-13 DIAGNOSIS — I5023 Acute on chronic systolic (congestive) heart failure: Secondary | ICD-10-CM | POA: Diagnosis not present

## 2019-04-13 DIAGNOSIS — Z853 Personal history of malignant neoplasm of breast: Secondary | ICD-10-CM | POA: Diagnosis not present

## 2019-04-13 DIAGNOSIS — I69318 Other symptoms and signs involving cognitive functions following cerebral infarction: Secondary | ICD-10-CM | POA: Diagnosis not present

## 2019-04-15 DIAGNOSIS — I480 Paroxysmal atrial fibrillation: Secondary | ICD-10-CM | POA: Diagnosis not present

## 2019-04-15 DIAGNOSIS — I69318 Other symptoms and signs involving cognitive functions following cerebral infarction: Secondary | ICD-10-CM | POA: Diagnosis not present

## 2019-04-15 DIAGNOSIS — I110 Hypertensive heart disease with heart failure: Secondary | ICD-10-CM | POA: Diagnosis not present

## 2019-04-15 DIAGNOSIS — Z853 Personal history of malignant neoplasm of breast: Secondary | ICD-10-CM | POA: Diagnosis not present

## 2019-04-15 DIAGNOSIS — M1990 Unspecified osteoarthritis, unspecified site: Secondary | ICD-10-CM | POA: Diagnosis not present

## 2019-04-15 DIAGNOSIS — I5023 Acute on chronic systolic (congestive) heart failure: Secondary | ICD-10-CM | POA: Diagnosis not present

## 2019-04-16 DIAGNOSIS — Z853 Personal history of malignant neoplasm of breast: Secondary | ICD-10-CM | POA: Diagnosis not present

## 2019-04-16 DIAGNOSIS — M1990 Unspecified osteoarthritis, unspecified site: Secondary | ICD-10-CM | POA: Diagnosis not present

## 2019-04-16 DIAGNOSIS — I5023 Acute on chronic systolic (congestive) heart failure: Secondary | ICD-10-CM | POA: Diagnosis not present

## 2019-04-16 DIAGNOSIS — I69318 Other symptoms and signs involving cognitive functions following cerebral infarction: Secondary | ICD-10-CM | POA: Diagnosis not present

## 2019-04-16 DIAGNOSIS — I110 Hypertensive heart disease with heart failure: Secondary | ICD-10-CM | POA: Diagnosis not present

## 2019-04-16 DIAGNOSIS — I480 Paroxysmal atrial fibrillation: Secondary | ICD-10-CM | POA: Diagnosis not present

## 2019-04-17 DIAGNOSIS — I69318 Other symptoms and signs involving cognitive functions following cerebral infarction: Secondary | ICD-10-CM | POA: Diagnosis not present

## 2019-04-17 DIAGNOSIS — Z853 Personal history of malignant neoplasm of breast: Secondary | ICD-10-CM | POA: Diagnosis not present

## 2019-04-17 DIAGNOSIS — M1990 Unspecified osteoarthritis, unspecified site: Secondary | ICD-10-CM | POA: Diagnosis not present

## 2019-04-17 DIAGNOSIS — I5023 Acute on chronic systolic (congestive) heart failure: Secondary | ICD-10-CM | POA: Diagnosis not present

## 2019-04-17 DIAGNOSIS — I480 Paroxysmal atrial fibrillation: Secondary | ICD-10-CM | POA: Diagnosis not present

## 2019-04-17 DIAGNOSIS — I110 Hypertensive heart disease with heart failure: Secondary | ICD-10-CM | POA: Diagnosis not present

## 2019-04-19 ENCOUNTER — Encounter: Payer: Self-pay | Admitting: *Deleted

## 2019-04-19 DIAGNOSIS — M1990 Unspecified osteoarthritis, unspecified site: Secondary | ICD-10-CM | POA: Diagnosis not present

## 2019-04-19 DIAGNOSIS — I480 Paroxysmal atrial fibrillation: Secondary | ICD-10-CM | POA: Diagnosis not present

## 2019-04-19 DIAGNOSIS — Z741 Need for assistance with personal care: Secondary | ICD-10-CM | POA: Diagnosis not present

## 2019-04-19 DIAGNOSIS — Z853 Personal history of malignant neoplasm of breast: Secondary | ICD-10-CM | POA: Diagnosis not present

## 2019-04-19 DIAGNOSIS — I110 Hypertensive heart disease with heart failure: Secondary | ICD-10-CM | POA: Diagnosis not present

## 2019-04-19 DIAGNOSIS — I5023 Acute on chronic systolic (congestive) heart failure: Secondary | ICD-10-CM | POA: Diagnosis not present

## 2019-04-19 DIAGNOSIS — I69318 Other symptoms and signs involving cognitive functions following cerebral infarction: Secondary | ICD-10-CM | POA: Diagnosis not present

## 2019-04-19 NOTE — Telephone Encounter (Signed)
This encounter was created in error - please disregard.

## 2019-04-20 DIAGNOSIS — M1990 Unspecified osteoarthritis, unspecified site: Secondary | ICD-10-CM | POA: Diagnosis not present

## 2019-04-20 DIAGNOSIS — I69318 Other symptoms and signs involving cognitive functions following cerebral infarction: Secondary | ICD-10-CM | POA: Diagnosis not present

## 2019-04-20 DIAGNOSIS — I5023 Acute on chronic systolic (congestive) heart failure: Secondary | ICD-10-CM | POA: Diagnosis not present

## 2019-04-20 DIAGNOSIS — Z853 Personal history of malignant neoplasm of breast: Secondary | ICD-10-CM | POA: Diagnosis not present

## 2019-04-20 DIAGNOSIS — I110 Hypertensive heart disease with heart failure: Secondary | ICD-10-CM | POA: Diagnosis not present

## 2019-04-20 DIAGNOSIS — I480 Paroxysmal atrial fibrillation: Secondary | ICD-10-CM | POA: Diagnosis not present

## 2019-04-21 DIAGNOSIS — I69318 Other symptoms and signs involving cognitive functions following cerebral infarction: Secondary | ICD-10-CM | POA: Diagnosis not present

## 2019-04-21 DIAGNOSIS — I110 Hypertensive heart disease with heart failure: Secondary | ICD-10-CM | POA: Diagnosis not present

## 2019-04-21 DIAGNOSIS — M1990 Unspecified osteoarthritis, unspecified site: Secondary | ICD-10-CM | POA: Diagnosis not present

## 2019-04-21 DIAGNOSIS — Z853 Personal history of malignant neoplasm of breast: Secondary | ICD-10-CM | POA: Diagnosis not present

## 2019-04-21 DIAGNOSIS — I480 Paroxysmal atrial fibrillation: Secondary | ICD-10-CM | POA: Diagnosis not present

## 2019-04-21 DIAGNOSIS — I5023 Acute on chronic systolic (congestive) heart failure: Secondary | ICD-10-CM | POA: Diagnosis not present

## 2019-04-22 DIAGNOSIS — I69318 Other symptoms and signs involving cognitive functions following cerebral infarction: Secondary | ICD-10-CM | POA: Diagnosis not present

## 2019-04-22 DIAGNOSIS — I5023 Acute on chronic systolic (congestive) heart failure: Secondary | ICD-10-CM | POA: Diagnosis not present

## 2019-04-22 DIAGNOSIS — Z853 Personal history of malignant neoplasm of breast: Secondary | ICD-10-CM | POA: Diagnosis not present

## 2019-04-22 DIAGNOSIS — M1990 Unspecified osteoarthritis, unspecified site: Secondary | ICD-10-CM | POA: Diagnosis not present

## 2019-04-22 DIAGNOSIS — I480 Paroxysmal atrial fibrillation: Secondary | ICD-10-CM | POA: Diagnosis not present

## 2019-04-22 DIAGNOSIS — I110 Hypertensive heart disease with heart failure: Secondary | ICD-10-CM | POA: Diagnosis not present

## 2019-04-23 DIAGNOSIS — I110 Hypertensive heart disease with heart failure: Secondary | ICD-10-CM | POA: Diagnosis not present

## 2019-04-23 DIAGNOSIS — I480 Paroxysmal atrial fibrillation: Secondary | ICD-10-CM | POA: Diagnosis not present

## 2019-04-23 DIAGNOSIS — I69318 Other symptoms and signs involving cognitive functions following cerebral infarction: Secondary | ICD-10-CM | POA: Diagnosis not present

## 2019-04-23 DIAGNOSIS — Z853 Personal history of malignant neoplasm of breast: Secondary | ICD-10-CM | POA: Diagnosis not present

## 2019-04-23 DIAGNOSIS — I5023 Acute on chronic systolic (congestive) heart failure: Secondary | ICD-10-CM | POA: Diagnosis not present

## 2019-04-23 DIAGNOSIS — M1990 Unspecified osteoarthritis, unspecified site: Secondary | ICD-10-CM | POA: Diagnosis not present

## 2019-04-24 DIAGNOSIS — Z853 Personal history of malignant neoplasm of breast: Secondary | ICD-10-CM | POA: Diagnosis not present

## 2019-04-24 DIAGNOSIS — I69318 Other symptoms and signs involving cognitive functions following cerebral infarction: Secondary | ICD-10-CM | POA: Diagnosis not present

## 2019-04-24 DIAGNOSIS — M1990 Unspecified osteoarthritis, unspecified site: Secondary | ICD-10-CM | POA: Diagnosis not present

## 2019-04-24 DIAGNOSIS — I110 Hypertensive heart disease with heart failure: Secondary | ICD-10-CM | POA: Diagnosis not present

## 2019-04-24 DIAGNOSIS — I480 Paroxysmal atrial fibrillation: Secondary | ICD-10-CM | POA: Diagnosis not present

## 2019-04-24 DIAGNOSIS — I5023 Acute on chronic systolic (congestive) heart failure: Secondary | ICD-10-CM | POA: Diagnosis not present

## 2019-04-29 DIAGNOSIS — I480 Paroxysmal atrial fibrillation: Secondary | ICD-10-CM | POA: Diagnosis not present

## 2019-04-29 DIAGNOSIS — M1990 Unspecified osteoarthritis, unspecified site: Secondary | ICD-10-CM | POA: Diagnosis not present

## 2019-04-29 DIAGNOSIS — I69318 Other symptoms and signs involving cognitive functions following cerebral infarction: Secondary | ICD-10-CM | POA: Diagnosis not present

## 2019-04-29 DIAGNOSIS — I110 Hypertensive heart disease with heart failure: Secondary | ICD-10-CM | POA: Diagnosis not present

## 2019-04-29 DIAGNOSIS — Z853 Personal history of malignant neoplasm of breast: Secondary | ICD-10-CM | POA: Diagnosis not present

## 2019-04-29 DIAGNOSIS — I5023 Acute on chronic systolic (congestive) heart failure: Secondary | ICD-10-CM | POA: Diagnosis not present

## 2019-04-30 ENCOUNTER — Telehealth: Payer: Self-pay | Admitting: Cardiology

## 2019-04-30 DIAGNOSIS — I110 Hypertensive heart disease with heart failure: Secondary | ICD-10-CM | POA: Diagnosis not present

## 2019-04-30 DIAGNOSIS — Z853 Personal history of malignant neoplasm of breast: Secondary | ICD-10-CM | POA: Diagnosis not present

## 2019-04-30 DIAGNOSIS — I480 Paroxysmal atrial fibrillation: Secondary | ICD-10-CM | POA: Diagnosis not present

## 2019-04-30 DIAGNOSIS — I5023 Acute on chronic systolic (congestive) heart failure: Secondary | ICD-10-CM | POA: Diagnosis not present

## 2019-04-30 DIAGNOSIS — I69318 Other symptoms and signs involving cognitive functions following cerebral infarction: Secondary | ICD-10-CM | POA: Diagnosis not present

## 2019-04-30 DIAGNOSIS — M1990 Unspecified osteoarthritis, unspecified site: Secondary | ICD-10-CM | POA: Diagnosis not present

## 2019-04-30 NOTE — Telephone Encounter (Signed)
Attempt to return call-line remains busy.  Do not see lasix on med list.  Only torsemide PRN for swelling.

## 2019-04-30 NOTE — Telephone Encounter (Signed)
New message  Per Izora Gala wants to know if the afternoon lasix can be eliminated. Please call to discuss.

## 2019-05-03 DIAGNOSIS — I5023 Acute on chronic systolic (congestive) heart failure: Secondary | ICD-10-CM | POA: Diagnosis not present

## 2019-05-03 DIAGNOSIS — I480 Paroxysmal atrial fibrillation: Secondary | ICD-10-CM | POA: Diagnosis not present

## 2019-05-03 DIAGNOSIS — M1990 Unspecified osteoarthritis, unspecified site: Secondary | ICD-10-CM | POA: Diagnosis not present

## 2019-05-03 DIAGNOSIS — I69318 Other symptoms and signs involving cognitive functions following cerebral infarction: Secondary | ICD-10-CM | POA: Diagnosis not present

## 2019-05-03 DIAGNOSIS — I110 Hypertensive heart disease with heart failure: Secondary | ICD-10-CM | POA: Diagnosis not present

## 2019-05-03 DIAGNOSIS — Z853 Personal history of malignant neoplasm of breast: Secondary | ICD-10-CM | POA: Diagnosis not present

## 2019-05-03 NOTE — Telephone Encounter (Signed)
Left message for Haley Costa, orders should come from hospice attending. Okay to hold afternoon dose if needed.

## 2019-05-08 DIAGNOSIS — I69318 Other symptoms and signs involving cognitive functions following cerebral infarction: Secondary | ICD-10-CM | POA: Diagnosis not present

## 2019-05-08 DIAGNOSIS — I5023 Acute on chronic systolic (congestive) heart failure: Secondary | ICD-10-CM | POA: Diagnosis not present

## 2019-05-08 DIAGNOSIS — I110 Hypertensive heart disease with heart failure: Secondary | ICD-10-CM | POA: Diagnosis not present

## 2019-05-08 DIAGNOSIS — Z853 Personal history of malignant neoplasm of breast: Secondary | ICD-10-CM | POA: Diagnosis not present

## 2019-05-08 DIAGNOSIS — M1990 Unspecified osteoarthritis, unspecified site: Secondary | ICD-10-CM | POA: Diagnosis not present

## 2019-05-08 DIAGNOSIS — I480 Paroxysmal atrial fibrillation: Secondary | ICD-10-CM | POA: Diagnosis not present

## 2019-05-12 DIAGNOSIS — I69318 Other symptoms and signs involving cognitive functions following cerebral infarction: Secondary | ICD-10-CM | POA: Diagnosis not present

## 2019-05-12 DIAGNOSIS — I5023 Acute on chronic systolic (congestive) heart failure: Secondary | ICD-10-CM | POA: Diagnosis not present

## 2019-05-12 DIAGNOSIS — I480 Paroxysmal atrial fibrillation: Secondary | ICD-10-CM | POA: Diagnosis not present

## 2019-05-12 DIAGNOSIS — Z853 Personal history of malignant neoplasm of breast: Secondary | ICD-10-CM | POA: Diagnosis not present

## 2019-05-12 DIAGNOSIS — I110 Hypertensive heart disease with heart failure: Secondary | ICD-10-CM | POA: Diagnosis not present

## 2019-05-12 DIAGNOSIS — M1990 Unspecified osteoarthritis, unspecified site: Secondary | ICD-10-CM | POA: Diagnosis not present

## 2019-05-13 DIAGNOSIS — I69318 Other symptoms and signs involving cognitive functions following cerebral infarction: Secondary | ICD-10-CM | POA: Diagnosis not present

## 2019-05-13 DIAGNOSIS — I480 Paroxysmal atrial fibrillation: Secondary | ICD-10-CM | POA: Diagnosis not present

## 2019-05-13 DIAGNOSIS — M1990 Unspecified osteoarthritis, unspecified site: Secondary | ICD-10-CM | POA: Diagnosis not present

## 2019-05-13 DIAGNOSIS — I5023 Acute on chronic systolic (congestive) heart failure: Secondary | ICD-10-CM | POA: Diagnosis not present

## 2019-05-13 DIAGNOSIS — I110 Hypertensive heart disease with heart failure: Secondary | ICD-10-CM | POA: Diagnosis not present

## 2019-05-13 DIAGNOSIS — Z853 Personal history of malignant neoplasm of breast: Secondary | ICD-10-CM | POA: Diagnosis not present

## 2019-05-15 DIAGNOSIS — M1990 Unspecified osteoarthritis, unspecified site: Secondary | ICD-10-CM | POA: Diagnosis not present

## 2019-05-15 DIAGNOSIS — I69318 Other symptoms and signs involving cognitive functions following cerebral infarction: Secondary | ICD-10-CM | POA: Diagnosis not present

## 2019-05-15 DIAGNOSIS — I480 Paroxysmal atrial fibrillation: Secondary | ICD-10-CM | POA: Diagnosis not present

## 2019-05-15 DIAGNOSIS — I5023 Acute on chronic systolic (congestive) heart failure: Secondary | ICD-10-CM | POA: Diagnosis not present

## 2019-05-15 DIAGNOSIS — Z853 Personal history of malignant neoplasm of breast: Secondary | ICD-10-CM | POA: Diagnosis not present

## 2019-05-15 DIAGNOSIS — I110 Hypertensive heart disease with heart failure: Secondary | ICD-10-CM | POA: Diagnosis not present

## 2019-05-16 DIAGNOSIS — I480 Paroxysmal atrial fibrillation: Secondary | ICD-10-CM | POA: Diagnosis not present

## 2019-05-16 DIAGNOSIS — Z853 Personal history of malignant neoplasm of breast: Secondary | ICD-10-CM | POA: Diagnosis not present

## 2019-05-16 DIAGNOSIS — I110 Hypertensive heart disease with heart failure: Secondary | ICD-10-CM | POA: Diagnosis not present

## 2019-05-16 DIAGNOSIS — I5023 Acute on chronic systolic (congestive) heart failure: Secondary | ICD-10-CM | POA: Diagnosis not present

## 2019-05-16 DIAGNOSIS — I69318 Other symptoms and signs involving cognitive functions following cerebral infarction: Secondary | ICD-10-CM | POA: Diagnosis not present

## 2019-05-16 DIAGNOSIS — M1990 Unspecified osteoarthritis, unspecified site: Secondary | ICD-10-CM | POA: Diagnosis not present

## 2019-05-17 DIAGNOSIS — I69318 Other symptoms and signs involving cognitive functions following cerebral infarction: Secondary | ICD-10-CM | POA: Diagnosis not present

## 2019-05-17 DIAGNOSIS — I480 Paroxysmal atrial fibrillation: Secondary | ICD-10-CM | POA: Diagnosis not present

## 2019-05-17 DIAGNOSIS — I5023 Acute on chronic systolic (congestive) heart failure: Secondary | ICD-10-CM | POA: Diagnosis not present

## 2019-05-17 DIAGNOSIS — Z741 Need for assistance with personal care: Secondary | ICD-10-CM | POA: Diagnosis not present

## 2019-05-17 DIAGNOSIS — Z853 Personal history of malignant neoplasm of breast: Secondary | ICD-10-CM | POA: Diagnosis not present

## 2019-05-17 DIAGNOSIS — I110 Hypertensive heart disease with heart failure: Secondary | ICD-10-CM | POA: Diagnosis not present

## 2019-05-17 DIAGNOSIS — M1990 Unspecified osteoarthritis, unspecified site: Secondary | ICD-10-CM | POA: Diagnosis not present

## 2019-05-18 DIAGNOSIS — I480 Paroxysmal atrial fibrillation: Secondary | ICD-10-CM | POA: Diagnosis not present

## 2019-05-18 DIAGNOSIS — Z853 Personal history of malignant neoplasm of breast: Secondary | ICD-10-CM | POA: Diagnosis not present

## 2019-05-18 DIAGNOSIS — I110 Hypertensive heart disease with heart failure: Secondary | ICD-10-CM | POA: Diagnosis not present

## 2019-05-18 DIAGNOSIS — M1990 Unspecified osteoarthritis, unspecified site: Secondary | ICD-10-CM | POA: Diagnosis not present

## 2019-05-18 DIAGNOSIS — I69318 Other symptoms and signs involving cognitive functions following cerebral infarction: Secondary | ICD-10-CM | POA: Diagnosis not present

## 2019-05-18 DIAGNOSIS — I5023 Acute on chronic systolic (congestive) heart failure: Secondary | ICD-10-CM | POA: Diagnosis not present

## 2019-05-20 DIAGNOSIS — I480 Paroxysmal atrial fibrillation: Secondary | ICD-10-CM | POA: Diagnosis not present

## 2019-05-20 DIAGNOSIS — I5023 Acute on chronic systolic (congestive) heart failure: Secondary | ICD-10-CM | POA: Diagnosis not present

## 2019-05-20 DIAGNOSIS — Z853 Personal history of malignant neoplasm of breast: Secondary | ICD-10-CM | POA: Diagnosis not present

## 2019-05-20 DIAGNOSIS — M1990 Unspecified osteoarthritis, unspecified site: Secondary | ICD-10-CM | POA: Diagnosis not present

## 2019-05-20 DIAGNOSIS — I110 Hypertensive heart disease with heart failure: Secondary | ICD-10-CM | POA: Diagnosis not present

## 2019-05-20 DIAGNOSIS — I69318 Other symptoms and signs involving cognitive functions following cerebral infarction: Secondary | ICD-10-CM | POA: Diagnosis not present

## 2019-05-21 DIAGNOSIS — Z853 Personal history of malignant neoplasm of breast: Secondary | ICD-10-CM | POA: Diagnosis not present

## 2019-05-21 DIAGNOSIS — M1990 Unspecified osteoarthritis, unspecified site: Secondary | ICD-10-CM | POA: Diagnosis not present

## 2019-05-21 DIAGNOSIS — I480 Paroxysmal atrial fibrillation: Secondary | ICD-10-CM | POA: Diagnosis not present

## 2019-05-21 DIAGNOSIS — I110 Hypertensive heart disease with heart failure: Secondary | ICD-10-CM | POA: Diagnosis not present

## 2019-05-21 DIAGNOSIS — I5023 Acute on chronic systolic (congestive) heart failure: Secondary | ICD-10-CM | POA: Diagnosis not present

## 2019-05-21 DIAGNOSIS — I69318 Other symptoms and signs involving cognitive functions following cerebral infarction: Secondary | ICD-10-CM | POA: Diagnosis not present

## 2019-05-22 DIAGNOSIS — M1990 Unspecified osteoarthritis, unspecified site: Secondary | ICD-10-CM | POA: Diagnosis not present

## 2019-05-22 DIAGNOSIS — Z853 Personal history of malignant neoplasm of breast: Secondary | ICD-10-CM | POA: Diagnosis not present

## 2019-05-22 DIAGNOSIS — I110 Hypertensive heart disease with heart failure: Secondary | ICD-10-CM | POA: Diagnosis not present

## 2019-05-22 DIAGNOSIS — I480 Paroxysmal atrial fibrillation: Secondary | ICD-10-CM | POA: Diagnosis not present

## 2019-05-22 DIAGNOSIS — I69318 Other symptoms and signs involving cognitive functions following cerebral infarction: Secondary | ICD-10-CM | POA: Diagnosis not present

## 2019-05-22 DIAGNOSIS — I5023 Acute on chronic systolic (congestive) heart failure: Secondary | ICD-10-CM | POA: Diagnosis not present

## 2019-05-24 DIAGNOSIS — I480 Paroxysmal atrial fibrillation: Secondary | ICD-10-CM | POA: Diagnosis not present

## 2019-05-24 DIAGNOSIS — M1990 Unspecified osteoarthritis, unspecified site: Secondary | ICD-10-CM | POA: Diagnosis not present

## 2019-05-24 DIAGNOSIS — I110 Hypertensive heart disease with heart failure: Secondary | ICD-10-CM | POA: Diagnosis not present

## 2019-05-24 DIAGNOSIS — I69318 Other symptoms and signs involving cognitive functions following cerebral infarction: Secondary | ICD-10-CM | POA: Diagnosis not present

## 2019-05-24 DIAGNOSIS — Z853 Personal history of malignant neoplasm of breast: Secondary | ICD-10-CM | POA: Diagnosis not present

## 2019-05-24 DIAGNOSIS — I5023 Acute on chronic systolic (congestive) heart failure: Secondary | ICD-10-CM | POA: Diagnosis not present

## 2019-05-27 DIAGNOSIS — I480 Paroxysmal atrial fibrillation: Secondary | ICD-10-CM | POA: Diagnosis not present

## 2019-05-27 DIAGNOSIS — M1990 Unspecified osteoarthritis, unspecified site: Secondary | ICD-10-CM | POA: Diagnosis not present

## 2019-05-27 DIAGNOSIS — I69318 Other symptoms and signs involving cognitive functions following cerebral infarction: Secondary | ICD-10-CM | POA: Diagnosis not present

## 2019-05-27 DIAGNOSIS — I5023 Acute on chronic systolic (congestive) heart failure: Secondary | ICD-10-CM | POA: Diagnosis not present

## 2019-05-27 DIAGNOSIS — I110 Hypertensive heart disease with heart failure: Secondary | ICD-10-CM | POA: Diagnosis not present

## 2019-05-27 DIAGNOSIS — Z853 Personal history of malignant neoplasm of breast: Secondary | ICD-10-CM | POA: Diagnosis not present

## 2019-05-28 DIAGNOSIS — I69318 Other symptoms and signs involving cognitive functions following cerebral infarction: Secondary | ICD-10-CM | POA: Diagnosis not present

## 2019-05-28 DIAGNOSIS — Z853 Personal history of malignant neoplasm of breast: Secondary | ICD-10-CM | POA: Diagnosis not present

## 2019-05-28 DIAGNOSIS — I110 Hypertensive heart disease with heart failure: Secondary | ICD-10-CM | POA: Diagnosis not present

## 2019-05-28 DIAGNOSIS — M1990 Unspecified osteoarthritis, unspecified site: Secondary | ICD-10-CM | POA: Diagnosis not present

## 2019-05-28 DIAGNOSIS — I5023 Acute on chronic systolic (congestive) heart failure: Secondary | ICD-10-CM | POA: Diagnosis not present

## 2019-05-28 DIAGNOSIS — I480 Paroxysmal atrial fibrillation: Secondary | ICD-10-CM | POA: Diagnosis not present

## 2019-05-29 DIAGNOSIS — I5023 Acute on chronic systolic (congestive) heart failure: Secondary | ICD-10-CM | POA: Diagnosis not present

## 2019-05-29 DIAGNOSIS — I69318 Other symptoms and signs involving cognitive functions following cerebral infarction: Secondary | ICD-10-CM | POA: Diagnosis not present

## 2019-05-29 DIAGNOSIS — I110 Hypertensive heart disease with heart failure: Secondary | ICD-10-CM | POA: Diagnosis not present

## 2019-05-29 DIAGNOSIS — Z853 Personal history of malignant neoplasm of breast: Secondary | ICD-10-CM | POA: Diagnosis not present

## 2019-05-29 DIAGNOSIS — M1990 Unspecified osteoarthritis, unspecified site: Secondary | ICD-10-CM | POA: Diagnosis not present

## 2019-05-29 DIAGNOSIS — I480 Paroxysmal atrial fibrillation: Secondary | ICD-10-CM | POA: Diagnosis not present

## 2019-05-30 DIAGNOSIS — I69318 Other symptoms and signs involving cognitive functions following cerebral infarction: Secondary | ICD-10-CM | POA: Diagnosis not present

## 2019-05-30 DIAGNOSIS — Z853 Personal history of malignant neoplasm of breast: Secondary | ICD-10-CM | POA: Diagnosis not present

## 2019-05-30 DIAGNOSIS — I5023 Acute on chronic systolic (congestive) heart failure: Secondary | ICD-10-CM | POA: Diagnosis not present

## 2019-05-30 DIAGNOSIS — I480 Paroxysmal atrial fibrillation: Secondary | ICD-10-CM | POA: Diagnosis not present

## 2019-05-30 DIAGNOSIS — I110 Hypertensive heart disease with heart failure: Secondary | ICD-10-CM | POA: Diagnosis not present

## 2019-05-30 DIAGNOSIS — M1990 Unspecified osteoarthritis, unspecified site: Secondary | ICD-10-CM | POA: Diagnosis not present

## 2019-05-31 DIAGNOSIS — I480 Paroxysmal atrial fibrillation: Secondary | ICD-10-CM | POA: Diagnosis not present

## 2019-05-31 DIAGNOSIS — I69318 Other symptoms and signs involving cognitive functions following cerebral infarction: Secondary | ICD-10-CM | POA: Diagnosis not present

## 2019-05-31 DIAGNOSIS — I110 Hypertensive heart disease with heart failure: Secondary | ICD-10-CM | POA: Diagnosis not present

## 2019-05-31 DIAGNOSIS — Z853 Personal history of malignant neoplasm of breast: Secondary | ICD-10-CM | POA: Diagnosis not present

## 2019-05-31 DIAGNOSIS — I5023 Acute on chronic systolic (congestive) heart failure: Secondary | ICD-10-CM | POA: Diagnosis not present

## 2019-05-31 DIAGNOSIS — M1990 Unspecified osteoarthritis, unspecified site: Secondary | ICD-10-CM | POA: Diagnosis not present

## 2019-06-01 DIAGNOSIS — I5023 Acute on chronic systolic (congestive) heart failure: Secondary | ICD-10-CM | POA: Diagnosis not present

## 2019-06-01 DIAGNOSIS — I110 Hypertensive heart disease with heart failure: Secondary | ICD-10-CM | POA: Diagnosis not present

## 2019-06-01 DIAGNOSIS — I480 Paroxysmal atrial fibrillation: Secondary | ICD-10-CM | POA: Diagnosis not present

## 2019-06-01 DIAGNOSIS — I69318 Other symptoms and signs involving cognitive functions following cerebral infarction: Secondary | ICD-10-CM | POA: Diagnosis not present

## 2019-06-01 DIAGNOSIS — M1990 Unspecified osteoarthritis, unspecified site: Secondary | ICD-10-CM | POA: Diagnosis not present

## 2019-06-01 DIAGNOSIS — Z853 Personal history of malignant neoplasm of breast: Secondary | ICD-10-CM | POA: Diagnosis not present

## 2019-06-03 DIAGNOSIS — M1990 Unspecified osteoarthritis, unspecified site: Secondary | ICD-10-CM | POA: Diagnosis not present

## 2019-06-03 DIAGNOSIS — I480 Paroxysmal atrial fibrillation: Secondary | ICD-10-CM | POA: Diagnosis not present

## 2019-06-03 DIAGNOSIS — I69318 Other symptoms and signs involving cognitive functions following cerebral infarction: Secondary | ICD-10-CM | POA: Diagnosis not present

## 2019-06-03 DIAGNOSIS — I5023 Acute on chronic systolic (congestive) heart failure: Secondary | ICD-10-CM | POA: Diagnosis not present

## 2019-06-03 DIAGNOSIS — Z853 Personal history of malignant neoplasm of breast: Secondary | ICD-10-CM | POA: Diagnosis not present

## 2019-06-03 DIAGNOSIS — I110 Hypertensive heart disease with heart failure: Secondary | ICD-10-CM | POA: Diagnosis not present

## 2019-06-04 DIAGNOSIS — I5023 Acute on chronic systolic (congestive) heart failure: Secondary | ICD-10-CM | POA: Diagnosis not present

## 2019-06-04 DIAGNOSIS — I110 Hypertensive heart disease with heart failure: Secondary | ICD-10-CM | POA: Diagnosis not present

## 2019-06-04 DIAGNOSIS — I69318 Other symptoms and signs involving cognitive functions following cerebral infarction: Secondary | ICD-10-CM | POA: Diagnosis not present

## 2019-06-04 DIAGNOSIS — I480 Paroxysmal atrial fibrillation: Secondary | ICD-10-CM | POA: Diagnosis not present

## 2019-06-04 DIAGNOSIS — M1990 Unspecified osteoarthritis, unspecified site: Secondary | ICD-10-CM | POA: Diagnosis not present

## 2019-06-04 DIAGNOSIS — Z853 Personal history of malignant neoplasm of breast: Secondary | ICD-10-CM | POA: Diagnosis not present

## 2019-06-05 DIAGNOSIS — M1990 Unspecified osteoarthritis, unspecified site: Secondary | ICD-10-CM | POA: Diagnosis not present

## 2019-06-05 DIAGNOSIS — I110 Hypertensive heart disease with heart failure: Secondary | ICD-10-CM | POA: Diagnosis not present

## 2019-06-05 DIAGNOSIS — I69318 Other symptoms and signs involving cognitive functions following cerebral infarction: Secondary | ICD-10-CM | POA: Diagnosis not present

## 2019-06-05 DIAGNOSIS — I5023 Acute on chronic systolic (congestive) heart failure: Secondary | ICD-10-CM | POA: Diagnosis not present

## 2019-06-05 DIAGNOSIS — Z853 Personal history of malignant neoplasm of breast: Secondary | ICD-10-CM | POA: Diagnosis not present

## 2019-06-05 DIAGNOSIS — I480 Paroxysmal atrial fibrillation: Secondary | ICD-10-CM | POA: Diagnosis not present

## 2019-06-07 DIAGNOSIS — I480 Paroxysmal atrial fibrillation: Secondary | ICD-10-CM | POA: Diagnosis not present

## 2019-06-07 DIAGNOSIS — I5023 Acute on chronic systolic (congestive) heart failure: Secondary | ICD-10-CM | POA: Diagnosis not present

## 2019-06-07 DIAGNOSIS — I69318 Other symptoms and signs involving cognitive functions following cerebral infarction: Secondary | ICD-10-CM | POA: Diagnosis not present

## 2019-06-07 DIAGNOSIS — I110 Hypertensive heart disease with heart failure: Secondary | ICD-10-CM | POA: Diagnosis not present

## 2019-06-07 DIAGNOSIS — Z853 Personal history of malignant neoplasm of breast: Secondary | ICD-10-CM | POA: Diagnosis not present

## 2019-06-07 DIAGNOSIS — M1990 Unspecified osteoarthritis, unspecified site: Secondary | ICD-10-CM | POA: Diagnosis not present

## 2019-06-08 DIAGNOSIS — I69318 Other symptoms and signs involving cognitive functions following cerebral infarction: Secondary | ICD-10-CM | POA: Diagnosis not present

## 2019-06-08 DIAGNOSIS — Z853 Personal history of malignant neoplasm of breast: Secondary | ICD-10-CM | POA: Diagnosis not present

## 2019-06-08 DIAGNOSIS — I480 Paroxysmal atrial fibrillation: Secondary | ICD-10-CM | POA: Diagnosis not present

## 2019-06-08 DIAGNOSIS — I5023 Acute on chronic systolic (congestive) heart failure: Secondary | ICD-10-CM | POA: Diagnosis not present

## 2019-06-08 DIAGNOSIS — I110 Hypertensive heart disease with heart failure: Secondary | ICD-10-CM | POA: Diagnosis not present

## 2019-06-08 DIAGNOSIS — M1990 Unspecified osteoarthritis, unspecified site: Secondary | ICD-10-CM | POA: Diagnosis not present

## 2019-06-10 DIAGNOSIS — I480 Paroxysmal atrial fibrillation: Secondary | ICD-10-CM | POA: Diagnosis not present

## 2019-06-10 DIAGNOSIS — Z853 Personal history of malignant neoplasm of breast: Secondary | ICD-10-CM | POA: Diagnosis not present

## 2019-06-10 DIAGNOSIS — I69318 Other symptoms and signs involving cognitive functions following cerebral infarction: Secondary | ICD-10-CM | POA: Diagnosis not present

## 2019-06-10 DIAGNOSIS — I110 Hypertensive heart disease with heart failure: Secondary | ICD-10-CM | POA: Diagnosis not present

## 2019-06-10 DIAGNOSIS — M1990 Unspecified osteoarthritis, unspecified site: Secondary | ICD-10-CM | POA: Diagnosis not present

## 2019-06-10 DIAGNOSIS — I5023 Acute on chronic systolic (congestive) heart failure: Secondary | ICD-10-CM | POA: Diagnosis not present

## 2019-06-12 DIAGNOSIS — I69318 Other symptoms and signs involving cognitive functions following cerebral infarction: Secondary | ICD-10-CM | POA: Diagnosis not present

## 2019-06-12 DIAGNOSIS — I480 Paroxysmal atrial fibrillation: Secondary | ICD-10-CM | POA: Diagnosis not present

## 2019-06-12 DIAGNOSIS — I5023 Acute on chronic systolic (congestive) heart failure: Secondary | ICD-10-CM | POA: Diagnosis not present

## 2019-06-12 DIAGNOSIS — I110 Hypertensive heart disease with heart failure: Secondary | ICD-10-CM | POA: Diagnosis not present

## 2019-06-12 DIAGNOSIS — Z853 Personal history of malignant neoplasm of breast: Secondary | ICD-10-CM | POA: Diagnosis not present

## 2019-06-12 DIAGNOSIS — M1990 Unspecified osteoarthritis, unspecified site: Secondary | ICD-10-CM | POA: Diagnosis not present

## 2019-06-14 DIAGNOSIS — Z853 Personal history of malignant neoplasm of breast: Secondary | ICD-10-CM | POA: Diagnosis not present

## 2019-06-14 DIAGNOSIS — M1990 Unspecified osteoarthritis, unspecified site: Secondary | ICD-10-CM | POA: Diagnosis not present

## 2019-06-14 DIAGNOSIS — I5023 Acute on chronic systolic (congestive) heart failure: Secondary | ICD-10-CM | POA: Diagnosis not present

## 2019-06-14 DIAGNOSIS — I110 Hypertensive heart disease with heart failure: Secondary | ICD-10-CM | POA: Diagnosis not present

## 2019-06-14 DIAGNOSIS — I69318 Other symptoms and signs involving cognitive functions following cerebral infarction: Secondary | ICD-10-CM | POA: Diagnosis not present

## 2019-06-14 DIAGNOSIS — I480 Paroxysmal atrial fibrillation: Secondary | ICD-10-CM | POA: Diagnosis not present

## 2019-06-15 DIAGNOSIS — I110 Hypertensive heart disease with heart failure: Secondary | ICD-10-CM | POA: Diagnosis not present

## 2019-06-15 DIAGNOSIS — M1990 Unspecified osteoarthritis, unspecified site: Secondary | ICD-10-CM | POA: Diagnosis not present

## 2019-06-15 DIAGNOSIS — I5023 Acute on chronic systolic (congestive) heart failure: Secondary | ICD-10-CM | POA: Diagnosis not present

## 2019-06-15 DIAGNOSIS — I480 Paroxysmal atrial fibrillation: Secondary | ICD-10-CM | POA: Diagnosis not present

## 2019-06-15 DIAGNOSIS — Z853 Personal history of malignant neoplasm of breast: Secondary | ICD-10-CM | POA: Diagnosis not present

## 2019-06-15 DIAGNOSIS — I69318 Other symptoms and signs involving cognitive functions following cerebral infarction: Secondary | ICD-10-CM | POA: Diagnosis not present

## 2019-06-17 DIAGNOSIS — Z853 Personal history of malignant neoplasm of breast: Secondary | ICD-10-CM | POA: Diagnosis not present

## 2019-06-17 DIAGNOSIS — I480 Paroxysmal atrial fibrillation: Secondary | ICD-10-CM | POA: Diagnosis not present

## 2019-06-17 DIAGNOSIS — M1990 Unspecified osteoarthritis, unspecified site: Secondary | ICD-10-CM | POA: Diagnosis not present

## 2019-06-17 DIAGNOSIS — Z741 Need for assistance with personal care: Secondary | ICD-10-CM | POA: Diagnosis not present

## 2019-06-17 DIAGNOSIS — I110 Hypertensive heart disease with heart failure: Secondary | ICD-10-CM | POA: Diagnosis not present

## 2019-06-17 DIAGNOSIS — I69318 Other symptoms and signs involving cognitive functions following cerebral infarction: Secondary | ICD-10-CM | POA: Diagnosis not present

## 2019-06-17 DIAGNOSIS — I5023 Acute on chronic systolic (congestive) heart failure: Secondary | ICD-10-CM | POA: Diagnosis not present

## 2019-06-18 DIAGNOSIS — I5023 Acute on chronic systolic (congestive) heart failure: Secondary | ICD-10-CM | POA: Diagnosis not present

## 2019-06-18 DIAGNOSIS — I110 Hypertensive heart disease with heart failure: Secondary | ICD-10-CM | POA: Diagnosis not present

## 2019-06-18 DIAGNOSIS — M1990 Unspecified osteoarthritis, unspecified site: Secondary | ICD-10-CM | POA: Diagnosis not present

## 2019-06-18 DIAGNOSIS — I480 Paroxysmal atrial fibrillation: Secondary | ICD-10-CM | POA: Diagnosis not present

## 2019-06-18 DIAGNOSIS — I69318 Other symptoms and signs involving cognitive functions following cerebral infarction: Secondary | ICD-10-CM | POA: Diagnosis not present

## 2019-06-18 DIAGNOSIS — Z853 Personal history of malignant neoplasm of breast: Secondary | ICD-10-CM | POA: Diagnosis not present

## 2019-06-19 DIAGNOSIS — Z853 Personal history of malignant neoplasm of breast: Secondary | ICD-10-CM | POA: Diagnosis not present

## 2019-06-19 DIAGNOSIS — I69318 Other symptoms and signs involving cognitive functions following cerebral infarction: Secondary | ICD-10-CM | POA: Diagnosis not present

## 2019-06-19 DIAGNOSIS — I480 Paroxysmal atrial fibrillation: Secondary | ICD-10-CM | POA: Diagnosis not present

## 2019-06-19 DIAGNOSIS — M1990 Unspecified osteoarthritis, unspecified site: Secondary | ICD-10-CM | POA: Diagnosis not present

## 2019-06-19 DIAGNOSIS — I110 Hypertensive heart disease with heart failure: Secondary | ICD-10-CM | POA: Diagnosis not present

## 2019-06-19 DIAGNOSIS — I5023 Acute on chronic systolic (congestive) heart failure: Secondary | ICD-10-CM | POA: Diagnosis not present

## 2019-06-22 DIAGNOSIS — Z853 Personal history of malignant neoplasm of breast: Secondary | ICD-10-CM | POA: Diagnosis not present

## 2019-06-22 DIAGNOSIS — I5023 Acute on chronic systolic (congestive) heart failure: Secondary | ICD-10-CM | POA: Diagnosis not present

## 2019-06-22 DIAGNOSIS — I69318 Other symptoms and signs involving cognitive functions following cerebral infarction: Secondary | ICD-10-CM | POA: Diagnosis not present

## 2019-06-22 DIAGNOSIS — I110 Hypertensive heart disease with heart failure: Secondary | ICD-10-CM | POA: Diagnosis not present

## 2019-06-22 DIAGNOSIS — I480 Paroxysmal atrial fibrillation: Secondary | ICD-10-CM | POA: Diagnosis not present

## 2019-06-22 DIAGNOSIS — M1990 Unspecified osteoarthritis, unspecified site: Secondary | ICD-10-CM | POA: Diagnosis not present

## 2019-06-24 DIAGNOSIS — M1990 Unspecified osteoarthritis, unspecified site: Secondary | ICD-10-CM | POA: Diagnosis not present

## 2019-06-24 DIAGNOSIS — Z853 Personal history of malignant neoplasm of breast: Secondary | ICD-10-CM | POA: Diagnosis not present

## 2019-06-24 DIAGNOSIS — I110 Hypertensive heart disease with heart failure: Secondary | ICD-10-CM | POA: Diagnosis not present

## 2019-06-24 DIAGNOSIS — I480 Paroxysmal atrial fibrillation: Secondary | ICD-10-CM | POA: Diagnosis not present

## 2019-06-24 DIAGNOSIS — I69318 Other symptoms and signs involving cognitive functions following cerebral infarction: Secondary | ICD-10-CM | POA: Diagnosis not present

## 2019-06-24 DIAGNOSIS — I5023 Acute on chronic systolic (congestive) heart failure: Secondary | ICD-10-CM | POA: Diagnosis not present

## 2019-06-25 DIAGNOSIS — I69318 Other symptoms and signs involving cognitive functions following cerebral infarction: Secondary | ICD-10-CM | POA: Diagnosis not present

## 2019-06-25 DIAGNOSIS — I480 Paroxysmal atrial fibrillation: Secondary | ICD-10-CM | POA: Diagnosis not present

## 2019-06-25 DIAGNOSIS — Z853 Personal history of malignant neoplasm of breast: Secondary | ICD-10-CM | POA: Diagnosis not present

## 2019-06-25 DIAGNOSIS — M1990 Unspecified osteoarthritis, unspecified site: Secondary | ICD-10-CM | POA: Diagnosis not present

## 2019-06-25 DIAGNOSIS — I5023 Acute on chronic systolic (congestive) heart failure: Secondary | ICD-10-CM | POA: Diagnosis not present

## 2019-06-25 DIAGNOSIS — I110 Hypertensive heart disease with heart failure: Secondary | ICD-10-CM | POA: Diagnosis not present

## 2019-06-26 DIAGNOSIS — Z853 Personal history of malignant neoplasm of breast: Secondary | ICD-10-CM | POA: Diagnosis not present

## 2019-06-26 DIAGNOSIS — I69318 Other symptoms and signs involving cognitive functions following cerebral infarction: Secondary | ICD-10-CM | POA: Diagnosis not present

## 2019-06-26 DIAGNOSIS — I110 Hypertensive heart disease with heart failure: Secondary | ICD-10-CM | POA: Diagnosis not present

## 2019-06-26 DIAGNOSIS — I5023 Acute on chronic systolic (congestive) heart failure: Secondary | ICD-10-CM | POA: Diagnosis not present

## 2019-06-26 DIAGNOSIS — M1990 Unspecified osteoarthritis, unspecified site: Secondary | ICD-10-CM | POA: Diagnosis not present

## 2019-06-26 DIAGNOSIS — I480 Paroxysmal atrial fibrillation: Secondary | ICD-10-CM | POA: Diagnosis not present

## 2019-06-29 DIAGNOSIS — I69318 Other symptoms and signs involving cognitive functions following cerebral infarction: Secondary | ICD-10-CM | POA: Diagnosis not present

## 2019-06-29 DIAGNOSIS — Z853 Personal history of malignant neoplasm of breast: Secondary | ICD-10-CM | POA: Diagnosis not present

## 2019-06-29 DIAGNOSIS — I110 Hypertensive heart disease with heart failure: Secondary | ICD-10-CM | POA: Diagnosis not present

## 2019-06-29 DIAGNOSIS — I480 Paroxysmal atrial fibrillation: Secondary | ICD-10-CM | POA: Diagnosis not present

## 2019-06-29 DIAGNOSIS — M1990 Unspecified osteoarthritis, unspecified site: Secondary | ICD-10-CM | POA: Diagnosis not present

## 2019-06-29 DIAGNOSIS — I5023 Acute on chronic systolic (congestive) heart failure: Secondary | ICD-10-CM | POA: Diagnosis not present

## 2019-06-30 DIAGNOSIS — I480 Paroxysmal atrial fibrillation: Secondary | ICD-10-CM | POA: Diagnosis not present

## 2019-06-30 DIAGNOSIS — M1990 Unspecified osteoarthritis, unspecified site: Secondary | ICD-10-CM | POA: Diagnosis not present

## 2019-06-30 DIAGNOSIS — I5023 Acute on chronic systolic (congestive) heart failure: Secondary | ICD-10-CM | POA: Diagnosis not present

## 2019-06-30 DIAGNOSIS — I69318 Other symptoms and signs involving cognitive functions following cerebral infarction: Secondary | ICD-10-CM | POA: Diagnosis not present

## 2019-06-30 DIAGNOSIS — I110 Hypertensive heart disease with heart failure: Secondary | ICD-10-CM | POA: Diagnosis not present

## 2019-06-30 DIAGNOSIS — Z853 Personal history of malignant neoplasm of breast: Secondary | ICD-10-CM | POA: Diagnosis not present

## 2019-07-01 DIAGNOSIS — Z853 Personal history of malignant neoplasm of breast: Secondary | ICD-10-CM | POA: Diagnosis not present

## 2019-07-01 DIAGNOSIS — I110 Hypertensive heart disease with heart failure: Secondary | ICD-10-CM | POA: Diagnosis not present

## 2019-07-01 DIAGNOSIS — M1990 Unspecified osteoarthritis, unspecified site: Secondary | ICD-10-CM | POA: Diagnosis not present

## 2019-07-01 DIAGNOSIS — I480 Paroxysmal atrial fibrillation: Secondary | ICD-10-CM | POA: Diagnosis not present

## 2019-07-01 DIAGNOSIS — I5023 Acute on chronic systolic (congestive) heart failure: Secondary | ICD-10-CM | POA: Diagnosis not present

## 2019-07-01 DIAGNOSIS — I69318 Other symptoms and signs involving cognitive functions following cerebral infarction: Secondary | ICD-10-CM | POA: Diagnosis not present

## 2019-07-03 DIAGNOSIS — I69318 Other symptoms and signs involving cognitive functions following cerebral infarction: Secondary | ICD-10-CM | POA: Diagnosis not present

## 2019-07-03 DIAGNOSIS — Z853 Personal history of malignant neoplasm of breast: Secondary | ICD-10-CM | POA: Diagnosis not present

## 2019-07-03 DIAGNOSIS — I5023 Acute on chronic systolic (congestive) heart failure: Secondary | ICD-10-CM | POA: Diagnosis not present

## 2019-07-03 DIAGNOSIS — I480 Paroxysmal atrial fibrillation: Secondary | ICD-10-CM | POA: Diagnosis not present

## 2019-07-03 DIAGNOSIS — M1990 Unspecified osteoarthritis, unspecified site: Secondary | ICD-10-CM | POA: Diagnosis not present

## 2019-07-03 DIAGNOSIS — I110 Hypertensive heart disease with heart failure: Secondary | ICD-10-CM | POA: Diagnosis not present

## 2019-07-06 DIAGNOSIS — I5023 Acute on chronic systolic (congestive) heart failure: Secondary | ICD-10-CM | POA: Diagnosis not present

## 2019-07-06 DIAGNOSIS — I69318 Other symptoms and signs involving cognitive functions following cerebral infarction: Secondary | ICD-10-CM | POA: Diagnosis not present

## 2019-07-06 DIAGNOSIS — Z853 Personal history of malignant neoplasm of breast: Secondary | ICD-10-CM | POA: Diagnosis not present

## 2019-07-06 DIAGNOSIS — M1990 Unspecified osteoarthritis, unspecified site: Secondary | ICD-10-CM | POA: Diagnosis not present

## 2019-07-06 DIAGNOSIS — I110 Hypertensive heart disease with heart failure: Secondary | ICD-10-CM | POA: Diagnosis not present

## 2019-07-06 DIAGNOSIS — I480 Paroxysmal atrial fibrillation: Secondary | ICD-10-CM | POA: Diagnosis not present

## 2019-07-07 DIAGNOSIS — Z853 Personal history of malignant neoplasm of breast: Secondary | ICD-10-CM | POA: Diagnosis not present

## 2019-07-07 DIAGNOSIS — I69318 Other symptoms and signs involving cognitive functions following cerebral infarction: Secondary | ICD-10-CM | POA: Diagnosis not present

## 2019-07-07 DIAGNOSIS — M1990 Unspecified osteoarthritis, unspecified site: Secondary | ICD-10-CM | POA: Diagnosis not present

## 2019-07-07 DIAGNOSIS — I480 Paroxysmal atrial fibrillation: Secondary | ICD-10-CM | POA: Diagnosis not present

## 2019-07-07 DIAGNOSIS — I110 Hypertensive heart disease with heart failure: Secondary | ICD-10-CM | POA: Diagnosis not present

## 2019-07-07 DIAGNOSIS — I5023 Acute on chronic systolic (congestive) heart failure: Secondary | ICD-10-CM | POA: Diagnosis not present

## 2019-07-08 DIAGNOSIS — Z853 Personal history of malignant neoplasm of breast: Secondary | ICD-10-CM | POA: Diagnosis not present

## 2019-07-08 DIAGNOSIS — I480 Paroxysmal atrial fibrillation: Secondary | ICD-10-CM | POA: Diagnosis not present

## 2019-07-08 DIAGNOSIS — M1990 Unspecified osteoarthritis, unspecified site: Secondary | ICD-10-CM | POA: Diagnosis not present

## 2019-07-08 DIAGNOSIS — I69318 Other symptoms and signs involving cognitive functions following cerebral infarction: Secondary | ICD-10-CM | POA: Diagnosis not present

## 2019-07-08 DIAGNOSIS — I5023 Acute on chronic systolic (congestive) heart failure: Secondary | ICD-10-CM | POA: Diagnosis not present

## 2019-07-08 DIAGNOSIS — I110 Hypertensive heart disease with heart failure: Secondary | ICD-10-CM | POA: Diagnosis not present

## 2019-07-10 DIAGNOSIS — I5023 Acute on chronic systolic (congestive) heart failure: Secondary | ICD-10-CM | POA: Diagnosis not present

## 2019-07-10 DIAGNOSIS — I69318 Other symptoms and signs involving cognitive functions following cerebral infarction: Secondary | ICD-10-CM | POA: Diagnosis not present

## 2019-07-10 DIAGNOSIS — M1990 Unspecified osteoarthritis, unspecified site: Secondary | ICD-10-CM | POA: Diagnosis not present

## 2019-07-10 DIAGNOSIS — Z853 Personal history of malignant neoplasm of breast: Secondary | ICD-10-CM | POA: Diagnosis not present

## 2019-07-10 DIAGNOSIS — I480 Paroxysmal atrial fibrillation: Secondary | ICD-10-CM | POA: Diagnosis not present

## 2019-07-10 DIAGNOSIS — I110 Hypertensive heart disease with heart failure: Secondary | ICD-10-CM | POA: Diagnosis not present

## 2019-07-13 DIAGNOSIS — I5023 Acute on chronic systolic (congestive) heart failure: Secondary | ICD-10-CM | POA: Diagnosis not present

## 2019-07-13 DIAGNOSIS — Z853 Personal history of malignant neoplasm of breast: Secondary | ICD-10-CM | POA: Diagnosis not present

## 2019-07-13 DIAGNOSIS — I69318 Other symptoms and signs involving cognitive functions following cerebral infarction: Secondary | ICD-10-CM | POA: Diagnosis not present

## 2019-07-13 DIAGNOSIS — I110 Hypertensive heart disease with heart failure: Secondary | ICD-10-CM | POA: Diagnosis not present

## 2019-07-13 DIAGNOSIS — I480 Paroxysmal atrial fibrillation: Secondary | ICD-10-CM | POA: Diagnosis not present

## 2019-07-13 DIAGNOSIS — M1990 Unspecified osteoarthritis, unspecified site: Secondary | ICD-10-CM | POA: Diagnosis not present

## 2019-07-15 DIAGNOSIS — M1990 Unspecified osteoarthritis, unspecified site: Secondary | ICD-10-CM | POA: Diagnosis not present

## 2019-07-15 DIAGNOSIS — Z853 Personal history of malignant neoplasm of breast: Secondary | ICD-10-CM | POA: Diagnosis not present

## 2019-07-15 DIAGNOSIS — I480 Paroxysmal atrial fibrillation: Secondary | ICD-10-CM | POA: Diagnosis not present

## 2019-07-15 DIAGNOSIS — I69318 Other symptoms and signs involving cognitive functions following cerebral infarction: Secondary | ICD-10-CM | POA: Diagnosis not present

## 2019-07-15 DIAGNOSIS — I110 Hypertensive heart disease with heart failure: Secondary | ICD-10-CM | POA: Diagnosis not present

## 2019-07-15 DIAGNOSIS — I5023 Acute on chronic systolic (congestive) heart failure: Secondary | ICD-10-CM | POA: Diagnosis not present

## 2019-07-17 DIAGNOSIS — R296 Repeated falls: Secondary | ICD-10-CM | POA: Diagnosis not present

## 2019-07-17 DIAGNOSIS — I5023 Acute on chronic systolic (congestive) heart failure: Secondary | ICD-10-CM | POA: Diagnosis not present

## 2019-07-17 DIAGNOSIS — Z9981 Dependence on supplemental oxygen: Secondary | ICD-10-CM | POA: Diagnosis not present

## 2019-07-17 DIAGNOSIS — I69318 Other symptoms and signs involving cognitive functions following cerebral infarction: Secondary | ICD-10-CM | POA: Diagnosis not present

## 2019-07-17 DIAGNOSIS — I110 Hypertensive heart disease with heart failure: Secondary | ICD-10-CM | POA: Diagnosis not present

## 2019-07-17 DIAGNOSIS — Z7401 Bed confinement status: Secondary | ICD-10-CM | POA: Diagnosis not present

## 2019-07-17 DIAGNOSIS — Z853 Personal history of malignant neoplasm of breast: Secondary | ICD-10-CM | POA: Diagnosis not present

## 2019-07-17 DIAGNOSIS — Z741 Need for assistance with personal care: Secondary | ICD-10-CM | POA: Diagnosis not present

## 2019-07-17 DIAGNOSIS — R443 Hallucinations, unspecified: Secondary | ICD-10-CM | POA: Diagnosis not present

## 2019-07-17 DIAGNOSIS — R32 Unspecified urinary incontinence: Secondary | ICD-10-CM | POA: Diagnosis not present

## 2019-07-17 DIAGNOSIS — Z960 Presence of urogenital implants: Secondary | ICD-10-CM | POA: Diagnosis not present

## 2019-07-17 DIAGNOSIS — I480 Paroxysmal atrial fibrillation: Secondary | ICD-10-CM | POA: Diagnosis not present

## 2019-07-17 DIAGNOSIS — M1990 Unspecified osteoarthritis, unspecified site: Secondary | ICD-10-CM | POA: Diagnosis not present

## 2019-07-17 DIAGNOSIS — S0093XD Contusion of unspecified part of head, subsequent encounter: Secondary | ICD-10-CM | POA: Diagnosis not present

## 2019-07-19 DIAGNOSIS — S0093XD Contusion of unspecified part of head, subsequent encounter: Secondary | ICD-10-CM | POA: Diagnosis not present

## 2019-07-19 DIAGNOSIS — I110 Hypertensive heart disease with heart failure: Secondary | ICD-10-CM | POA: Diagnosis not present

## 2019-07-19 DIAGNOSIS — M1990 Unspecified osteoarthritis, unspecified site: Secondary | ICD-10-CM | POA: Diagnosis not present

## 2019-07-19 DIAGNOSIS — I5023 Acute on chronic systolic (congestive) heart failure: Secondary | ICD-10-CM | POA: Diagnosis not present

## 2019-07-19 DIAGNOSIS — I480 Paroxysmal atrial fibrillation: Secondary | ICD-10-CM | POA: Diagnosis not present

## 2019-07-19 DIAGNOSIS — I69318 Other symptoms and signs involving cognitive functions following cerebral infarction: Secondary | ICD-10-CM | POA: Diagnosis not present

## 2019-07-20 DIAGNOSIS — I69318 Other symptoms and signs involving cognitive functions following cerebral infarction: Secondary | ICD-10-CM | POA: Diagnosis not present

## 2019-07-20 DIAGNOSIS — I110 Hypertensive heart disease with heart failure: Secondary | ICD-10-CM | POA: Diagnosis not present

## 2019-07-20 DIAGNOSIS — S0093XD Contusion of unspecified part of head, subsequent encounter: Secondary | ICD-10-CM | POA: Diagnosis not present

## 2019-07-20 DIAGNOSIS — M1990 Unspecified osteoarthritis, unspecified site: Secondary | ICD-10-CM | POA: Diagnosis not present

## 2019-07-20 DIAGNOSIS — I480 Paroxysmal atrial fibrillation: Secondary | ICD-10-CM | POA: Diagnosis not present

## 2019-07-20 DIAGNOSIS — I5023 Acute on chronic systolic (congestive) heart failure: Secondary | ICD-10-CM | POA: Diagnosis not present

## 2019-07-21 DIAGNOSIS — M1990 Unspecified osteoarthritis, unspecified site: Secondary | ICD-10-CM | POA: Diagnosis not present

## 2019-07-21 DIAGNOSIS — I480 Paroxysmal atrial fibrillation: Secondary | ICD-10-CM | POA: Diagnosis not present

## 2019-07-21 DIAGNOSIS — S0093XD Contusion of unspecified part of head, subsequent encounter: Secondary | ICD-10-CM | POA: Diagnosis not present

## 2019-07-21 DIAGNOSIS — I5023 Acute on chronic systolic (congestive) heart failure: Secondary | ICD-10-CM | POA: Diagnosis not present

## 2019-07-21 DIAGNOSIS — I110 Hypertensive heart disease with heart failure: Secondary | ICD-10-CM | POA: Diagnosis not present

## 2019-07-21 DIAGNOSIS — I69318 Other symptoms and signs involving cognitive functions following cerebral infarction: Secondary | ICD-10-CM | POA: Diagnosis not present

## 2019-07-22 DIAGNOSIS — I480 Paroxysmal atrial fibrillation: Secondary | ICD-10-CM | POA: Diagnosis not present

## 2019-07-22 DIAGNOSIS — S0093XD Contusion of unspecified part of head, subsequent encounter: Secondary | ICD-10-CM | POA: Diagnosis not present

## 2019-07-22 DIAGNOSIS — M1990 Unspecified osteoarthritis, unspecified site: Secondary | ICD-10-CM | POA: Diagnosis not present

## 2019-07-22 DIAGNOSIS — I5023 Acute on chronic systolic (congestive) heart failure: Secondary | ICD-10-CM | POA: Diagnosis not present

## 2019-07-22 DIAGNOSIS — I110 Hypertensive heart disease with heart failure: Secondary | ICD-10-CM | POA: Diagnosis not present

## 2019-07-22 DIAGNOSIS — I69318 Other symptoms and signs involving cognitive functions following cerebral infarction: Secondary | ICD-10-CM | POA: Diagnosis not present

## 2019-07-23 DIAGNOSIS — I110 Hypertensive heart disease with heart failure: Secondary | ICD-10-CM | POA: Diagnosis not present

## 2019-07-23 DIAGNOSIS — I480 Paroxysmal atrial fibrillation: Secondary | ICD-10-CM | POA: Diagnosis not present

## 2019-07-23 DIAGNOSIS — I69318 Other symptoms and signs involving cognitive functions following cerebral infarction: Secondary | ICD-10-CM | POA: Diagnosis not present

## 2019-07-23 DIAGNOSIS — S0093XD Contusion of unspecified part of head, subsequent encounter: Secondary | ICD-10-CM | POA: Diagnosis not present

## 2019-07-23 DIAGNOSIS — I5023 Acute on chronic systolic (congestive) heart failure: Secondary | ICD-10-CM | POA: Diagnosis not present

## 2019-07-23 DIAGNOSIS — M1990 Unspecified osteoarthritis, unspecified site: Secondary | ICD-10-CM | POA: Diagnosis not present

## 2019-07-24 DIAGNOSIS — M1990 Unspecified osteoarthritis, unspecified site: Secondary | ICD-10-CM | POA: Diagnosis not present

## 2019-07-24 DIAGNOSIS — I480 Paroxysmal atrial fibrillation: Secondary | ICD-10-CM | POA: Diagnosis not present

## 2019-07-24 DIAGNOSIS — I69318 Other symptoms and signs involving cognitive functions following cerebral infarction: Secondary | ICD-10-CM | POA: Diagnosis not present

## 2019-07-24 DIAGNOSIS — I110 Hypertensive heart disease with heart failure: Secondary | ICD-10-CM | POA: Diagnosis not present

## 2019-07-24 DIAGNOSIS — I5023 Acute on chronic systolic (congestive) heart failure: Secondary | ICD-10-CM | POA: Diagnosis not present

## 2019-07-24 DIAGNOSIS — S0093XD Contusion of unspecified part of head, subsequent encounter: Secondary | ICD-10-CM | POA: Diagnosis not present

## 2019-07-25 DIAGNOSIS — S0093XD Contusion of unspecified part of head, subsequent encounter: Secondary | ICD-10-CM | POA: Diagnosis not present

## 2019-07-25 DIAGNOSIS — M1990 Unspecified osteoarthritis, unspecified site: Secondary | ICD-10-CM | POA: Diagnosis not present

## 2019-07-25 DIAGNOSIS — I5023 Acute on chronic systolic (congestive) heart failure: Secondary | ICD-10-CM | POA: Diagnosis not present

## 2019-07-25 DIAGNOSIS — I69318 Other symptoms and signs involving cognitive functions following cerebral infarction: Secondary | ICD-10-CM | POA: Diagnosis not present

## 2019-07-25 DIAGNOSIS — I480 Paroxysmal atrial fibrillation: Secondary | ICD-10-CM | POA: Diagnosis not present

## 2019-07-25 DIAGNOSIS — I110 Hypertensive heart disease with heart failure: Secondary | ICD-10-CM | POA: Diagnosis not present

## 2019-08-17 DEATH — deceased

## 2019-08-20 IMAGING — CT CT ABD-PELV W/ CM
2 of 5 series · 15 of 46 positions shown, 17 images · IV contrast (APPLIED)
Comparison: 10/09/2017 lumbar spine radiographs.

CLINICAL DATA: 89 y/o F; tenderness to the abdomen, constipation,
hemorrhoids.

EXAM:
CT ABDOMEN AND PELVIS WITH CONTRAST
TECHNIQUE: Multidetector CT imaging of the abdomen and pelvis was performed
using the standard protocol following bolus administration of
intravenous contrast.
CONTRAST:  100mL OMNIPAQUE IOHEXOL 300 MG/ML  SOLN

[Series 3: abdomen 5.0 · axial · 0.81mm/px · z∈[+895,+1270]mm · 12 of 89 slices shown, 14 images]
[im 7/89  soft-tissue]
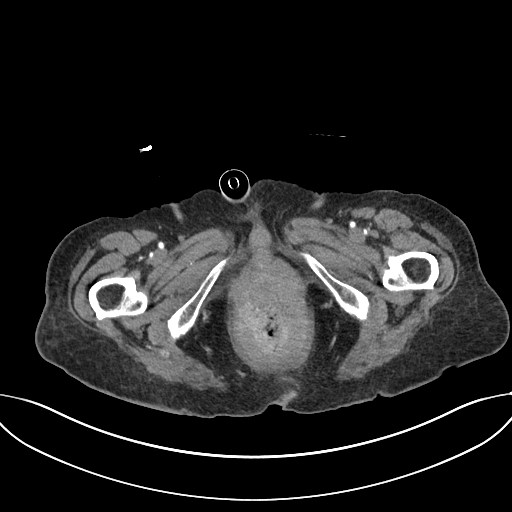
[im 7/89  bone]
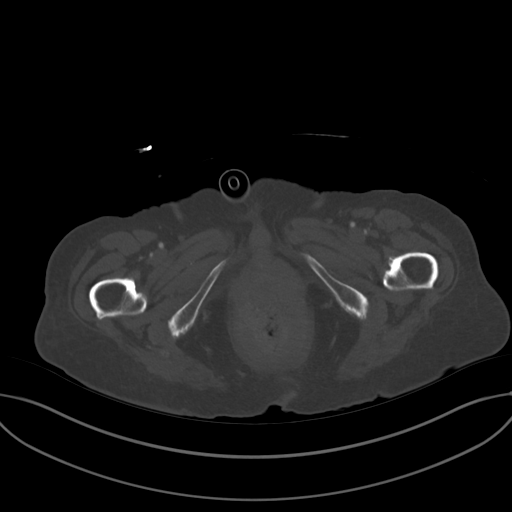
[im 14/89  soft-tissue]
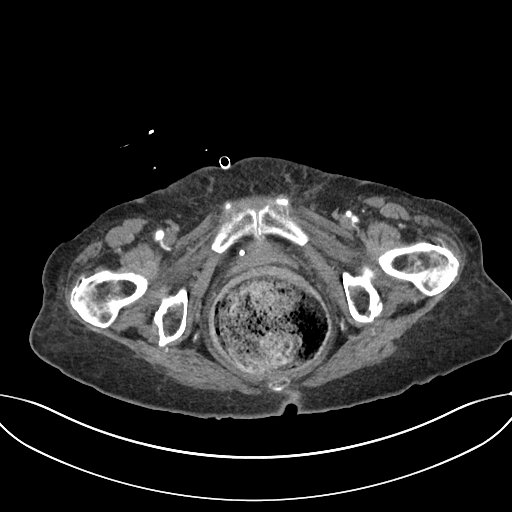
[im 21/89  soft-tissue]
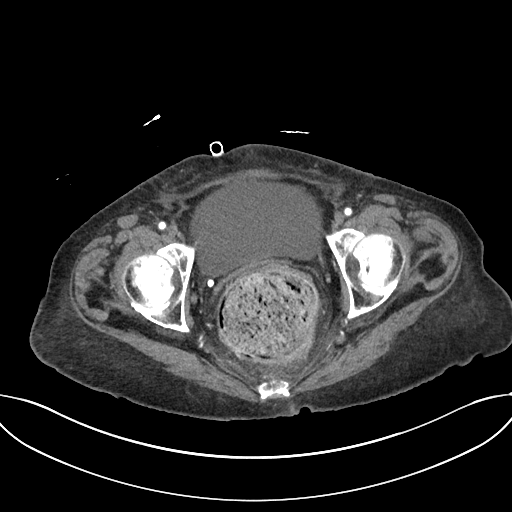
[im 28/89  soft-tissue]
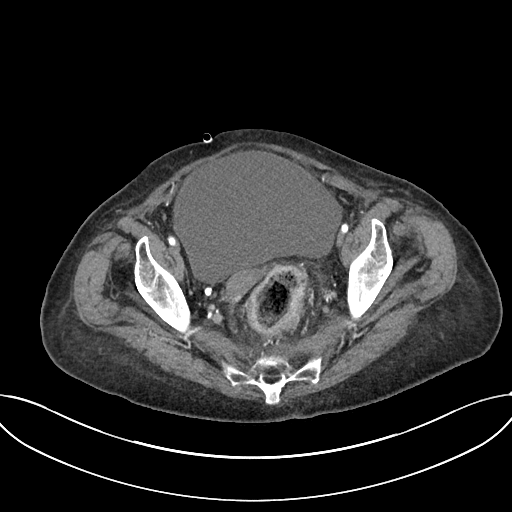
[im 34/89  soft-tissue]
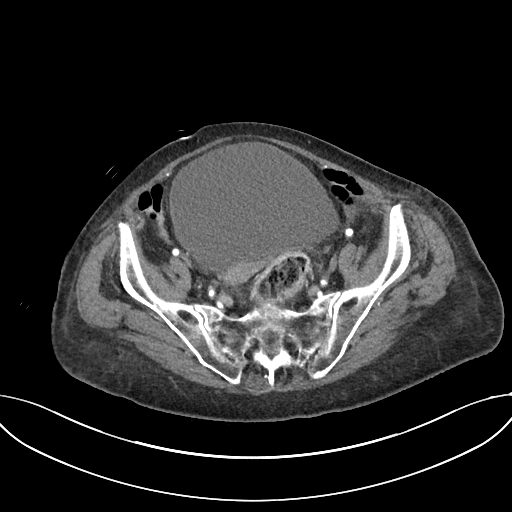
[im 41/89  soft-tissue]
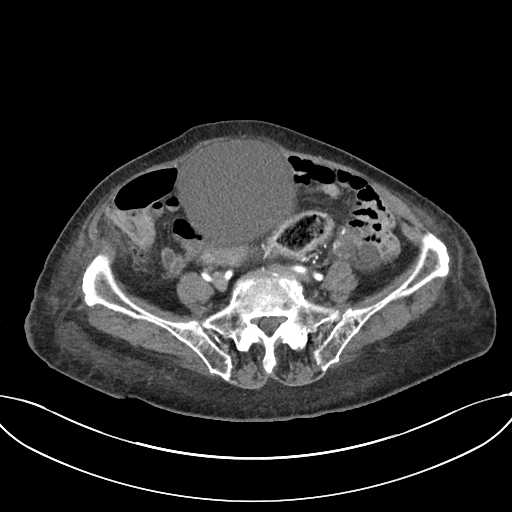
[im 48/89  soft-tissue]
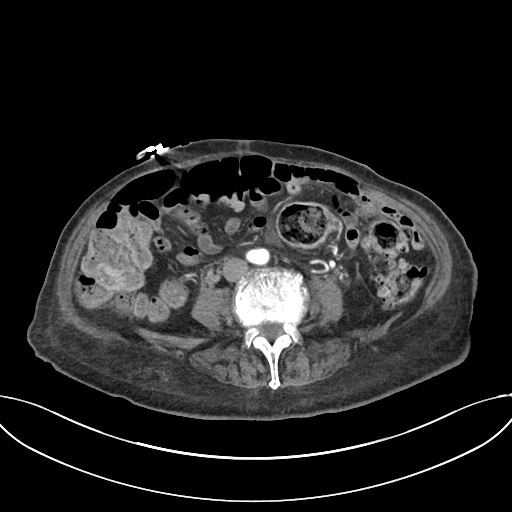
[im 55/89  soft-tissue]
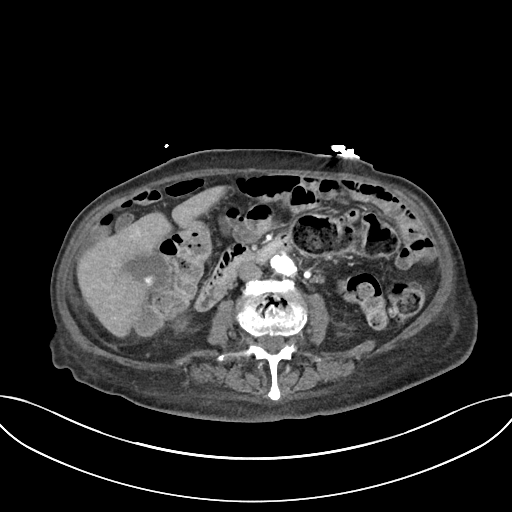
[im 61/89  soft-tissue]
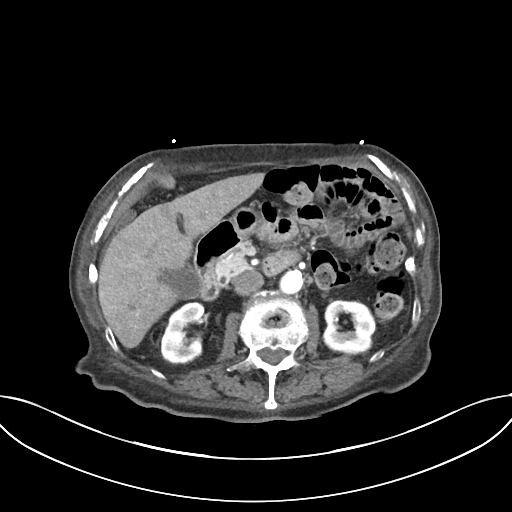
[im 61/89  bone]
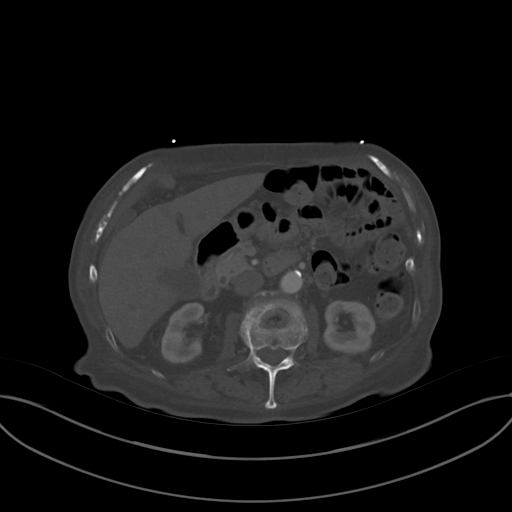
[im 68/89  soft-tissue]
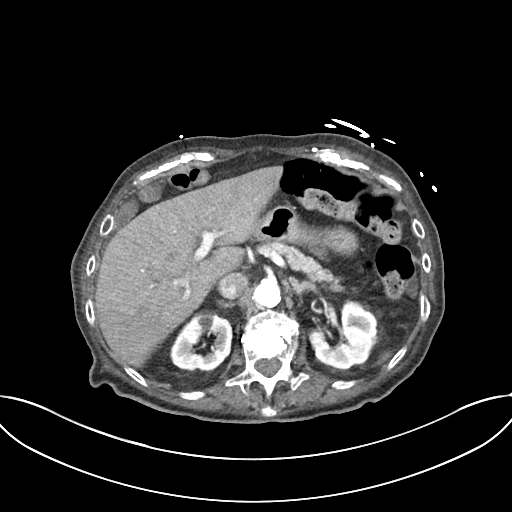
[im 75/89  soft-tissue]
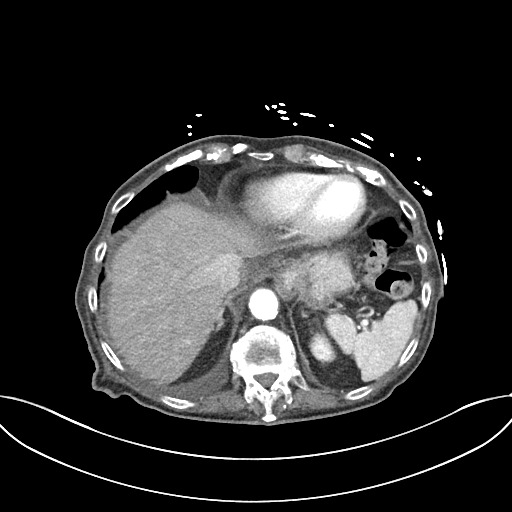
[im 82/89  soft-tissue]
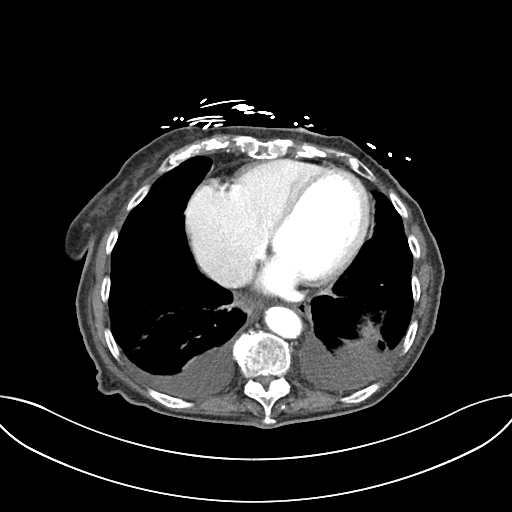

[Series 6: abdomen 3.0 mpr cor · coronal · 0.68mm/px · 3 of 79 slices shown]
[im 27/79  soft-tissue]
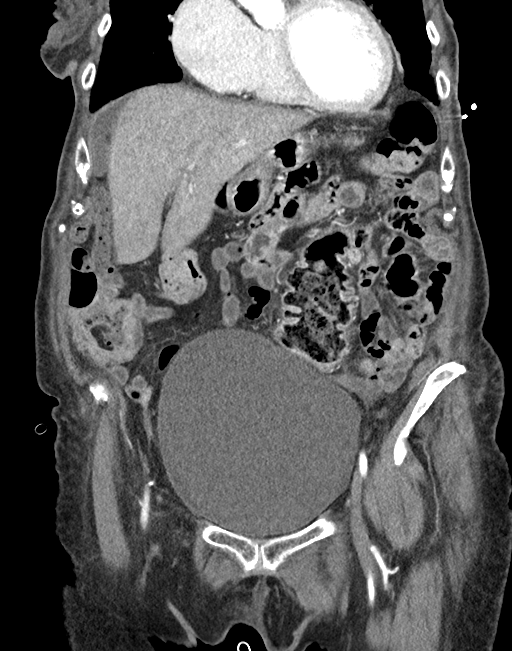
[im 35/79  soft-tissue]
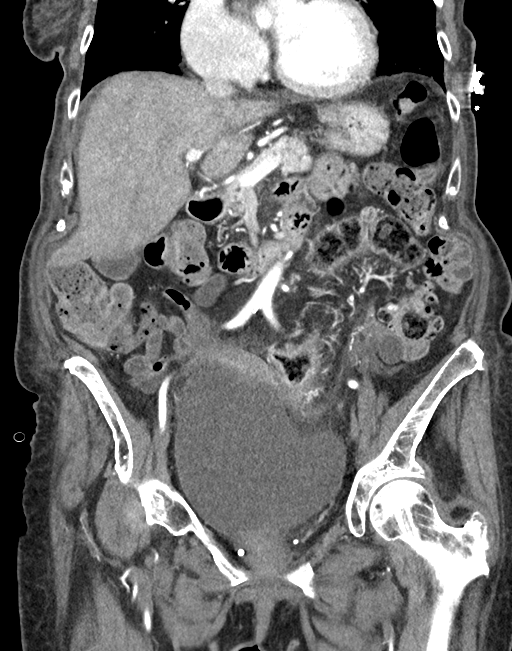
[im 44/79  soft-tissue]
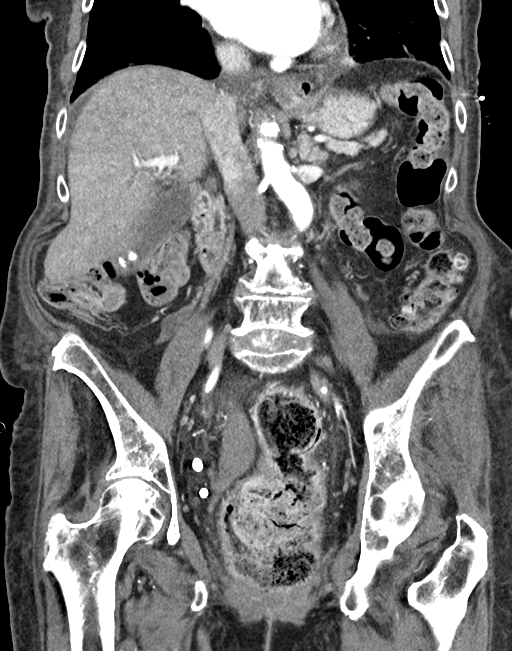

[15 of 46 positions shown; findings below may reference images not displayed]

FINDINGS: Lower chest: Small bilateral pleural effusions. Minor dependent
atelectasis of the lower lobes. Moderate to severe cardiomegaly.
Mild interlobular septal thickening at the lung bases.

Hepatobiliary: No focal liver abnormality is seen. Mild periportal
edema, probably related to congestive heart failure. Cholelithiasis,
no gallbladder wall thickening, and no biliary dilatation.

Pancreas: Unremarkable. No pancreatic ductal dilatation or
surrounding inflammatory changes.

Spleen: Normal in size without focal abnormality.

Adrenals/Urinary Tract: Adrenal glands are unremarkable. Right
kidney interpolar 18 mm simple cyst. Additional tiny cortical
lucencies in the kidneys bilaterally likely representing small
cysts. Otherwise kidneys are normal, without renal calculi, focal
lesion, or hydronephrosis. Bladder is distended.

Stomach/Bowel: Large volume of stool in the rectum with wall
thickening and perirectal edema. No additional obstructive or
inflammatory changes of the bowel. Sigmoid diverticulosis without
findings of acute diverticulitis. Appendix not identified, no
pericecal inflammation.

Vascular/Lymphatic: Aortic atherosclerosis. No enlarged abdominal or
pelvic lymph nodes.

Reproductive: Uterus and bilateral adnexa are unremarkable.

Other: No abdominal wall hernia or abnormality. No abdominopelvic
ascites.

Musculoskeletal: Stable moderate T12, mild L1, mild L2, mild L3, and
mild L5 compression deformities. Interval mild L4 compression
deformity, age indeterminate. Post augmentation of T12 and L1.
IMPRESSION: 1. Large volume of stool in the rectum with wall thickening and
perirectal edema may represent stercoral colitis.
2. Small bilateral pleural effusions. Mild interstitial edema.
Moderate to severe cardiomegaly.
3. Cholelithiasis.
4. Sigmoid diverticulosis without findings of acute diverticulitis.
5. Interval mild L4 compression deformity, age indeterminate.
6. Aortic Atherosclerosis ([id number]).
